# Patient Record
Sex: Male | Born: 1935
Health system: Southern US, Community
[De-identification: ages and names within clinical notes are randomized; demographics above are authoritative.]

## PROBLEM LIST (undated history)

## (undated) DIAGNOSIS — E7800 Pure hypercholesterolemia, unspecified: Secondary | ICD-10-CM

## (undated) DIAGNOSIS — F329 Major depressive disorder, single episode, unspecified: Secondary | ICD-10-CM

## (undated) DIAGNOSIS — R251 Tremor, unspecified: Secondary | ICD-10-CM

## (undated) DIAGNOSIS — N400 Enlarged prostate without lower urinary tract symptoms: Secondary | ICD-10-CM

## (undated) DIAGNOSIS — C61 Malignant neoplasm of prostate: Secondary | ICD-10-CM

## (undated) DIAGNOSIS — M1990 Unspecified osteoarthritis, unspecified site: Secondary | ICD-10-CM

## (undated) DIAGNOSIS — E785 Hyperlipidemia, unspecified: Secondary | ICD-10-CM

## (undated) DIAGNOSIS — F32A Depression, unspecified: Secondary | ICD-10-CM

## (undated) DIAGNOSIS — D126 Benign neoplasm of colon, unspecified: Secondary | ICD-10-CM

## (undated) DIAGNOSIS — F419 Anxiety disorder, unspecified: Secondary | ICD-10-CM

## (undated) DIAGNOSIS — I480 Paroxysmal atrial fibrillation: Secondary | ICD-10-CM

## (undated) DIAGNOSIS — K5790 Diverticulosis of intestine, part unspecified, without perforation or abscess without bleeding: Secondary | ICD-10-CM

## (undated) DIAGNOSIS — G20 Parkinson's disease: Secondary | ICD-10-CM

## (undated) HISTORY — DX: Major depressive disorder, single episode, unspecified: F32.9

## (undated) HISTORY — DX: Diverticulosis of intestine, part unspecified, without perforation or abscess without bleeding: K57.90

## (undated) HISTORY — DX: Parkinson's disease: G20

## (undated) HISTORY — DX: Benign neoplasm of colon, unspecified: D12.6

## (undated) HISTORY — DX: Malignant neoplasm of prostate: C61

## (undated) HISTORY — DX: Tremor, unspecified: R25.1

## (undated) HISTORY — DX: Anxiety disorder, unspecified: F41.9

## (undated) HISTORY — PX: ATRIAL FIBRILLATION ABLATION: EP1191

## (undated) HISTORY — DX: Hyperlipidemia, unspecified: E78.5

## (undated) HISTORY — DX: Depression, unspecified: F32.A

## (undated) HISTORY — PX: TOTAL KNEE ARTHROPLASTY: SHX125

## (undated) HISTORY — DX: Paroxysmal atrial fibrillation: I48.0

## (undated) HISTORY — DX: Pure hypercholesterolemia, unspecified: E78.00

## (undated) HISTORY — DX: Benign prostatic hyperplasia without lower urinary tract symptoms: N40.0

## (undated) HISTORY — DX: Unspecified osteoarthritis, unspecified site: M19.90

---
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
## 1990-06-29 HISTORY — PX: HERNIA REPAIR: SHX51

## 1998-03-26 ENCOUNTER — Emergency Department (HOSPITAL_COMMUNITY): Admission: EM | Admit: 1998-03-26 | Discharge: 1998-03-26 | Payer: Self-pay | Admitting: Emergency Medicine

## 1999-01-27 ENCOUNTER — Ambulatory Visit (HOSPITAL_COMMUNITY): Admission: RE | Admit: 1999-01-27 | Discharge: 1999-01-27 | Payer: Self-pay | Admitting: Internal Medicine

## 2000-11-25 ENCOUNTER — Encounter: Payer: Self-pay | Admitting: Orthopaedic Surgery

## 2000-11-25 ENCOUNTER — Encounter: Admission: RE | Admit: 2000-11-25 | Discharge: 2000-11-25 | Payer: Self-pay | Admitting: Orthopaedic Surgery

## 2003-06-30 HISTORY — PX: CARDIAC CATHETERIZATION: SHX172

## 2009-04-05 ENCOUNTER — Emergency Department (HOSPITAL_BASED_OUTPATIENT_CLINIC_OR_DEPARTMENT_OTHER): Admission: EM | Admit: 2009-04-05 | Discharge: 2009-04-05 | Payer: Self-pay | Admitting: Emergency Medicine

## 2009-04-05 ENCOUNTER — Ambulatory Visit: Payer: Self-pay | Admitting: Diagnostic Radiology

## 2011-03-04 ENCOUNTER — Encounter: Payer: Self-pay | Admitting: Internal Medicine

## 2011-04-16 ENCOUNTER — Encounter: Payer: Self-pay | Admitting: Internal Medicine

## 2011-04-16 ENCOUNTER — Ambulatory Visit (INDEPENDENT_AMBULATORY_CARE_PROVIDER_SITE_OTHER): Payer: Medicare Other | Admitting: Internal Medicine

## 2011-04-16 VITALS — BP 132/64 | HR 76 | Ht 71.0 in | Wt 181.0 lb

## 2011-04-16 DIAGNOSIS — R159 Full incontinence of feces: Secondary | ICD-10-CM

## 2011-04-16 DIAGNOSIS — R198 Other specified symptoms and signs involving the digestive system and abdomen: Secondary | ICD-10-CM

## 2011-04-16 DIAGNOSIS — Z86010 Personal history of colonic polyps: Secondary | ICD-10-CM

## 2011-04-16 MED ORDER — PEG-KCL-NACL-NASULF-NA ASC-C 100 G PO SOLR: 1.0000 | Freq: Once | ORAL | Status: DC

## 2011-04-16 NOTE — Patient Instructions (Signed)
[facility] 04/23/11 9:00 am arrive at 8:00 am on 4th floor Moviprep sent to your pharmacy Colon brochure given for you to read

## 2011-04-16 NOTE — Progress Notes (Signed)
HISTORY OF PRESENT ILLNESS:  Nathan Stanley is a 77 y.o. male with the below listed medical history. He presents today regarding change in bowel habits and the need for surveillance colonoscopy. He underwent colonoscopy previously in November of 2003. He was found to have severe pandiverticulosis as well as an adenomatous descending colon polyp which was removed. Recall letter sent to the patient in 2006. No response. He subsequently developed prostate cancer for which he underwent seed implantation. Thereafter, he reports alternating bowel habits consisting of constipation greater than diarrhea. She also reports intermittent fecal incontinence with mild smears. This occurs with Valsalva maneuver such as sneezing. He recently saw his primary care physician and inquired about his need for followup colonoscopy. The patient was interested. Despite his age and medical history, he remains quite active. He continues to work periodically, though formally retired.  REVIEW OF SYSTEMS:  All non-GI ROS negative except for occasional insomnia  Past Medical History  Diagnosis Date  . Prostate cancer   . Hyperlipidemia   . Osteoarthritis   . PAF (paroxysmal atrial fibrillation)   . BPH (benign prostatic hyperplasia)   . Hypercholesterolemia   . Osteoporosis   . Diverticulosis   . Adenomatous colon polyp   . Anxiety   . Depression   . Tremor     Past Surgical History  Procedure Date  . Hernia repair   . Amputation finger / thumb   . Total knee arthroplasty     left  . Total knee arthroplasty     rt    Social History Nathan Stanley  reports that he has never smoked. He has never used smokeless tobacco. He reports that he does not drink alcohol or use illicit drugs.  family history includes Lupus in his brother.  There is no history of Colon cancer.  No Known Allergies     PHYSICAL EXAMINATION: Vital signs: BP 132/64  Pulse 76  Ht 5\' 11"  (1.803 m)  Wt 181 lb (82.101 kg)  BMI 25.24 kg/m2    Constitutional: generally well-appearing, no acute distress Psychiatric: alert and oriented x3, cooperative Eyes: extraocular movements intact, anicteric, conjunctiva pink Mouth: oral pharynx moist, no lesions Neck: supple no lymphadenopathy Cardiovascular: heart regular rate and rhythm, no murmur Lungs: clear to auscultation bilaterally Abdomen: soft, nontender, nondistended, no obvious ascites, no peritoneal signs, normal bowel sounds, no organomegaly Rectal: Deferred until colonoscopy Extremities: no lower extremity edema bilaterally. Missing portion of right hand Skin: no lesions on visible extremities Neuro: No focal deficits.   ASSESSMENT:  #1. Mild fecal incontinence #2. Alternating bowel habits #3. History of adenomatous colon polyps. Overdue for surveillance #4. History of severe pandiverticulosis   PLAN:  #1. Colonoscopy to evaluate change in bowel habits, incontinence, and provide neoplasia surveillance. The patient is an appropriate candidate without contraindication.The nature of the procedure, as well as the risks, benefits, and alternatives were carefully and thoroughly reviewed with the patient. Ample time for discussion and questions allowed. The patient understood, was satisfied, and agreed to proceed. Movi prep prescribed. The patient instructed on its use. #2. If colonoscopy unrevealing for significant pathology, recommend bulking agents such as Metamucil or Citrucel. As well, protective undergarments (already recommended today)

## 2011-04-20 ENCOUNTER — Other Ambulatory Visit: Payer: Self-pay | Admitting: Diagnostic Neuroimaging

## 2011-04-20 DIAGNOSIS — G20 Parkinson's disease: Secondary | ICD-10-CM

## 2011-04-23 ENCOUNTER — Ambulatory Visit (AMBULATORY_SURGERY_CENTER): Payer: Medicare Other | Admitting: Internal Medicine

## 2011-04-23 ENCOUNTER — Encounter: Payer: Self-pay | Admitting: Internal Medicine

## 2011-04-23 VITALS — BP 128/71 | HR 74 | Temp 98.4°F | Resp 20 | Ht 71.0 in | Wt 181.0 lb

## 2011-04-23 DIAGNOSIS — Z1211 Encounter for screening for malignant neoplasm of colon: Secondary | ICD-10-CM

## 2011-04-23 DIAGNOSIS — Z86010 Personal history of colonic polyps: Secondary | ICD-10-CM

## 2011-04-23 DIAGNOSIS — K5730 Diverticulosis of large intestine without perforation or abscess without bleeding: Secondary | ICD-10-CM

## 2011-04-23 DIAGNOSIS — D126 Benign neoplasm of colon, unspecified: Secondary | ICD-10-CM

## 2011-04-23 MED ORDER — SODIUM CHLORIDE 0.9 % IV SOLN: 500.0000 mL | INTRAVENOUS | Status: DC

## 2011-04-23 NOTE — Progress Notes (Signed)
Initial bp 69/31 then recheck 76/31. Pt will respond to stimulation, sat 90% so started 02 at 2l via nasal cannula, skin warm and dry, no diaphoresis. Strong palpable pulse. Second bag ivf hung and pt resting comfortably. Denies any pain or discomfort. ewm  1036am pt positioned on his back and HOB elevated about 10 degrees and bp 93/52. Pt continues on 02 2liters via nasal cannula and still very drowsy. ewm  1051am bp 106/61 and more alert. pts head elevated slightly and more talkative to wife. Asking questions about eating and denies pain/discomfort. ewm  1100am dr Marina Goodell made aware of bp and no new orders given ewm  1110 am pts hob elevated more. Pt alert, drinking sprite and talkative .  bp maintaining in the 90's diastolic ewm  1135am dr Marina Goodell states pt may be discharged if supine and not dizzy. Pt elevated all the way and denies diazziness or pain/discomfort. ewm  1148 pt sat on side of bed with no problems. Denies dizziness or problems. Pt to dress to be discharged. ewm  1158am pt discharged with no problems or concerns,ewm

## 2011-04-23 NOTE — Patient Instructions (Signed)
Handouts given on polyps, diverticulosis, and high fiber diet  Discharge instructions per blue and green sheets  Start metamucil 2 tablespoons in 12-14 ounces water or juice once a day  We will mail you a letter in 1-2 weeks with the pathology results and dr perry's recommendations

## 2011-04-24 ENCOUNTER — Telehealth: Payer: Self-pay

## 2011-04-24 ENCOUNTER — Telehealth: Payer: Self-pay | Admitting: *Deleted

## 2011-04-24 NOTE — Telephone Encounter (Signed)
No answer

## 2011-04-24 NOTE — Telephone Encounter (Signed)
No ID on answering machine. 

## 2011-04-25 ENCOUNTER — Other Ambulatory Visit: Payer: Medicare Other

## 2011-07-03 DIAGNOSIS — J029 Acute pharyngitis, unspecified: Secondary | ICD-10-CM | POA: Diagnosis not present

## 2011-08-24 DIAGNOSIS — M546 Pain in thoracic spine: Secondary | ICD-10-CM | POA: Diagnosis not present

## 2011-08-24 DIAGNOSIS — M533 Sacrococcygeal disorders, not elsewhere classified: Secondary | ICD-10-CM | POA: Diagnosis not present

## 2011-08-24 DIAGNOSIS — M545 Low back pain: Secondary | ICD-10-CM | POA: Diagnosis not present

## 2011-08-24 DIAGNOSIS — M542 Cervicalgia: Secondary | ICD-10-CM | POA: Diagnosis not present

## 2011-10-12 DIAGNOSIS — M999 Biomechanical lesion, unspecified: Secondary | ICD-10-CM | POA: Diagnosis not present

## 2011-10-12 DIAGNOSIS — M5137 Other intervertebral disc degeneration, lumbosacral region: Secondary | ICD-10-CM | POA: Diagnosis not present

## 2011-10-12 DIAGNOSIS — M9981 Other biomechanical lesions of cervical region: Secondary | ICD-10-CM | POA: Diagnosis not present

## 2011-10-20 DIAGNOSIS — I4891 Unspecified atrial fibrillation: Secondary | ICD-10-CM | POA: Diagnosis not present

## 2011-10-20 DIAGNOSIS — M1990 Unspecified osteoarthritis, unspecified site: Secondary | ICD-10-CM | POA: Diagnosis not present

## 2011-10-20 DIAGNOSIS — E039 Hypothyroidism, unspecified: Secondary | ICD-10-CM | POA: Diagnosis not present

## 2011-10-20 DIAGNOSIS — R7301 Impaired fasting glucose: Secondary | ICD-10-CM | POA: Diagnosis not present

## 2011-11-09 DIAGNOSIS — M5137 Other intervertebral disc degeneration, lumbosacral region: Secondary | ICD-10-CM | POA: Diagnosis not present

## 2011-11-09 DIAGNOSIS — M999 Biomechanical lesion, unspecified: Secondary | ICD-10-CM | POA: Diagnosis not present

## 2011-11-09 DIAGNOSIS — M545 Low back pain: Secondary | ICD-10-CM | POA: Diagnosis not present

## 2011-12-08 DIAGNOSIS — M5137 Other intervertebral disc degeneration, lumbosacral region: Secondary | ICD-10-CM | POA: Diagnosis not present

## 2011-12-08 DIAGNOSIS — M545 Low back pain: Secondary | ICD-10-CM | POA: Diagnosis not present

## 2011-12-08 DIAGNOSIS — M999 Biomechanical lesion, unspecified: Secondary | ICD-10-CM | POA: Diagnosis not present

## 2011-12-14 DIAGNOSIS — Z7982 Long term (current) use of aspirin: Secondary | ICD-10-CM | POA: Diagnosis not present

## 2011-12-14 DIAGNOSIS — G250 Essential tremor: Secondary | ICD-10-CM | POA: Diagnosis not present

## 2011-12-14 DIAGNOSIS — I10 Essential (primary) hypertension: Secondary | ICD-10-CM | POA: Diagnosis not present

## 2011-12-14 DIAGNOSIS — Z79899 Other long term (current) drug therapy: Secondary | ICD-10-CM | POA: Diagnosis not present

## 2011-12-14 DIAGNOSIS — R079 Chest pain, unspecified: Secondary | ICD-10-CM | POA: Diagnosis not present

## 2011-12-14 DIAGNOSIS — I4891 Unspecified atrial fibrillation: Secondary | ICD-10-CM | POA: Diagnosis not present

## 2011-12-14 DIAGNOSIS — M129 Arthropathy, unspecified: Secondary | ICD-10-CM | POA: Diagnosis not present

## 2011-12-14 DIAGNOSIS — R002 Palpitations: Secondary | ICD-10-CM | POA: Diagnosis not present

## 2011-12-15 DIAGNOSIS — I369 Nonrheumatic tricuspid valve disorder, unspecified: Secondary | ICD-10-CM | POA: Diagnosis not present

## 2011-12-15 DIAGNOSIS — G252 Other specified forms of tremor: Secondary | ICD-10-CM | POA: Diagnosis not present

## 2011-12-15 DIAGNOSIS — I359 Nonrheumatic aortic valve disorder, unspecified: Secondary | ICD-10-CM | POA: Diagnosis not present

## 2011-12-15 DIAGNOSIS — M129 Arthropathy, unspecified: Secondary | ICD-10-CM | POA: Diagnosis not present

## 2011-12-15 DIAGNOSIS — Z79899 Other long term (current) drug therapy: Secondary | ICD-10-CM | POA: Diagnosis not present

## 2011-12-15 DIAGNOSIS — Z7982 Long term (current) use of aspirin: Secondary | ICD-10-CM | POA: Diagnosis not present

## 2011-12-15 DIAGNOSIS — I4891 Unspecified atrial fibrillation: Secondary | ICD-10-CM | POA: Diagnosis not present

## 2011-12-15 DIAGNOSIS — I10 Essential (primary) hypertension: Secondary | ICD-10-CM | POA: Diagnosis not present

## 2011-12-15 DIAGNOSIS — I517 Cardiomegaly: Secondary | ICD-10-CM | POA: Diagnosis not present

## 2012-01-05 DIAGNOSIS — M5137 Other intervertebral disc degeneration, lumbosacral region: Secondary | ICD-10-CM | POA: Diagnosis not present

## 2012-01-05 DIAGNOSIS — M999 Biomechanical lesion, unspecified: Secondary | ICD-10-CM | POA: Diagnosis not present

## 2012-01-05 DIAGNOSIS — M545 Low back pain: Secondary | ICD-10-CM | POA: Diagnosis not present

## 2012-01-14 DIAGNOSIS — E785 Hyperlipidemia, unspecified: Secondary | ICD-10-CM | POA: Diagnosis not present

## 2012-01-14 DIAGNOSIS — E039 Hypothyroidism, unspecified: Secondary | ICD-10-CM | POA: Diagnosis not present

## 2012-01-14 DIAGNOSIS — R000 Tachycardia, unspecified: Secondary | ICD-10-CM | POA: Diagnosis not present

## 2012-01-14 DIAGNOSIS — I4891 Unspecified atrial fibrillation: Secondary | ICD-10-CM | POA: Diagnosis not present

## 2012-01-18 ENCOUNTER — Encounter: Payer: Self-pay | Admitting: Cardiovascular Disease

## 2012-01-18 ENCOUNTER — Ambulatory Visit (INDEPENDENT_AMBULATORY_CARE_PROVIDER_SITE_OTHER): Payer: Medicare Other | Admitting: Cardiovascular Disease

## 2012-01-18 VITALS — BP 140/68 | HR 76 | Ht 72.0 in | Wt 186.0 lb

## 2012-01-18 DIAGNOSIS — I4891 Unspecified atrial fibrillation: Secondary | ICD-10-CM | POA: Diagnosis not present

## 2012-01-18 MED ORDER — METOPROLOL TARTRATE 25 MG PO TABS: ORAL_TABLET | ORAL | Status: DC

## 2012-01-18 NOTE — Patient Instructions (Signed)
Your physician has requested that you have an echocardiogram. Echocardiography is a painless test that uses sound waves to create images of your heart. It provides your doctor with information about the size and shape of your heart and how well your heart's chambers and valves are working. This procedure takes approximately one hour. There are no restrictions for this procedure.  Return to see Dr.Cooper in 6 weeks.

## 2012-01-19 ENCOUNTER — Encounter: Payer: Self-pay | Admitting: Cardiovascular Disease

## 2012-01-19 ENCOUNTER — Ambulatory Visit (HOSPITAL_COMMUNITY): Payer: Medicare Other | Attending: Cardiovascular Disease | Admitting: Radiology

## 2012-01-19 DIAGNOSIS — E785 Hyperlipidemia, unspecified: Secondary | ICD-10-CM | POA: Diagnosis not present

## 2012-01-19 DIAGNOSIS — I4891 Unspecified atrial fibrillation: Secondary | ICD-10-CM | POA: Diagnosis not present

## 2012-01-19 DIAGNOSIS — I079 Rheumatic tricuspid valve disease, unspecified: Secondary | ICD-10-CM | POA: Diagnosis not present

## 2012-01-19 NOTE — Progress Notes (Signed)
Echocardiogram performed.  

## 2012-01-26 ENCOUNTER — Encounter: Payer: Self-pay | Admitting: Cardiovascular Disease

## 2012-01-26 DIAGNOSIS — I4891 Unspecified atrial fibrillation: Secondary | ICD-10-CM | POA: Insufficient documentation

## 2012-01-26 NOTE — Assessment & Plan Note (Signed)
By history the patient has atrial fibrillation. We had a discussion of the natural history of A. fib. To this point his atrial fibrillation has been paroxysmal, but the frequency is increasing. We will give him a prescription for metoprolol to be used as needed. He prefers not to be on regular medication if possible. I've asked him to continue on aspirin for now. His chads score equals 1 for age greater than 19. He will have an echocardiogram to rule out LV dysfunction and to assess atrial size. I have requested his records from [facility] so that his rhythm strips and EKGs can be reviewed. I would like to see him back in followup in several weeks.

## 2012-01-26 NOTE — Progress Notes (Signed)
HPI:  78 year old gentleman presenting for initial cardiac evaluation. The patient has been diagnosed with paroxysmal atrial fibrillation. He has had rare episodes of atrial fibrillation dating back several years. In the past this has caused symptoms less than once per year. However, he has had increased frequency of "heart racing "and palpitations. He's had no CAD chest pain or pressure. He does feel weak when his palpitations occur. He's had 2 separate episodes that have been prolonged within the past month. He required observation and [facility] in by his report it sounds that he spontaneously converted back to sinus rhythm on IV diltiazem. He has never required electrical cardioversion. He has no history of coronary artery disease or chest pain. He denies dyspnea with exertion, edema, or syncope. The patient has no history of congestive heart failure. He's been relatively healthy for most of his life and Parkinson's disease as his only significant medical problem. He presents today to establish cardiac care and discuss treatment options and anticoagulation for atrial fibrillation.  Outpatient Encounter Prescriptions as of 01/18/2012  Medication Sig Dispense Refill  . ALPRAZolam (XANAX) 0.25 MG tablet Take 0.25 mg by mouth as needed.        . carbidopa-levodopa (SINEMET) 25-100 MG per tablet Take 1 tablet by mouth 3 (three) times daily.       Marland Kitchen levothyroxine (LEVOTHROID) 25 MCG tablet Take 25 mcg by mouth daily.      . meloxicam (MOBIC) 15 MG tablet Take 15 mg by mouth daily.      Marland Kitchen aspirin EC 81 MG tablet Take 1 tablet (81 mg total) by mouth daily.      . metoprolol tartrate (LOPRESSOR) 25 MG tablet Take one tab every day as needed for palpitations  30 tablet  3    Review of patient's allergies indicates no known allergies.  Past Medical History  Diagnosis Date  . Prostate cancer   . Hyperlipidemia   . Osteoarthritis   . PAF (paroxysmal atrial fibrillation)   . BPH  (benign prostatic hyperplasia)   . Hypercholesterolemia   . Osteoporosis   . Diverticulosis   . Adenomatous colon polyp   . Anxiety   . Depression   . Tremor     Past Surgical History  Procedure Date  . Hernia repair   . Amputation finger / thumb   . Total knee arthroplasty     left  . Total knee arthroplasty     rt    History   Social History  . Marital Status: Married    Spouse Name: N/A    Number of Children: 2  . Years of Education: N/A   Occupational History  . storage business owner     Retired    Social History Main Topics  . Smoking status: Never Smoker   . Smokeless tobacco: Never Used  . Alcohol Use: No  . Drug Use: No  . Sexually Active: Not on file   Other Topics Concern  . Not on file   Social History Narrative   1-2 caffeine drinks daily     Family History  Problem Relation Age of Onset  . Lupus Brother   . Colon cancer Neg Hx    ROS: General: no fevers/chills/night sweats Eyes: no blurry vision, diplopia, or amaurosis ENT: no sore throat or hearing loss Resp: no cough, wheezing, or hemoptysis CV: See history of present illness GI: no abdominal pain, nausea, vomiting, diarrhea, or constipation GU: no dysuria, frequency, or hematuria  Skin: no rash Neuro: no headache, numbness, tingling, or weakness of extremities. Positive for tremor related to Parkinson's disease Musculoskeletal: no joint pain or swelling Heme: no bleeding, DVT, or easy bruising Endo: no polydipsia or polyuria  BP 140/68  Pulse 76  Ht 6' (1.829 m)  Wt 186 lb (84.369 kg)  BMI 25.23 kg/m2  PHYSICAL EXAM: Pt is alert and oriented, WD, WN, in no distress. Pleasant elderly male with a resting tremor noted HEENT: normal Neck: JVP normal. Carotid upstrokes normal without bruits. No thyromegaly. Lungs: equal expansion, clear bilaterally CV: Apex is discrete and nondisplaced, RRR without murmur or gallop Abd: soft, NT, +BS, no bruit, no hepatosplenomegaly Back: no CVA  tenderness Ext: no C/C/E        Femoral pulses 2+= without bruits        DP/PT pulses intact and = Skin: warm and dry without rash Neuro: CNII-XII intact             Strength intact = bilaterally  EKG:  Sinus rhythm 73 beats per minute, nonspecific ST and T wave abnormality.  ASSESSMENT AND PLAN:

## 2012-02-02 DIAGNOSIS — M5137 Other intervertebral disc degeneration, lumbosacral region: Secondary | ICD-10-CM | POA: Diagnosis not present

## 2012-02-02 DIAGNOSIS — M999 Biomechanical lesion, unspecified: Secondary | ICD-10-CM | POA: Diagnosis not present

## 2012-02-02 DIAGNOSIS — M545 Low back pain: Secondary | ICD-10-CM | POA: Diagnosis not present

## 2012-02-26 DIAGNOSIS — E039 Hypothyroidism, unspecified: Secondary | ICD-10-CM | POA: Diagnosis not present

## 2012-02-26 DIAGNOSIS — E785 Hyperlipidemia, unspecified: Secondary | ICD-10-CM | POA: Diagnosis not present

## 2012-02-26 DIAGNOSIS — R7301 Impaired fasting glucose: Secondary | ICD-10-CM | POA: Diagnosis not present

## 2012-02-26 DIAGNOSIS — Z125 Encounter for screening for malignant neoplasm of prostate: Secondary | ICD-10-CM | POA: Diagnosis not present

## 2012-03-04 ENCOUNTER — Ambulatory Visit: Payer: Medicare Other | Admitting: Cardiovascular Disease

## 2012-03-09 DIAGNOSIS — M545 Low back pain: Secondary | ICD-10-CM | POA: Diagnosis not present

## 2012-03-09 DIAGNOSIS — M999 Biomechanical lesion, unspecified: Secondary | ICD-10-CM | POA: Diagnosis not present

## 2012-03-09 DIAGNOSIS — M5137 Other intervertebral disc degeneration, lumbosacral region: Secondary | ICD-10-CM | POA: Diagnosis not present

## 2012-03-10 DIAGNOSIS — Z125 Encounter for screening for malignant neoplasm of prostate: Secondary | ICD-10-CM | POA: Diagnosis not present

## 2012-03-10 DIAGNOSIS — E785 Hyperlipidemia, unspecified: Secondary | ICD-10-CM | POA: Diagnosis not present

## 2012-03-10 DIAGNOSIS — Z0000 Encounter for general adult medical examination without abnormal findings: Secondary | ICD-10-CM | POA: Diagnosis not present

## 2012-03-10 DIAGNOSIS — I4891 Unspecified atrial fibrillation: Secondary | ICD-10-CM | POA: Diagnosis not present

## 2012-03-10 DIAGNOSIS — E039 Hypothyroidism, unspecified: Secondary | ICD-10-CM | POA: Diagnosis not present

## 2012-03-10 DIAGNOSIS — Z23 Encounter for immunization: Secondary | ICD-10-CM | POA: Diagnosis not present

## 2012-04-06 DIAGNOSIS — M545 Low back pain: Secondary | ICD-10-CM | POA: Diagnosis not present

## 2012-04-06 DIAGNOSIS — M999 Biomechanical lesion, unspecified: Secondary | ICD-10-CM | POA: Diagnosis not present

## 2012-04-06 DIAGNOSIS — M5137 Other intervertebral disc degeneration, lumbosacral region: Secondary | ICD-10-CM | POA: Diagnosis not present

## 2012-04-25 DIAGNOSIS — M999 Biomechanical lesion, unspecified: Secondary | ICD-10-CM | POA: Diagnosis not present

## 2012-04-25 DIAGNOSIS — M545 Low back pain: Secondary | ICD-10-CM | POA: Diagnosis not present

## 2012-04-25 DIAGNOSIS — M7710 Lateral epicondylitis, unspecified elbow: Secondary | ICD-10-CM | POA: Diagnosis not present

## 2012-04-25 DIAGNOSIS — M5137 Other intervertebral disc degeneration, lumbosacral region: Secondary | ICD-10-CM | POA: Diagnosis not present

## 2012-05-05 DIAGNOSIS — M546 Pain in thoracic spine: Secondary | ICD-10-CM | POA: Diagnosis not present

## 2012-05-05 DIAGNOSIS — M533 Sacrococcygeal disorders, not elsewhere classified: Secondary | ICD-10-CM | POA: Diagnosis not present

## 2012-05-05 DIAGNOSIS — M542 Cervicalgia: Secondary | ICD-10-CM | POA: Diagnosis not present

## 2012-05-05 DIAGNOSIS — M545 Low back pain: Secondary | ICD-10-CM | POA: Diagnosis not present

## 2012-06-13 DIAGNOSIS — H6120 Impacted cerumen, unspecified ear: Secondary | ICD-10-CM | POA: Diagnosis not present

## 2012-11-02 DIAGNOSIS — M5137 Other intervertebral disc degeneration, lumbosacral region: Secondary | ICD-10-CM | POA: Diagnosis not present

## 2012-11-02 DIAGNOSIS — M545 Low back pain: Secondary | ICD-10-CM | POA: Diagnosis not present

## 2012-11-02 DIAGNOSIS — M999 Biomechanical lesion, unspecified: Secondary | ICD-10-CM | POA: Diagnosis not present

## 2012-11-03 DIAGNOSIS — H6120 Impacted cerumen, unspecified ear: Secondary | ICD-10-CM | POA: Diagnosis not present

## 2012-11-28 DIAGNOSIS — M999 Biomechanical lesion, unspecified: Secondary | ICD-10-CM | POA: Diagnosis not present

## 2012-11-28 DIAGNOSIS — M545 Low back pain: Secondary | ICD-10-CM | POA: Diagnosis not present

## 2012-11-28 DIAGNOSIS — M5137 Other intervertebral disc degeneration, lumbosacral region: Secondary | ICD-10-CM | POA: Diagnosis not present

## 2012-12-20 DIAGNOSIS — M5137 Other intervertebral disc degeneration, lumbosacral region: Secondary | ICD-10-CM | POA: Diagnosis not present

## 2012-12-20 DIAGNOSIS — M999 Biomechanical lesion, unspecified: Secondary | ICD-10-CM | POA: Diagnosis not present

## 2013-01-11 DIAGNOSIS — M999 Biomechanical lesion, unspecified: Secondary | ICD-10-CM | POA: Diagnosis not present

## 2013-01-11 DIAGNOSIS — M5137 Other intervertebral disc degeneration, lumbosacral region: Secondary | ICD-10-CM | POA: Diagnosis not present

## 2013-01-31 DIAGNOSIS — M999 Biomechanical lesion, unspecified: Secondary | ICD-10-CM | POA: Diagnosis not present

## 2013-01-31 DIAGNOSIS — M5137 Other intervertebral disc degeneration, lumbosacral region: Secondary | ICD-10-CM | POA: Diagnosis not present

## 2013-02-15 DIAGNOSIS — M999 Biomechanical lesion, unspecified: Secondary | ICD-10-CM | POA: Diagnosis not present

## 2013-02-15 DIAGNOSIS — M5137 Other intervertebral disc degeneration, lumbosacral region: Secondary | ICD-10-CM | POA: Diagnosis not present

## 2013-03-01 DIAGNOSIS — I369 Nonrheumatic tricuspid valve disorder, unspecified: Secondary | ICD-10-CM | POA: Diagnosis not present

## 2013-03-01 DIAGNOSIS — I270 Primary pulmonary hypertension: Secondary | ICD-10-CM | POA: Diagnosis not present

## 2013-03-01 DIAGNOSIS — I4892 Unspecified atrial flutter: Secondary | ICD-10-CM | POA: Diagnosis not present

## 2013-03-01 DIAGNOSIS — R079 Chest pain, unspecified: Secondary | ICD-10-CM | POA: Diagnosis not present

## 2013-03-01 DIAGNOSIS — G250 Essential tremor: Secondary | ICD-10-CM | POA: Diagnosis not present

## 2013-03-01 DIAGNOSIS — R002 Palpitations: Secondary | ICD-10-CM | POA: Diagnosis not present

## 2013-03-01 DIAGNOSIS — I4891 Unspecified atrial fibrillation: Secondary | ICD-10-CM | POA: Diagnosis not present

## 2013-03-01 DIAGNOSIS — Z96659 Presence of unspecified artificial knee joint: Secondary | ICD-10-CM | POA: Diagnosis not present

## 2013-03-01 DIAGNOSIS — Z79899 Other long term (current) drug therapy: Secondary | ICD-10-CM | POA: Diagnosis not present

## 2013-03-02 DIAGNOSIS — I4891 Unspecified atrial fibrillation: Secondary | ICD-10-CM | POA: Diagnosis not present

## 2013-03-02 DIAGNOSIS — G250 Essential tremor: Secondary | ICD-10-CM | POA: Diagnosis not present

## 2013-03-02 DIAGNOSIS — I4892 Unspecified atrial flutter: Secondary | ICD-10-CM | POA: Diagnosis not present

## 2013-03-05 DIAGNOSIS — R079 Chest pain, unspecified: Secondary | ICD-10-CM | POA: Diagnosis not present

## 2013-03-08 DIAGNOSIS — R7301 Impaired fasting glucose: Secondary | ICD-10-CM | POA: Diagnosis not present

## 2013-03-08 DIAGNOSIS — I4892 Unspecified atrial flutter: Secondary | ICD-10-CM | POA: Diagnosis not present

## 2013-03-08 DIAGNOSIS — R000 Tachycardia, unspecified: Secondary | ICD-10-CM | POA: Diagnosis not present

## 2013-03-08 DIAGNOSIS — R002 Palpitations: Secondary | ICD-10-CM | POA: Diagnosis not present

## 2013-03-08 DIAGNOSIS — E039 Hypothyroidism, unspecified: Secondary | ICD-10-CM | POA: Diagnosis not present

## 2013-03-08 DIAGNOSIS — I4891 Unspecified atrial fibrillation: Secondary | ICD-10-CM | POA: Diagnosis not present

## 2013-03-08 DIAGNOSIS — Z125 Encounter for screening for malignant neoplasm of prostate: Secondary | ICD-10-CM | POA: Diagnosis not present

## 2013-03-08 DIAGNOSIS — Z0000 Encounter for general adult medical examination without abnormal findings: Secondary | ICD-10-CM | POA: Diagnosis not present

## 2013-03-09 DIAGNOSIS — M999 Biomechanical lesion, unspecified: Secondary | ICD-10-CM | POA: Diagnosis not present

## 2013-03-09 DIAGNOSIS — M5137 Other intervertebral disc degeneration, lumbosacral region: Secondary | ICD-10-CM | POA: Diagnosis not present

## 2013-03-15 DIAGNOSIS — E785 Hyperlipidemia, unspecified: Secondary | ICD-10-CM | POA: Diagnosis not present

## 2013-03-15 DIAGNOSIS — G20 Parkinson's disease: Secondary | ICD-10-CM | POA: Diagnosis not present

## 2013-03-15 DIAGNOSIS — R7301 Impaired fasting glucose: Secondary | ICD-10-CM | POA: Diagnosis not present

## 2013-03-15 DIAGNOSIS — M1990 Unspecified osteoarthritis, unspecified site: Secondary | ICD-10-CM | POA: Diagnosis not present

## 2013-03-15 DIAGNOSIS — E039 Hypothyroidism, unspecified: Secondary | ICD-10-CM | POA: Diagnosis not present

## 2013-03-15 DIAGNOSIS — C61 Malignant neoplasm of prostate: Secondary | ICD-10-CM | POA: Diagnosis not present

## 2013-03-15 DIAGNOSIS — I4891 Unspecified atrial fibrillation: Secondary | ICD-10-CM | POA: Diagnosis not present

## 2013-03-15 DIAGNOSIS — Z0000 Encounter for general adult medical examination without abnormal findings: Secondary | ICD-10-CM | POA: Diagnosis not present

## 2013-03-22 ENCOUNTER — Other Ambulatory Visit: Payer: Self-pay | Admitting: Cardiovascular Disease

## 2013-03-27 DIAGNOSIS — M999 Biomechanical lesion, unspecified: Secondary | ICD-10-CM | POA: Diagnosis not present

## 2013-03-27 DIAGNOSIS — M5137 Other intervertebral disc degeneration, lumbosacral region: Secondary | ICD-10-CM | POA: Diagnosis not present

## 2013-04-06 DIAGNOSIS — I4891 Unspecified atrial fibrillation: Secondary | ICD-10-CM | POA: Diagnosis not present

## 2013-04-06 DIAGNOSIS — I4892 Unspecified atrial flutter: Secondary | ICD-10-CM | POA: Diagnosis not present

## 2013-04-10 DIAGNOSIS — M5137 Other intervertebral disc degeneration, lumbosacral region: Secondary | ICD-10-CM | POA: Diagnosis not present

## 2013-04-10 DIAGNOSIS — M999 Biomechanical lesion, unspecified: Secondary | ICD-10-CM | POA: Diagnosis not present

## 2013-04-11 DIAGNOSIS — I4892 Unspecified atrial flutter: Secondary | ICD-10-CM | POA: Diagnosis not present

## 2013-04-20 DIAGNOSIS — M999 Biomechanical lesion, unspecified: Secondary | ICD-10-CM | POA: Diagnosis not present

## 2013-04-20 DIAGNOSIS — M5137 Other intervertebral disc degeneration, lumbosacral region: Secondary | ICD-10-CM | POA: Diagnosis not present

## 2013-04-24 DIAGNOSIS — I10 Essential (primary) hypertension: Secondary | ICD-10-CM | POA: Diagnosis not present

## 2013-04-24 DIAGNOSIS — I519 Heart disease, unspecified: Secondary | ICD-10-CM | POA: Diagnosis not present

## 2013-04-24 DIAGNOSIS — Z7901 Long term (current) use of anticoagulants: Secondary | ICD-10-CM | POA: Diagnosis not present

## 2013-04-24 DIAGNOSIS — I4892 Unspecified atrial flutter: Secondary | ICD-10-CM | POA: Diagnosis not present

## 2013-04-24 DIAGNOSIS — M1990 Unspecified osteoarthritis, unspecified site: Secondary | ICD-10-CM | POA: Diagnosis not present

## 2013-04-24 DIAGNOSIS — Z79899 Other long term (current) drug therapy: Secondary | ICD-10-CM | POA: Diagnosis not present

## 2013-04-24 DIAGNOSIS — Z8546 Personal history of malignant neoplasm of prostate: Secondary | ICD-10-CM | POA: Diagnosis not present

## 2013-04-24 DIAGNOSIS — G20 Parkinson's disease: Secondary | ICD-10-CM | POA: Diagnosis not present

## 2013-04-24 DIAGNOSIS — I4891 Unspecified atrial fibrillation: Secondary | ICD-10-CM | POA: Diagnosis not present

## 2013-04-26 DIAGNOSIS — Z7901 Long term (current) use of anticoagulants: Secondary | ICD-10-CM | POA: Diagnosis not present

## 2013-04-26 DIAGNOSIS — M1990 Unspecified osteoarthritis, unspecified site: Secondary | ICD-10-CM | POA: Diagnosis not present

## 2013-04-26 DIAGNOSIS — I4891 Unspecified atrial fibrillation: Secondary | ICD-10-CM | POA: Diagnosis not present

## 2013-04-26 DIAGNOSIS — G20 Parkinson's disease: Secondary | ICD-10-CM | POA: Diagnosis not present

## 2013-04-26 DIAGNOSIS — I4892 Unspecified atrial flutter: Secondary | ICD-10-CM | POA: Diagnosis not present

## 2013-04-26 DIAGNOSIS — I10 Essential (primary) hypertension: Secondary | ICD-10-CM | POA: Diagnosis not present

## 2013-04-26 DIAGNOSIS — Z79899 Other long term (current) drug therapy: Secondary | ICD-10-CM | POA: Diagnosis not present

## 2013-04-26 DIAGNOSIS — Z8546 Personal history of malignant neoplasm of prostate: Secondary | ICD-10-CM | POA: Diagnosis not present

## 2013-04-27 DIAGNOSIS — G20 Parkinson's disease: Secondary | ICD-10-CM | POA: Diagnosis not present

## 2013-04-27 DIAGNOSIS — Z7901 Long term (current) use of anticoagulants: Secondary | ICD-10-CM | POA: Diagnosis not present

## 2013-04-27 DIAGNOSIS — Z79899 Other long term (current) drug therapy: Secondary | ICD-10-CM | POA: Diagnosis not present

## 2013-04-27 DIAGNOSIS — I10 Essential (primary) hypertension: Secondary | ICD-10-CM | POA: Diagnosis not present

## 2013-04-27 DIAGNOSIS — I4892 Unspecified atrial flutter: Secondary | ICD-10-CM | POA: Diagnosis not present

## 2013-04-27 DIAGNOSIS — I4891 Unspecified atrial fibrillation: Secondary | ICD-10-CM | POA: Diagnosis not present

## 2013-05-01 DIAGNOSIS — I4892 Unspecified atrial flutter: Secondary | ICD-10-CM | POA: Diagnosis not present

## 2013-05-01 DIAGNOSIS — I4891 Unspecified atrial fibrillation: Secondary | ICD-10-CM | POA: Diagnosis not present

## 2013-05-17 ENCOUNTER — Telehealth: Payer: Self-pay | Admitting: Diagnostic Neuroimaging

## 2013-05-18 MED ORDER — CARBIDOPA-LEVODOPA 25-100 MG PO TABS: 1.0000 | ORAL_TABLET | Freq: Three times a day (TID) | ORAL | Status: DC

## 2013-05-18 NOTE — Telephone Encounter (Signed)
One refill sent while patient is waiting to get new MD.  I spoke with spouse.  She is aware.

## 2013-06-05 DIAGNOSIS — M545 Low back pain: Secondary | ICD-10-CM | POA: Diagnosis not present

## 2013-06-05 DIAGNOSIS — M542 Cervicalgia: Secondary | ICD-10-CM | POA: Diagnosis not present

## 2013-06-05 DIAGNOSIS — M533 Sacrococcygeal disorders, not elsewhere classified: Secondary | ICD-10-CM | POA: Diagnosis not present

## 2013-06-05 DIAGNOSIS — M546 Pain in thoracic spine: Secondary | ICD-10-CM | POA: Diagnosis not present

## 2013-06-07 DIAGNOSIS — G20 Parkinson's disease: Secondary | ICD-10-CM | POA: Diagnosis not present

## 2013-06-08 DIAGNOSIS — M533 Sacrococcygeal disorders, not elsewhere classified: Secondary | ICD-10-CM | POA: Diagnosis not present

## 2013-06-08 DIAGNOSIS — M546 Pain in thoracic spine: Secondary | ICD-10-CM | POA: Diagnosis not present

## 2013-06-08 DIAGNOSIS — M542 Cervicalgia: Secondary | ICD-10-CM | POA: Diagnosis not present

## 2013-06-08 DIAGNOSIS — M545 Low back pain: Secondary | ICD-10-CM | POA: Diagnosis not present

## 2013-06-15 DIAGNOSIS — M533 Sacrococcygeal disorders, not elsewhere classified: Secondary | ICD-10-CM | POA: Diagnosis not present

## 2013-06-15 DIAGNOSIS — M545 Low back pain: Secondary | ICD-10-CM | POA: Diagnosis not present

## 2013-06-15 DIAGNOSIS — M546 Pain in thoracic spine: Secondary | ICD-10-CM | POA: Diagnosis not present

## 2013-06-15 DIAGNOSIS — M542 Cervicalgia: Secondary | ICD-10-CM | POA: Diagnosis not present

## 2013-07-03 DIAGNOSIS — M546 Pain in thoracic spine: Secondary | ICD-10-CM | POA: Diagnosis not present

## 2013-07-03 DIAGNOSIS — M542 Cervicalgia: Secondary | ICD-10-CM | POA: Diagnosis not present

## 2013-07-03 DIAGNOSIS — M533 Sacrococcygeal disorders, not elsewhere classified: Secondary | ICD-10-CM | POA: Diagnosis not present

## 2013-07-03 DIAGNOSIS — M545 Low back pain, unspecified: Secondary | ICD-10-CM | POA: Diagnosis not present

## 2013-07-12 DIAGNOSIS — M75120 Complete rotator cuff tear or rupture of unspecified shoulder, not specified as traumatic: Secondary | ICD-10-CM | POA: Diagnosis not present

## 2013-07-15 DIAGNOSIS — J0100 Acute maxillary sinusitis, unspecified: Secondary | ICD-10-CM | POA: Diagnosis not present

## 2013-07-15 DIAGNOSIS — R05 Cough: Secondary | ICD-10-CM | POA: Diagnosis not present

## 2013-07-15 DIAGNOSIS — R059 Cough, unspecified: Secondary | ICD-10-CM | POA: Diagnosis not present

## 2013-07-15 DIAGNOSIS — J029 Acute pharyngitis, unspecified: Secondary | ICD-10-CM | POA: Diagnosis not present

## 2013-07-22 DIAGNOSIS — J029 Acute pharyngitis, unspecified: Secondary | ICD-10-CM | POA: Diagnosis not present

## 2013-07-22 DIAGNOSIS — J209 Acute bronchitis, unspecified: Secondary | ICD-10-CM | POA: Diagnosis not present

## 2013-09-11 DIAGNOSIS — G20 Parkinson's disease: Secondary | ICD-10-CM | POA: Diagnosis not present

## 2013-09-12 DIAGNOSIS — M546 Pain in thoracic spine: Secondary | ICD-10-CM | POA: Diagnosis not present

## 2013-09-12 DIAGNOSIS — M545 Low back pain, unspecified: Secondary | ICD-10-CM | POA: Diagnosis not present

## 2013-09-12 DIAGNOSIS — M533 Sacrococcygeal disorders, not elsewhere classified: Secondary | ICD-10-CM | POA: Diagnosis not present

## 2013-09-12 DIAGNOSIS — M542 Cervicalgia: Secondary | ICD-10-CM | POA: Diagnosis not present

## 2013-10-10 DIAGNOSIS — M999 Biomechanical lesion, unspecified: Secondary | ICD-10-CM | POA: Diagnosis not present

## 2013-10-10 DIAGNOSIS — M5137 Other intervertebral disc degeneration, lumbosacral region: Secondary | ICD-10-CM | POA: Diagnosis not present

## 2013-10-17 DIAGNOSIS — M5137 Other intervertebral disc degeneration, lumbosacral region: Secondary | ICD-10-CM | POA: Diagnosis not present

## 2013-10-17 DIAGNOSIS — M999 Biomechanical lesion, unspecified: Secondary | ICD-10-CM | POA: Diagnosis not present

## 2013-11-01 DIAGNOSIS — M999 Biomechanical lesion, unspecified: Secondary | ICD-10-CM | POA: Diagnosis not present

## 2013-11-01 DIAGNOSIS — M5137 Other intervertebral disc degeneration, lumbosacral region: Secondary | ICD-10-CM | POA: Diagnosis not present

## 2013-11-07 DIAGNOSIS — D3130 Benign neoplasm of unspecified choroid: Secondary | ICD-10-CM | POA: Diagnosis not present

## 2013-11-07 DIAGNOSIS — H2510 Age-related nuclear cataract, unspecified eye: Secondary | ICD-10-CM | POA: Diagnosis not present

## 2013-11-28 DIAGNOSIS — I4892 Unspecified atrial flutter: Secondary | ICD-10-CM | POA: Diagnosis not present

## 2013-11-28 DIAGNOSIS — I4891 Unspecified atrial fibrillation: Secondary | ICD-10-CM | POA: Diagnosis not present

## 2013-11-29 DIAGNOSIS — M5137 Other intervertebral disc degeneration, lumbosacral region: Secondary | ICD-10-CM | POA: Diagnosis not present

## 2013-11-29 DIAGNOSIS — M999 Biomechanical lesion, unspecified: Secondary | ICD-10-CM | POA: Diagnosis not present

## 2013-12-01 DIAGNOSIS — F411 Generalized anxiety disorder: Secondary | ICD-10-CM | POA: Diagnosis not present

## 2013-12-01 DIAGNOSIS — R0602 Shortness of breath: Secondary | ICD-10-CM | POA: Diagnosis not present

## 2013-12-01 DIAGNOSIS — D691 Qualitative platelet defects: Secondary | ICD-10-CM | POA: Diagnosis not present

## 2013-12-01 DIAGNOSIS — Z79899 Other long term (current) drug therapy: Secondary | ICD-10-CM | POA: Diagnosis not present

## 2013-12-01 DIAGNOSIS — R944 Abnormal results of kidney function studies: Secondary | ICD-10-CM | POA: Diagnosis not present

## 2013-12-13 DIAGNOSIS — R259 Unspecified abnormal involuntary movements: Secondary | ICD-10-CM | POA: Diagnosis not present

## 2013-12-13 DIAGNOSIS — F341 Dysthymic disorder: Secondary | ICD-10-CM | POA: Diagnosis not present

## 2013-12-13 DIAGNOSIS — Z6825 Body mass index (BMI) 25.0-25.9, adult: Secondary | ICD-10-CM | POA: Diagnosis not present

## 2013-12-27 DIAGNOSIS — M999 Biomechanical lesion, unspecified: Secondary | ICD-10-CM | POA: Diagnosis not present

## 2013-12-27 DIAGNOSIS — M5137 Other intervertebral disc degeneration, lumbosacral region: Secondary | ICD-10-CM | POA: Diagnosis not present

## 2014-01-17 DIAGNOSIS — M999 Biomechanical lesion, unspecified: Secondary | ICD-10-CM | POA: Diagnosis not present

## 2014-01-17 DIAGNOSIS — M5137 Other intervertebral disc degeneration, lumbosacral region: Secondary | ICD-10-CM | POA: Diagnosis not present

## 2014-01-25 DIAGNOSIS — I4891 Unspecified atrial fibrillation: Secondary | ICD-10-CM | POA: Diagnosis not present

## 2014-01-27 DIAGNOSIS — I472 Ventricular tachycardia: Secondary | ICD-10-CM | POA: Diagnosis not present

## 2014-01-27 DIAGNOSIS — I4729 Other ventricular tachycardia: Secondary | ICD-10-CM | POA: Diagnosis not present

## 2014-01-27 DIAGNOSIS — I4949 Other premature depolarization: Secondary | ICD-10-CM | POA: Diagnosis not present

## 2014-01-27 DIAGNOSIS — I4891 Unspecified atrial fibrillation: Secondary | ICD-10-CM | POA: Diagnosis not present

## 2014-01-30 DIAGNOSIS — I4891 Unspecified atrial fibrillation: Secondary | ICD-10-CM | POA: Diagnosis not present

## 2014-01-30 DIAGNOSIS — I4892 Unspecified atrial flutter: Secondary | ICD-10-CM | POA: Diagnosis not present

## 2014-02-14 DIAGNOSIS — M5137 Other intervertebral disc degeneration, lumbosacral region: Secondary | ICD-10-CM | POA: Diagnosis not present

## 2014-02-14 DIAGNOSIS — M999 Biomechanical lesion, unspecified: Secondary | ICD-10-CM | POA: Diagnosis not present

## 2014-02-22 ENCOUNTER — Telehealth: Payer: Self-pay | Admitting: Diagnostic Neuroimaging

## 2014-02-22 NOTE — Telephone Encounter (Signed)
This patient has not been seen for almost 3 years.  I called back.  Nathan Stanley says they have been getting meds filled by MD in [place], but are now back in [place].  They will contact PCP to see if they will fill this med.  Will call us back if anything further is needed.

## 2014-02-22 NOTE — Telephone Encounter (Signed)
Patient has 2 days of medication left and is requesting refills of the Sinemet. He uses Paediatric nurse at [facility]. [facility] number to call back: [phone]

## 2014-03-13 DIAGNOSIS — R82998 Other abnormal findings in urine: Secondary | ICD-10-CM | POA: Diagnosis not present

## 2014-03-13 DIAGNOSIS — R7301 Impaired fasting glucose: Secondary | ICD-10-CM | POA: Diagnosis not present

## 2014-03-13 DIAGNOSIS — E039 Hypothyroidism, unspecified: Secondary | ICD-10-CM | POA: Diagnosis not present

## 2014-03-13 DIAGNOSIS — Z79899 Other long term (current) drug therapy: Secondary | ICD-10-CM | POA: Diagnosis not present

## 2014-03-13 DIAGNOSIS — Z125 Encounter for screening for malignant neoplasm of prostate: Secondary | ICD-10-CM | POA: Diagnosis not present

## 2014-03-14 DIAGNOSIS — M5030 Other cervical disc degeneration, unspecified cervical region: Secondary | ICD-10-CM | POA: Diagnosis not present

## 2014-03-14 DIAGNOSIS — M999 Biomechanical lesion, unspecified: Secondary | ICD-10-CM | POA: Diagnosis not present

## 2014-03-14 DIAGNOSIS — M5137 Other intervertebral disc degeneration, lumbosacral region: Secondary | ICD-10-CM | POA: Diagnosis not present

## 2014-03-19 DIAGNOSIS — Z23 Encounter for immunization: Secondary | ICD-10-CM | POA: Diagnosis not present

## 2014-03-19 DIAGNOSIS — M1990 Unspecified osteoarthritis, unspecified site: Secondary | ICD-10-CM | POA: Diagnosis not present

## 2014-03-19 DIAGNOSIS — IMO0002 Reserved for concepts with insufficient information to code with codable children: Secondary | ICD-10-CM | POA: Diagnosis not present

## 2014-03-19 DIAGNOSIS — I4891 Unspecified atrial fibrillation: Secondary | ICD-10-CM | POA: Diagnosis not present

## 2014-03-19 DIAGNOSIS — E785 Hyperlipidemia, unspecified: Secondary | ICD-10-CM | POA: Diagnosis not present

## 2014-03-19 DIAGNOSIS — Z0000 Encounter for general adult medical examination without abnormal findings: Secondary | ICD-10-CM | POA: Diagnosis not present

## 2014-03-19 DIAGNOSIS — E039 Hypothyroidism, unspecified: Secondary | ICD-10-CM | POA: Diagnosis not present

## 2014-03-19 DIAGNOSIS — G20 Parkinson's disease: Secondary | ICD-10-CM | POA: Diagnosis not present

## 2014-03-19 DIAGNOSIS — R7301 Impaired fasting glucose: Secondary | ICD-10-CM | POA: Diagnosis not present

## 2014-03-21 DIAGNOSIS — I4891 Unspecified atrial fibrillation: Secondary | ICD-10-CM | POA: Diagnosis not present

## 2014-03-21 DIAGNOSIS — L723 Sebaceous cyst: Secondary | ICD-10-CM | POA: Diagnosis not present

## 2014-03-21 DIAGNOSIS — L719 Rosacea, unspecified: Secondary | ICD-10-CM | POA: Diagnosis not present

## 2014-03-31 DIAGNOSIS — S0121XA Laceration without foreign body of nose, initial encounter: Secondary | ICD-10-CM | POA: Diagnosis not present

## 2014-03-31 DIAGNOSIS — I4891 Unspecified atrial fibrillation: Secondary | ICD-10-CM | POA: Diagnosis not present

## 2014-03-31 DIAGNOSIS — S022XXA Fracture of nasal bones, initial encounter for closed fracture: Secondary | ICD-10-CM | POA: Diagnosis not present

## 2014-03-31 DIAGNOSIS — S199XXA Unspecified injury of neck, initial encounter: Secondary | ICD-10-CM | POA: Diagnosis not present

## 2014-03-31 DIAGNOSIS — J3489 Other specified disorders of nose and nasal sinuses: Secondary | ICD-10-CM | POA: Diagnosis not present

## 2014-03-31 DIAGNOSIS — S0990XA Unspecified injury of head, initial encounter: Secondary | ICD-10-CM | POA: Diagnosis not present

## 2014-04-02 DIAGNOSIS — Z7901 Long term (current) use of anticoagulants: Secondary | ICD-10-CM | POA: Diagnosis not present

## 2014-04-02 DIAGNOSIS — Z79899 Other long term (current) drug therapy: Secondary | ICD-10-CM | POA: Diagnosis not present

## 2014-04-02 DIAGNOSIS — Z23 Encounter for immunization: Secondary | ICD-10-CM | POA: Diagnosis not present

## 2014-04-02 DIAGNOSIS — I4892 Unspecified atrial flutter: Secondary | ICD-10-CM | POA: Diagnosis not present

## 2014-04-02 DIAGNOSIS — Z96653 Presence of artificial knee joint, bilateral: Secondary | ICD-10-CM | POA: Diagnosis not present

## 2014-04-02 DIAGNOSIS — I480 Paroxysmal atrial fibrillation: Secondary | ICD-10-CM | POA: Diagnosis not present

## 2014-04-02 DIAGNOSIS — I10 Essential (primary) hypertension: Secondary | ICD-10-CM | POA: Diagnosis not present

## 2014-04-02 DIAGNOSIS — I2510 Atherosclerotic heart disease of native coronary artery without angina pectoris: Secondary | ICD-10-CM | POA: Diagnosis not present

## 2014-04-02 DIAGNOSIS — Z8546 Personal history of malignant neoplasm of prostate: Secondary | ICD-10-CM | POA: Diagnosis not present

## 2014-04-02 DIAGNOSIS — I4891 Unspecified atrial fibrillation: Secondary | ICD-10-CM | POA: Diagnosis not present

## 2014-04-02 DIAGNOSIS — Z01818 Encounter for other preprocedural examination: Secondary | ICD-10-CM | POA: Diagnosis not present

## 2014-04-04 DIAGNOSIS — I10 Essential (primary) hypertension: Secondary | ICD-10-CM | POA: Diagnosis not present

## 2014-04-04 DIAGNOSIS — I4892 Unspecified atrial flutter: Secondary | ICD-10-CM | POA: Diagnosis not present

## 2014-04-04 DIAGNOSIS — I480 Paroxysmal atrial fibrillation: Secondary | ICD-10-CM | POA: Diagnosis not present

## 2014-04-04 DIAGNOSIS — Z23 Encounter for immunization: Secondary | ICD-10-CM | POA: Diagnosis not present

## 2014-04-04 DIAGNOSIS — Z7901 Long term (current) use of anticoagulants: Secondary | ICD-10-CM | POA: Diagnosis not present

## 2014-04-04 DIAGNOSIS — I482 Chronic atrial fibrillation: Secondary | ICD-10-CM | POA: Diagnosis not present

## 2014-04-04 DIAGNOSIS — Z96653 Presence of artificial knee joint, bilateral: Secondary | ICD-10-CM | POA: Diagnosis not present

## 2014-04-04 DIAGNOSIS — Z79899 Other long term (current) drug therapy: Secondary | ICD-10-CM | POA: Diagnosis not present

## 2014-04-04 DIAGNOSIS — I4891 Unspecified atrial fibrillation: Secondary | ICD-10-CM | POA: Diagnosis not present

## 2014-04-04 DIAGNOSIS — Z8546 Personal history of malignant neoplasm of prostate: Secondary | ICD-10-CM | POA: Diagnosis not present

## 2014-04-05 DIAGNOSIS — I4891 Unspecified atrial fibrillation: Secondary | ICD-10-CM | POA: Diagnosis not present

## 2014-04-05 DIAGNOSIS — Z79899 Other long term (current) drug therapy: Secondary | ICD-10-CM | POA: Diagnosis not present

## 2014-04-05 DIAGNOSIS — Z23 Encounter for immunization: Secondary | ICD-10-CM | POA: Diagnosis not present

## 2014-04-05 DIAGNOSIS — I4892 Unspecified atrial flutter: Secondary | ICD-10-CM | POA: Diagnosis not present

## 2014-04-05 DIAGNOSIS — I482 Chronic atrial fibrillation: Secondary | ICD-10-CM | POA: Diagnosis not present

## 2014-04-05 DIAGNOSIS — I10 Essential (primary) hypertension: Secondary | ICD-10-CM | POA: Diagnosis not present

## 2014-04-05 DIAGNOSIS — Z7901 Long term (current) use of anticoagulants: Secondary | ICD-10-CM | POA: Diagnosis not present

## 2014-04-06 DIAGNOSIS — I482 Chronic atrial fibrillation: Secondary | ICD-10-CM | POA: Diagnosis not present

## 2014-04-18 DIAGNOSIS — M25561 Pain in right knee: Secondary | ICD-10-CM | POA: Diagnosis not present

## 2014-04-18 DIAGNOSIS — S6991XA Unspecified injury of right wrist, hand and finger(s), initial encounter: Secondary | ICD-10-CM | POA: Diagnosis not present

## 2014-04-18 DIAGNOSIS — M79641 Pain in right hand: Secondary | ICD-10-CM | POA: Diagnosis not present

## 2014-04-18 DIAGNOSIS — M7989 Other specified soft tissue disorders: Secondary | ICD-10-CM | POA: Diagnosis not present

## 2014-04-18 DIAGNOSIS — S8001XA Contusion of right knee, initial encounter: Secondary | ICD-10-CM | POA: Diagnosis not present

## 2014-04-18 DIAGNOSIS — S63064A Dislocation of metacarpal (bone), proximal end of right hand, initial encounter: Secondary | ICD-10-CM | POA: Diagnosis not present

## 2014-04-18 DIAGNOSIS — W108XXA Fall (on) (from) other stairs and steps, initial encounter: Secondary | ICD-10-CM | POA: Diagnosis not present

## 2014-04-18 DIAGNOSIS — S60221A Contusion of right hand, initial encounter: Secondary | ICD-10-CM | POA: Diagnosis not present

## 2014-04-24 DIAGNOSIS — I480 Paroxysmal atrial fibrillation: Secondary | ICD-10-CM | POA: Diagnosis not present

## 2014-04-24 DIAGNOSIS — Z6825 Body mass index (BMI) 25.0-25.9, adult: Secondary | ICD-10-CM | POA: Diagnosis not present

## 2014-04-24 DIAGNOSIS — S8001XA Contusion of right knee, initial encounter: Secondary | ICD-10-CM | POA: Diagnosis not present

## 2014-04-24 DIAGNOSIS — M25461 Effusion, right knee: Secondary | ICD-10-CM | POA: Diagnosis not present

## 2014-04-24 DIAGNOSIS — G20 Parkinson's disease: Secondary | ICD-10-CM | POA: Diagnosis not present

## 2014-04-26 DIAGNOSIS — M179 Osteoarthritis of knee, unspecified: Secondary | ICD-10-CM | POA: Diagnosis not present

## 2014-04-26 DIAGNOSIS — M25561 Pain in right knee: Secondary | ICD-10-CM | POA: Diagnosis not present

## 2014-05-03 DIAGNOSIS — I4891 Unspecified atrial fibrillation: Secondary | ICD-10-CM | POA: Diagnosis not present

## 2014-05-03 DIAGNOSIS — I4892 Unspecified atrial flutter: Secondary | ICD-10-CM | POA: Diagnosis not present

## 2014-05-04 DIAGNOSIS — S8001XD Contusion of right knee, subsequent encounter: Secondary | ICD-10-CM | POA: Diagnosis not present

## 2014-06-08 DIAGNOSIS — M542 Cervicalgia: Secondary | ICD-10-CM | POA: Diagnosis not present

## 2014-06-08 DIAGNOSIS — M546 Pain in thoracic spine: Secondary | ICD-10-CM | POA: Diagnosis not present

## 2014-06-08 DIAGNOSIS — M545 Low back pain: Secondary | ICD-10-CM | POA: Diagnosis not present

## 2014-06-12 DIAGNOSIS — M545 Low back pain: Secondary | ICD-10-CM | POA: Diagnosis not present

## 2014-06-12 DIAGNOSIS — M542 Cervicalgia: Secondary | ICD-10-CM | POA: Diagnosis not present

## 2014-06-12 DIAGNOSIS — M546 Pain in thoracic spine: Secondary | ICD-10-CM | POA: Diagnosis not present

## 2014-06-14 DIAGNOSIS — M545 Low back pain: Secondary | ICD-10-CM | POA: Diagnosis not present

## 2014-06-14 DIAGNOSIS — M542 Cervicalgia: Secondary | ICD-10-CM | POA: Diagnosis not present

## 2014-06-14 DIAGNOSIS — M546 Pain in thoracic spine: Secondary | ICD-10-CM | POA: Diagnosis not present

## 2014-06-25 DIAGNOSIS — G20 Parkinson's disease: Secondary | ICD-10-CM | POA: Diagnosis not present

## 2014-07-12 DIAGNOSIS — M546 Pain in thoracic spine: Secondary | ICD-10-CM | POA: Diagnosis not present

## 2014-07-12 DIAGNOSIS — M545 Low back pain: Secondary | ICD-10-CM | POA: Diagnosis not present

## 2014-07-12 DIAGNOSIS — M542 Cervicalgia: Secondary | ICD-10-CM | POA: Diagnosis not present

## 2014-07-15 DIAGNOSIS — Z7901 Long term (current) use of anticoagulants: Secondary | ICD-10-CM | POA: Diagnosis not present

## 2014-07-15 DIAGNOSIS — Z96659 Presence of unspecified artificial knee joint: Secondary | ICD-10-CM | POA: Diagnosis not present

## 2014-07-15 DIAGNOSIS — R040 Epistaxis: Secondary | ICD-10-CM | POA: Diagnosis not present

## 2014-07-15 DIAGNOSIS — R58 Hemorrhage, not elsewhere classified: Secondary | ICD-10-CM | POA: Diagnosis not present

## 2014-07-15 DIAGNOSIS — G20 Parkinson's disease: Secondary | ICD-10-CM | POA: Diagnosis not present

## 2014-07-15 DIAGNOSIS — E039 Hypothyroidism, unspecified: Secondary | ICD-10-CM | POA: Diagnosis not present

## 2014-07-15 DIAGNOSIS — Z9889 Other specified postprocedural states: Secondary | ICD-10-CM | POA: Diagnosis not present

## 2014-07-15 DIAGNOSIS — Z89029 Acquired absence of unspecified finger(s): Secondary | ICD-10-CM | POA: Diagnosis not present

## 2014-07-19 DIAGNOSIS — M546 Pain in thoracic spine: Secondary | ICD-10-CM | POA: Diagnosis not present

## 2014-07-19 DIAGNOSIS — M545 Low back pain: Secondary | ICD-10-CM | POA: Diagnosis not present

## 2014-07-19 DIAGNOSIS — M542 Cervicalgia: Secondary | ICD-10-CM | POA: Diagnosis not present

## 2014-07-23 DIAGNOSIS — M4125 Other idiopathic scoliosis, thoracolumbar region: Secondary | ICD-10-CM | POA: Diagnosis not present

## 2014-07-23 DIAGNOSIS — M47816 Spondylosis without myelopathy or radiculopathy, lumbar region: Secondary | ICD-10-CM | POA: Diagnosis not present

## 2014-07-23 DIAGNOSIS — M542 Cervicalgia: Secondary | ICD-10-CM | POA: Diagnosis not present

## 2014-07-23 DIAGNOSIS — M545 Low back pain: Secondary | ICD-10-CM | POA: Diagnosis not present

## 2014-07-23 DIAGNOSIS — M546 Pain in thoracic spine: Secondary | ICD-10-CM | POA: Diagnosis not present

## 2014-07-23 DIAGNOSIS — M5136 Other intervertebral disc degeneration, lumbar region: Secondary | ICD-10-CM | POA: Diagnosis not present

## 2014-07-23 DIAGNOSIS — S32020A Wedge compression fracture of second lumbar vertebra, initial encounter for closed fracture: Secondary | ICD-10-CM | POA: Diagnosis not present

## 2014-07-26 DIAGNOSIS — R262 Difficulty in walking, not elsewhere classified: Secondary | ICD-10-CM | POA: Diagnosis not present

## 2014-07-26 DIAGNOSIS — G903 Multi-system degeneration of the autonomic nervous system: Secondary | ICD-10-CM | POA: Diagnosis not present

## 2014-07-26 DIAGNOSIS — S22089A Unspecified fracture of T11-T12 vertebra, initial encounter for closed fracture: Secondary | ICD-10-CM | POA: Diagnosis present

## 2014-07-26 DIAGNOSIS — R55 Syncope and collapse: Secondary | ICD-10-CM | POA: Diagnosis not present

## 2014-07-26 DIAGNOSIS — K828 Other specified diseases of gallbladder: Secondary | ICD-10-CM | POA: Diagnosis present

## 2014-07-26 DIAGNOSIS — I482 Chronic atrial fibrillation: Secondary | ICD-10-CM | POA: Diagnosis present

## 2014-07-26 DIAGNOSIS — Z4889 Encounter for other specified surgical aftercare: Secondary | ICD-10-CM | POA: Diagnosis not present

## 2014-07-26 DIAGNOSIS — R42 Dizziness and giddiness: Secondary | ICD-10-CM | POA: Diagnosis not present

## 2014-07-26 DIAGNOSIS — K8020 Calculus of gallbladder without cholecystitis without obstruction: Secondary | ICD-10-CM | POA: Diagnosis present

## 2014-07-26 DIAGNOSIS — R110 Nausea: Secondary | ICD-10-CM | POA: Diagnosis not present

## 2014-07-26 DIAGNOSIS — S22080D Wedge compression fracture of T11-T12 vertebra, subsequent encounter for fracture with routine healing: Secondary | ICD-10-CM | POA: Diagnosis not present

## 2014-07-26 DIAGNOSIS — E039 Hypothyroidism, unspecified: Secondary | ICD-10-CM | POA: Diagnosis present

## 2014-07-26 DIAGNOSIS — G459 Transient cerebral ischemic attack, unspecified: Secondary | ICD-10-CM | POA: Diagnosis not present

## 2014-07-26 DIAGNOSIS — M4806 Spinal stenosis, lumbar region: Secondary | ICD-10-CM | POA: Diagnosis not present

## 2014-07-26 DIAGNOSIS — R41841 Cognitive communication deficit: Secondary | ICD-10-CM | POA: Diagnosis not present

## 2014-07-26 DIAGNOSIS — K5900 Constipation, unspecified: Secondary | ICD-10-CM | POA: Diagnosis not present

## 2014-07-26 DIAGNOSIS — Z96659 Presence of unspecified artificial knee joint: Secondary | ICD-10-CM | POA: Diagnosis present

## 2014-07-26 DIAGNOSIS — M47816 Spondylosis without myelopathy or radiculopathy, lumbar region: Secondary | ICD-10-CM | POA: Diagnosis not present

## 2014-07-26 DIAGNOSIS — R531 Weakness: Secondary | ICD-10-CM | POA: Diagnosis not present

## 2014-07-26 DIAGNOSIS — I351 Nonrheumatic aortic (valve) insufficiency: Secondary | ICD-10-CM | POA: Diagnosis not present

## 2014-07-26 DIAGNOSIS — K5730 Diverticulosis of large intestine without perforation or abscess without bleeding: Secondary | ICD-10-CM | POA: Diagnosis not present

## 2014-07-26 DIAGNOSIS — R930 Abnormal findings on diagnostic imaging of skull and head, not elsewhere classified: Secondary | ICD-10-CM | POA: Diagnosis not present

## 2014-07-26 DIAGNOSIS — M47814 Spondylosis without myelopathy or radiculopathy, thoracic region: Secondary | ICD-10-CM | POA: Diagnosis present

## 2014-07-26 DIAGNOSIS — Z7901 Long term (current) use of anticoagulants: Secondary | ICD-10-CM | POA: Diagnosis not present

## 2014-07-26 DIAGNOSIS — I4892 Unspecified atrial flutter: Secondary | ICD-10-CM | POA: Diagnosis not present

## 2014-07-26 DIAGNOSIS — Z8739 Personal history of other diseases of the musculoskeletal system and connective tissue: Secondary | ICD-10-CM | POA: Diagnosis not present

## 2014-07-26 DIAGNOSIS — I4891 Unspecified atrial fibrillation: Secondary | ICD-10-CM | POA: Diagnosis not present

## 2014-07-26 DIAGNOSIS — M5136 Other intervertebral disc degeneration, lumbar region: Secondary | ICD-10-CM | POA: Diagnosis not present

## 2014-07-26 DIAGNOSIS — S22089D Unspecified fracture of T11-T12 vertebra, subsequent encounter for fracture with routine healing: Secondary | ICD-10-CM | POA: Diagnosis not present

## 2014-07-26 DIAGNOSIS — R4182 Altered mental status, unspecified: Secondary | ICD-10-CM | POA: Diagnosis not present

## 2014-07-26 DIAGNOSIS — M6281 Muscle weakness (generalized): Secondary | ICD-10-CM | POA: Diagnosis not present

## 2014-07-26 DIAGNOSIS — G20 Parkinson's disease: Secondary | ICD-10-CM | POA: Diagnosis not present

## 2014-07-26 DIAGNOSIS — G319 Degenerative disease of nervous system, unspecified: Secondary | ICD-10-CM | POA: Diagnosis not present

## 2014-07-26 DIAGNOSIS — M4854XA Collapsed vertebra, not elsewhere classified, thoracic region, initial encounter for fracture: Secondary | ICD-10-CM | POA: Diagnosis not present

## 2014-07-26 DIAGNOSIS — C61 Malignant neoplasm of prostate: Secondary | ICD-10-CM | POA: Diagnosis not present

## 2014-08-03 DIAGNOSIS — R262 Difficulty in walking, not elsewhere classified: Secondary | ICD-10-CM | POA: Diagnosis not present

## 2014-08-03 DIAGNOSIS — M6281 Muscle weakness (generalized): Secondary | ICD-10-CM | POA: Diagnosis not present

## 2014-08-03 DIAGNOSIS — G20 Parkinson's disease: Secondary | ICD-10-CM | POA: Diagnosis not present

## 2014-08-03 DIAGNOSIS — Z7901 Long term (current) use of anticoagulants: Secondary | ICD-10-CM | POA: Diagnosis not present

## 2014-08-03 DIAGNOSIS — I10 Essential (primary) hypertension: Secondary | ICD-10-CM | POA: Diagnosis not present

## 2014-08-03 DIAGNOSIS — I4891 Unspecified atrial fibrillation: Secondary | ICD-10-CM | POA: Diagnosis not present

## 2014-08-03 DIAGNOSIS — K5900 Constipation, unspecified: Secondary | ICD-10-CM | POA: Diagnosis not present

## 2014-08-03 DIAGNOSIS — R41841 Cognitive communication deficit: Secondary | ICD-10-CM | POA: Diagnosis not present

## 2014-08-03 DIAGNOSIS — Z4889 Encounter for other specified surgical aftercare: Secondary | ICD-10-CM | POA: Diagnosis not present

## 2014-08-03 DIAGNOSIS — S22080D Wedge compression fracture of T11-T12 vertebra, subsequent encounter for fracture with routine healing: Secondary | ICD-10-CM | POA: Diagnosis not present

## 2014-08-16 DIAGNOSIS — K5900 Constipation, unspecified: Secondary | ICD-10-CM | POA: Diagnosis not present

## 2014-08-31 DIAGNOSIS — G20 Parkinson's disease: Secondary | ICD-10-CM | POA: Diagnosis not present

## 2014-09-06 DIAGNOSIS — M6281 Muscle weakness (generalized): Secondary | ICD-10-CM | POA: Diagnosis not present

## 2014-09-06 DIAGNOSIS — I4891 Unspecified atrial fibrillation: Secondary | ICD-10-CM | POA: Diagnosis not present

## 2014-09-10 DIAGNOSIS — I10 Essential (primary) hypertension: Secondary | ICD-10-CM | POA: Diagnosis not present

## 2014-09-10 DIAGNOSIS — G20 Parkinson's disease: Secondary | ICD-10-CM | POA: Diagnosis not present

## 2014-09-14 DIAGNOSIS — G20 Parkinson's disease: Secondary | ICD-10-CM | POA: Diagnosis not present

## 2014-09-14 DIAGNOSIS — I10 Essential (primary) hypertension: Secondary | ICD-10-CM | POA: Diagnosis not present

## 2014-09-15 DIAGNOSIS — Z9181 History of falling: Secondary | ICD-10-CM | POA: Diagnosis not present

## 2014-09-15 DIAGNOSIS — I4891 Unspecified atrial fibrillation: Secondary | ICD-10-CM | POA: Diagnosis not present

## 2014-09-15 DIAGNOSIS — E2740 Unspecified adrenocortical insufficiency: Secondary | ICD-10-CM | POA: Diagnosis not present

## 2014-09-15 DIAGNOSIS — I10 Essential (primary) hypertension: Secondary | ICD-10-CM | POA: Diagnosis not present

## 2014-09-15 DIAGNOSIS — M6281 Muscle weakness (generalized): Secondary | ICD-10-CM | POA: Diagnosis not present

## 2014-09-15 DIAGNOSIS — Z8546 Personal history of malignant neoplasm of prostate: Secondary | ICD-10-CM | POA: Diagnosis not present

## 2014-09-15 DIAGNOSIS — S22080D Wedge compression fracture of T11-T12 vertebra, subsequent encounter for fracture with routine healing: Secondary | ICD-10-CM | POA: Diagnosis not present

## 2014-09-15 DIAGNOSIS — G20 Parkinson's disease: Secondary | ICD-10-CM | POA: Diagnosis not present

## 2014-09-15 DIAGNOSIS — F419 Anxiety disorder, unspecified: Secondary | ICD-10-CM | POA: Diagnosis not present

## 2014-09-15 DIAGNOSIS — F329 Major depressive disorder, single episode, unspecified: Secondary | ICD-10-CM | POA: Diagnosis not present

## 2014-09-16 DIAGNOSIS — I10 Essential (primary) hypertension: Secondary | ICD-10-CM | POA: Diagnosis not present

## 2014-09-16 DIAGNOSIS — F329 Major depressive disorder, single episode, unspecified: Secondary | ICD-10-CM | POA: Diagnosis not present

## 2014-09-16 DIAGNOSIS — S22080D Wedge compression fracture of T11-T12 vertebra, subsequent encounter for fracture with routine healing: Secondary | ICD-10-CM | POA: Diagnosis not present

## 2014-09-16 DIAGNOSIS — E2740 Unspecified adrenocortical insufficiency: Secondary | ICD-10-CM | POA: Diagnosis not present

## 2014-09-16 DIAGNOSIS — G20 Parkinson's disease: Secondary | ICD-10-CM | POA: Diagnosis not present

## 2014-09-16 DIAGNOSIS — I4891 Unspecified atrial fibrillation: Secondary | ICD-10-CM | POA: Diagnosis not present

## 2014-09-17 DIAGNOSIS — A070 Balantidiasis: Secondary | ICD-10-CM | POA: Diagnosis not present

## 2014-09-18 DIAGNOSIS — I4891 Unspecified atrial fibrillation: Secondary | ICD-10-CM | POA: Diagnosis not present

## 2014-09-18 DIAGNOSIS — F329 Major depressive disorder, single episode, unspecified: Secondary | ICD-10-CM | POA: Diagnosis not present

## 2014-09-18 DIAGNOSIS — E2740 Unspecified adrenocortical insufficiency: Secondary | ICD-10-CM | POA: Diagnosis not present

## 2014-09-18 DIAGNOSIS — I10 Essential (primary) hypertension: Secondary | ICD-10-CM | POA: Diagnosis not present

## 2014-09-18 DIAGNOSIS — G20 Parkinson's disease: Secondary | ICD-10-CM | POA: Diagnosis not present

## 2014-09-18 DIAGNOSIS — S22080D Wedge compression fracture of T11-T12 vertebra, subsequent encounter for fracture with routine healing: Secondary | ICD-10-CM | POA: Diagnosis not present

## 2014-09-20 DIAGNOSIS — F329 Major depressive disorder, single episode, unspecified: Secondary | ICD-10-CM | POA: Diagnosis not present

## 2014-09-20 DIAGNOSIS — I4891 Unspecified atrial fibrillation: Secondary | ICD-10-CM | POA: Diagnosis not present

## 2014-09-20 DIAGNOSIS — I10 Essential (primary) hypertension: Secondary | ICD-10-CM | POA: Diagnosis not present

## 2014-09-20 DIAGNOSIS — G20 Parkinson's disease: Secondary | ICD-10-CM | POA: Diagnosis not present

## 2014-09-20 DIAGNOSIS — S22080D Wedge compression fracture of T11-T12 vertebra, subsequent encounter for fracture with routine healing: Secondary | ICD-10-CM | POA: Diagnosis not present

## 2014-09-20 DIAGNOSIS — E2740 Unspecified adrenocortical insufficiency: Secondary | ICD-10-CM | POA: Diagnosis not present

## 2014-09-25 DIAGNOSIS — I4891 Unspecified atrial fibrillation: Secondary | ICD-10-CM | POA: Diagnosis not present

## 2014-09-25 DIAGNOSIS — F329 Major depressive disorder, single episode, unspecified: Secondary | ICD-10-CM | POA: Diagnosis not present

## 2014-09-25 DIAGNOSIS — S22080D Wedge compression fracture of T11-T12 vertebra, subsequent encounter for fracture with routine healing: Secondary | ICD-10-CM | POA: Diagnosis not present

## 2014-09-25 DIAGNOSIS — G20 Parkinson's disease: Secondary | ICD-10-CM | POA: Diagnosis not present

## 2014-09-25 DIAGNOSIS — E2740 Unspecified adrenocortical insufficiency: Secondary | ICD-10-CM | POA: Diagnosis not present

## 2014-09-25 DIAGNOSIS — I10 Essential (primary) hypertension: Secondary | ICD-10-CM | POA: Diagnosis not present

## 2014-09-27 DIAGNOSIS — E2740 Unspecified adrenocortical insufficiency: Secondary | ICD-10-CM | POA: Diagnosis not present

## 2014-09-27 DIAGNOSIS — F329 Major depressive disorder, single episode, unspecified: Secondary | ICD-10-CM | POA: Diagnosis not present

## 2014-09-27 DIAGNOSIS — S22080D Wedge compression fracture of T11-T12 vertebra, subsequent encounter for fracture with routine healing: Secondary | ICD-10-CM | POA: Diagnosis not present

## 2014-09-27 DIAGNOSIS — I4891 Unspecified atrial fibrillation: Secondary | ICD-10-CM | POA: Diagnosis not present

## 2014-09-27 DIAGNOSIS — G20 Parkinson's disease: Secondary | ICD-10-CM | POA: Diagnosis not present

## 2014-09-27 DIAGNOSIS — I10 Essential (primary) hypertension: Secondary | ICD-10-CM | POA: Diagnosis not present

## 2014-10-01 DIAGNOSIS — M47816 Spondylosis without myelopathy or radiculopathy, lumbar region: Secondary | ICD-10-CM | POA: Diagnosis not present

## 2014-10-01 DIAGNOSIS — M4125 Other idiopathic scoliosis, thoracolumbar region: Secondary | ICD-10-CM | POA: Diagnosis not present

## 2014-10-01 DIAGNOSIS — M546 Pain in thoracic spine: Secondary | ICD-10-CM | POA: Diagnosis not present

## 2014-10-01 DIAGNOSIS — M5136 Other intervertebral disc degeneration, lumbar region: Secondary | ICD-10-CM | POA: Diagnosis not present

## 2014-10-01 DIAGNOSIS — S32020A Wedge compression fracture of second lumbar vertebra, initial encounter for closed fracture: Secondary | ICD-10-CM | POA: Diagnosis not present

## 2014-10-01 DIAGNOSIS — M545 Low back pain: Secondary | ICD-10-CM | POA: Diagnosis not present

## 2014-10-01 DIAGNOSIS — M542 Cervicalgia: Secondary | ICD-10-CM | POA: Diagnosis not present

## 2014-10-02 DIAGNOSIS — I10 Essential (primary) hypertension: Secondary | ICD-10-CM | POA: Diagnosis not present

## 2014-10-02 DIAGNOSIS — S22080D Wedge compression fracture of T11-T12 vertebra, subsequent encounter for fracture with routine healing: Secondary | ICD-10-CM | POA: Diagnosis not present

## 2014-10-02 DIAGNOSIS — G20 Parkinson's disease: Secondary | ICD-10-CM | POA: Diagnosis not present

## 2014-10-02 DIAGNOSIS — E2740 Unspecified adrenocortical insufficiency: Secondary | ICD-10-CM | POA: Diagnosis not present

## 2014-10-02 DIAGNOSIS — I4891 Unspecified atrial fibrillation: Secondary | ICD-10-CM | POA: Diagnosis not present

## 2014-10-02 DIAGNOSIS — F329 Major depressive disorder, single episode, unspecified: Secondary | ICD-10-CM | POA: Diagnosis not present

## 2014-10-04 DIAGNOSIS — M4125 Other idiopathic scoliosis, thoracolumbar region: Secondary | ICD-10-CM | POA: Diagnosis not present

## 2014-10-04 DIAGNOSIS — M546 Pain in thoracic spine: Secondary | ICD-10-CM | POA: Diagnosis not present

## 2014-10-04 DIAGNOSIS — M542 Cervicalgia: Secondary | ICD-10-CM | POA: Diagnosis not present

## 2014-10-04 DIAGNOSIS — M545 Low back pain: Secondary | ICD-10-CM | POA: Diagnosis not present

## 2014-10-04 DIAGNOSIS — S32020A Wedge compression fracture of second lumbar vertebra, initial encounter for closed fracture: Secondary | ICD-10-CM | POA: Diagnosis not present

## 2014-10-04 DIAGNOSIS — M5136 Other intervertebral disc degeneration, lumbar region: Secondary | ICD-10-CM | POA: Diagnosis not present

## 2014-10-04 DIAGNOSIS — M47816 Spondylosis without myelopathy or radiculopathy, lumbar region: Secondary | ICD-10-CM | POA: Diagnosis not present

## 2014-10-05 DIAGNOSIS — S22080D Wedge compression fracture of T11-T12 vertebra, subsequent encounter for fracture with routine healing: Secondary | ICD-10-CM | POA: Diagnosis not present

## 2014-10-05 DIAGNOSIS — I480 Paroxysmal atrial fibrillation: Secondary | ICD-10-CM | POA: Diagnosis not present

## 2014-10-05 DIAGNOSIS — F329 Major depressive disorder, single episode, unspecified: Secondary | ICD-10-CM | POA: Diagnosis not present

## 2014-10-05 DIAGNOSIS — G20 Parkinson's disease: Secondary | ICD-10-CM | POA: Diagnosis not present

## 2014-10-05 DIAGNOSIS — I4891 Unspecified atrial fibrillation: Secondary | ICD-10-CM | POA: Diagnosis not present

## 2014-10-05 DIAGNOSIS — S32000A Wedge compression fracture of unspecified lumbar vertebra, initial encounter for closed fracture: Secondary | ICD-10-CM | POA: Diagnosis not present

## 2014-10-05 DIAGNOSIS — I10 Essential (primary) hypertension: Secondary | ICD-10-CM | POA: Diagnosis not present

## 2014-10-05 DIAGNOSIS — E2740 Unspecified adrenocortical insufficiency: Secondary | ICD-10-CM | POA: Diagnosis not present

## 2014-10-09 DIAGNOSIS — S32020A Wedge compression fracture of second lumbar vertebra, initial encounter for closed fracture: Secondary | ICD-10-CM | POA: Diagnosis not present

## 2014-10-09 DIAGNOSIS — M542 Cervicalgia: Secondary | ICD-10-CM | POA: Diagnosis not present

## 2014-10-09 DIAGNOSIS — M5136 Other intervertebral disc degeneration, lumbar region: Secondary | ICD-10-CM | POA: Diagnosis not present

## 2014-10-09 DIAGNOSIS — M4125 Other idiopathic scoliosis, thoracolumbar region: Secondary | ICD-10-CM | POA: Diagnosis not present

## 2014-10-09 DIAGNOSIS — M47816 Spondylosis without myelopathy or radiculopathy, lumbar region: Secondary | ICD-10-CM | POA: Diagnosis not present

## 2014-10-09 DIAGNOSIS — M546 Pain in thoracic spine: Secondary | ICD-10-CM | POA: Diagnosis not present

## 2014-10-09 DIAGNOSIS — M545 Low back pain: Secondary | ICD-10-CM | POA: Diagnosis not present

## 2014-10-15 DIAGNOSIS — M9901 Segmental and somatic dysfunction of cervical region: Secondary | ICD-10-CM | POA: Diagnosis not present

## 2014-10-15 DIAGNOSIS — M47812 Spondylosis without myelopathy or radiculopathy, cervical region: Secondary | ICD-10-CM | POA: Diagnosis not present

## 2014-10-15 DIAGNOSIS — M9903 Segmental and somatic dysfunction of lumbar region: Secondary | ICD-10-CM | POA: Diagnosis not present

## 2014-10-15 DIAGNOSIS — M47896 Other spondylosis, lumbar region: Secondary | ICD-10-CM | POA: Diagnosis not present

## 2014-10-16 DIAGNOSIS — M9903 Segmental and somatic dysfunction of lumbar region: Secondary | ICD-10-CM | POA: Diagnosis not present

## 2014-10-16 DIAGNOSIS — M9901 Segmental and somatic dysfunction of cervical region: Secondary | ICD-10-CM | POA: Diagnosis not present

## 2014-10-16 DIAGNOSIS — M47896 Other spondylosis, lumbar region: Secondary | ICD-10-CM | POA: Diagnosis not present

## 2014-10-16 DIAGNOSIS — M47812 Spondylosis without myelopathy or radiculopathy, cervical region: Secondary | ICD-10-CM | POA: Diagnosis not present

## 2014-10-17 DIAGNOSIS — M47896 Other spondylosis, lumbar region: Secondary | ICD-10-CM | POA: Diagnosis not present

## 2014-10-17 DIAGNOSIS — M9903 Segmental and somatic dysfunction of lumbar region: Secondary | ICD-10-CM | POA: Diagnosis not present

## 2014-10-17 DIAGNOSIS — M9901 Segmental and somatic dysfunction of cervical region: Secondary | ICD-10-CM | POA: Diagnosis not present

## 2014-10-17 DIAGNOSIS — M47812 Spondylosis without myelopathy or radiculopathy, cervical region: Secondary | ICD-10-CM | POA: Diagnosis not present

## 2014-10-18 DIAGNOSIS — M9901 Segmental and somatic dysfunction of cervical region: Secondary | ICD-10-CM | POA: Diagnosis not present

## 2014-10-18 DIAGNOSIS — M9903 Segmental and somatic dysfunction of lumbar region: Secondary | ICD-10-CM | POA: Diagnosis not present

## 2014-10-18 DIAGNOSIS — M47812 Spondylosis without myelopathy or radiculopathy, cervical region: Secondary | ICD-10-CM | POA: Diagnosis not present

## 2014-10-18 DIAGNOSIS — M47896 Other spondylosis, lumbar region: Secondary | ICD-10-CM | POA: Diagnosis not present

## 2014-10-22 DIAGNOSIS — M9901 Segmental and somatic dysfunction of cervical region: Secondary | ICD-10-CM | POA: Diagnosis not present

## 2014-10-22 DIAGNOSIS — M47812 Spondylosis without myelopathy or radiculopathy, cervical region: Secondary | ICD-10-CM | POA: Diagnosis not present

## 2014-10-22 DIAGNOSIS — M9903 Segmental and somatic dysfunction of lumbar region: Secondary | ICD-10-CM | POA: Diagnosis not present

## 2014-10-22 DIAGNOSIS — M47896 Other spondylosis, lumbar region: Secondary | ICD-10-CM | POA: Diagnosis not present

## 2014-10-23 DIAGNOSIS — M9903 Segmental and somatic dysfunction of lumbar region: Secondary | ICD-10-CM | POA: Diagnosis not present

## 2014-10-23 DIAGNOSIS — M47896 Other spondylosis, lumbar region: Secondary | ICD-10-CM | POA: Diagnosis not present

## 2014-10-23 DIAGNOSIS — M9901 Segmental and somatic dysfunction of cervical region: Secondary | ICD-10-CM | POA: Diagnosis not present

## 2014-10-23 DIAGNOSIS — M47812 Spondylosis without myelopathy or radiculopathy, cervical region: Secondary | ICD-10-CM | POA: Diagnosis not present

## 2014-10-24 DIAGNOSIS — M9903 Segmental and somatic dysfunction of lumbar region: Secondary | ICD-10-CM | POA: Diagnosis not present

## 2014-10-24 DIAGNOSIS — M47896 Other spondylosis, lumbar region: Secondary | ICD-10-CM | POA: Diagnosis not present

## 2014-10-24 DIAGNOSIS — M9901 Segmental and somatic dysfunction of cervical region: Secondary | ICD-10-CM | POA: Diagnosis not present

## 2014-10-24 DIAGNOSIS — M47812 Spondylosis without myelopathy or radiculopathy, cervical region: Secondary | ICD-10-CM | POA: Diagnosis not present

## 2014-10-29 DIAGNOSIS — M47812 Spondylosis without myelopathy or radiculopathy, cervical region: Secondary | ICD-10-CM | POA: Diagnosis not present

## 2014-10-29 DIAGNOSIS — M9901 Segmental and somatic dysfunction of cervical region: Secondary | ICD-10-CM | POA: Diagnosis not present

## 2014-10-29 DIAGNOSIS — M47896 Other spondylosis, lumbar region: Secondary | ICD-10-CM | POA: Diagnosis not present

## 2014-10-29 DIAGNOSIS — M9903 Segmental and somatic dysfunction of lumbar region: Secondary | ICD-10-CM | POA: Diagnosis not present

## 2014-10-30 DIAGNOSIS — M9903 Segmental and somatic dysfunction of lumbar region: Secondary | ICD-10-CM | POA: Diagnosis not present

## 2014-10-30 DIAGNOSIS — M47896 Other spondylosis, lumbar region: Secondary | ICD-10-CM | POA: Diagnosis not present

## 2014-10-30 DIAGNOSIS — M47812 Spondylosis without myelopathy or radiculopathy, cervical region: Secondary | ICD-10-CM | POA: Diagnosis not present

## 2014-10-30 DIAGNOSIS — M9901 Segmental and somatic dysfunction of cervical region: Secondary | ICD-10-CM | POA: Diagnosis not present

## 2014-11-05 DIAGNOSIS — M9901 Segmental and somatic dysfunction of cervical region: Secondary | ICD-10-CM | POA: Diagnosis not present

## 2014-11-05 DIAGNOSIS — M47896 Other spondylosis, lumbar region: Secondary | ICD-10-CM | POA: Diagnosis not present

## 2014-11-05 DIAGNOSIS — M9903 Segmental and somatic dysfunction of lumbar region: Secondary | ICD-10-CM | POA: Diagnosis not present

## 2014-11-05 DIAGNOSIS — M47812 Spondylosis without myelopathy or radiculopathy, cervical region: Secondary | ICD-10-CM | POA: Diagnosis not present

## 2014-11-07 DIAGNOSIS — M9903 Segmental and somatic dysfunction of lumbar region: Secondary | ICD-10-CM | POA: Diagnosis not present

## 2014-11-07 DIAGNOSIS — M9901 Segmental and somatic dysfunction of cervical region: Secondary | ICD-10-CM | POA: Diagnosis not present

## 2014-11-07 DIAGNOSIS — M47812 Spondylosis without myelopathy or radiculopathy, cervical region: Secondary | ICD-10-CM | POA: Diagnosis not present

## 2014-11-07 DIAGNOSIS — M47896 Other spondylosis, lumbar region: Secondary | ICD-10-CM | POA: Diagnosis not present

## 2014-11-12 DIAGNOSIS — M9903 Segmental and somatic dysfunction of lumbar region: Secondary | ICD-10-CM | POA: Diagnosis not present

## 2014-11-12 DIAGNOSIS — M47812 Spondylosis without myelopathy or radiculopathy, cervical region: Secondary | ICD-10-CM | POA: Diagnosis not present

## 2014-11-12 DIAGNOSIS — M47896 Other spondylosis, lumbar region: Secondary | ICD-10-CM | POA: Diagnosis not present

## 2014-11-12 DIAGNOSIS — M9901 Segmental and somatic dysfunction of cervical region: Secondary | ICD-10-CM | POA: Diagnosis not present

## 2014-11-14 DIAGNOSIS — M9901 Segmental and somatic dysfunction of cervical region: Secondary | ICD-10-CM | POA: Diagnosis not present

## 2014-11-14 DIAGNOSIS — M47812 Spondylosis without myelopathy or radiculopathy, cervical region: Secondary | ICD-10-CM | POA: Diagnosis not present

## 2014-11-14 DIAGNOSIS — M9903 Segmental and somatic dysfunction of lumbar region: Secondary | ICD-10-CM | POA: Diagnosis not present

## 2014-11-14 DIAGNOSIS — M47896 Other spondylosis, lumbar region: Secondary | ICD-10-CM | POA: Diagnosis not present

## 2014-11-15 DIAGNOSIS — S22080D Wedge compression fracture of T11-T12 vertebra, subsequent encounter for fracture with routine healing: Secondary | ICD-10-CM | POA: Diagnosis not present

## 2014-12-10 DIAGNOSIS — M9904 Segmental and somatic dysfunction of sacral region: Secondary | ICD-10-CM | POA: Diagnosis not present

## 2014-12-10 DIAGNOSIS — G541 Lumbosacral plexus disorders: Secondary | ICD-10-CM | POA: Diagnosis not present

## 2014-12-10 DIAGNOSIS — M545 Low back pain: Secondary | ICD-10-CM | POA: Diagnosis not present

## 2014-12-10 DIAGNOSIS — M5126 Other intervertebral disc displacement, lumbar region: Secondary | ICD-10-CM | POA: Diagnosis not present

## 2014-12-10 DIAGNOSIS — M5416 Radiculopathy, lumbar region: Secondary | ICD-10-CM | POA: Diagnosis not present

## 2014-12-17 DIAGNOSIS — M545 Low back pain: Secondary | ICD-10-CM | POA: Diagnosis not present

## 2014-12-17 DIAGNOSIS — M5126 Other intervertebral disc displacement, lumbar region: Secondary | ICD-10-CM | POA: Diagnosis not present

## 2014-12-17 DIAGNOSIS — M5416 Radiculopathy, lumbar region: Secondary | ICD-10-CM | POA: Diagnosis not present

## 2014-12-17 DIAGNOSIS — M9904 Segmental and somatic dysfunction of sacral region: Secondary | ICD-10-CM | POA: Diagnosis not present

## 2014-12-24 DIAGNOSIS — M545 Low back pain: Secondary | ICD-10-CM | POA: Diagnosis not present

## 2014-12-24 DIAGNOSIS — M5416 Radiculopathy, lumbar region: Secondary | ICD-10-CM | POA: Diagnosis not present

## 2014-12-24 DIAGNOSIS — M5126 Other intervertebral disc displacement, lumbar region: Secondary | ICD-10-CM | POA: Diagnosis not present

## 2014-12-24 DIAGNOSIS — M9904 Segmental and somatic dysfunction of sacral region: Secondary | ICD-10-CM | POA: Diagnosis not present

## 2014-12-24 DIAGNOSIS — M5134 Other intervertebral disc degeneration, thoracic region: Secondary | ICD-10-CM | POA: Diagnosis not present

## 2014-12-27 DIAGNOSIS — D3131 Benign neoplasm of right choroid: Secondary | ICD-10-CM | POA: Diagnosis not present

## 2014-12-27 DIAGNOSIS — H2513 Age-related nuclear cataract, bilateral: Secondary | ICD-10-CM | POA: Diagnosis not present

## 2014-12-27 DIAGNOSIS — H5703 Miosis: Secondary | ICD-10-CM | POA: Diagnosis not present

## 2015-01-01 DIAGNOSIS — S336XXA Sprain of sacroiliac joint, initial encounter: Secondary | ICD-10-CM | POA: Diagnosis not present

## 2015-01-01 DIAGNOSIS — M545 Low back pain: Secondary | ICD-10-CM | POA: Diagnosis not present

## 2015-01-01 DIAGNOSIS — M9904 Segmental and somatic dysfunction of sacral region: Secondary | ICD-10-CM | POA: Diagnosis not present

## 2015-01-01 DIAGNOSIS — M5416 Radiculopathy, lumbar region: Secondary | ICD-10-CM | POA: Diagnosis not present

## 2015-01-07 DIAGNOSIS — M5127 Other intervertebral disc displacement, lumbosacral region: Secondary | ICD-10-CM | POA: Diagnosis not present

## 2015-01-07 DIAGNOSIS — M545 Low back pain: Secondary | ICD-10-CM | POA: Diagnosis not present

## 2015-01-07 DIAGNOSIS — M5416 Radiculopathy, lumbar region: Secondary | ICD-10-CM | POA: Diagnosis not present

## 2015-01-07 DIAGNOSIS — M9904 Segmental and somatic dysfunction of sacral region: Secondary | ICD-10-CM | POA: Diagnosis not present

## 2015-01-14 DIAGNOSIS — M545 Low back pain: Secondary | ICD-10-CM | POA: Diagnosis not present

## 2015-01-14 DIAGNOSIS — M4806 Spinal stenosis, lumbar region: Secondary | ICD-10-CM | POA: Diagnosis not present

## 2015-01-14 DIAGNOSIS — M5126 Other intervertebral disc displacement, lumbar region: Secondary | ICD-10-CM | POA: Diagnosis not present

## 2015-01-14 DIAGNOSIS — M9904 Segmental and somatic dysfunction of sacral region: Secondary | ICD-10-CM | POA: Diagnosis not present

## 2015-01-14 DIAGNOSIS — M5416 Radiculopathy, lumbar region: Secondary | ICD-10-CM | POA: Diagnosis not present

## 2015-01-21 DIAGNOSIS — M5416 Radiculopathy, lumbar region: Secondary | ICD-10-CM | POA: Diagnosis not present

## 2015-01-21 DIAGNOSIS — M4806 Spinal stenosis, lumbar region: Secondary | ICD-10-CM | POA: Diagnosis not present

## 2015-01-21 DIAGNOSIS — M5126 Other intervertebral disc displacement, lumbar region: Secondary | ICD-10-CM | POA: Diagnosis not present

## 2015-01-21 DIAGNOSIS — M545 Low back pain: Secondary | ICD-10-CM | POA: Diagnosis not present

## 2015-01-21 DIAGNOSIS — M9904 Segmental and somatic dysfunction of sacral region: Secondary | ICD-10-CM | POA: Diagnosis not present

## 2015-01-28 DIAGNOSIS — M545 Low back pain: Secondary | ICD-10-CM | POA: Diagnosis not present

## 2015-01-28 DIAGNOSIS — M9904 Segmental and somatic dysfunction of sacral region: Secondary | ICD-10-CM | POA: Diagnosis not present

## 2015-01-28 DIAGNOSIS — M5416 Radiculopathy, lumbar region: Secondary | ICD-10-CM | POA: Diagnosis not present

## 2015-01-28 DIAGNOSIS — M4806 Spinal stenosis, lumbar region: Secondary | ICD-10-CM | POA: Diagnosis not present

## 2015-01-28 DIAGNOSIS — M5126 Other intervertebral disc displacement, lumbar region: Secondary | ICD-10-CM | POA: Diagnosis not present

## 2015-02-04 DIAGNOSIS — M9903 Segmental and somatic dysfunction of lumbar region: Secondary | ICD-10-CM | POA: Diagnosis not present

## 2015-02-04 DIAGNOSIS — M9904 Segmental and somatic dysfunction of sacral region: Secondary | ICD-10-CM | POA: Diagnosis not present

## 2015-02-04 DIAGNOSIS — M797 Fibromyalgia: Secondary | ICD-10-CM | POA: Diagnosis not present

## 2015-02-08 DIAGNOSIS — I484 Atypical atrial flutter: Secondary | ICD-10-CM | POA: Diagnosis not present

## 2015-02-08 DIAGNOSIS — R002 Palpitations: Secondary | ICD-10-CM | POA: Diagnosis not present

## 2015-02-08 DIAGNOSIS — I4891 Unspecified atrial fibrillation: Secondary | ICD-10-CM | POA: Diagnosis not present

## 2015-02-11 DIAGNOSIS — M5126 Other intervertebral disc displacement, lumbar region: Secondary | ICD-10-CM | POA: Diagnosis not present

## 2015-02-11 DIAGNOSIS — M9904 Segmental and somatic dysfunction of sacral region: Secondary | ICD-10-CM | POA: Diagnosis not present

## 2015-02-11 DIAGNOSIS — M545 Low back pain: Secondary | ICD-10-CM | POA: Diagnosis not present

## 2015-02-11 DIAGNOSIS — M5416 Radiculopathy, lumbar region: Secondary | ICD-10-CM | POA: Diagnosis not present

## 2015-02-19 DIAGNOSIS — M9904 Segmental and somatic dysfunction of sacral region: Secondary | ICD-10-CM | POA: Diagnosis not present

## 2015-02-19 DIAGNOSIS — M545 Low back pain: Secondary | ICD-10-CM | POA: Diagnosis not present

## 2015-02-19 DIAGNOSIS — M5127 Other intervertebral disc displacement, lumbosacral region: Secondary | ICD-10-CM | POA: Diagnosis not present

## 2015-02-26 DIAGNOSIS — M5126 Other intervertebral disc displacement, lumbar region: Secondary | ICD-10-CM | POA: Diagnosis not present

## 2015-02-26 DIAGNOSIS — M9904 Segmental and somatic dysfunction of sacral region: Secondary | ICD-10-CM | POA: Diagnosis not present

## 2015-02-26 DIAGNOSIS — M545 Low back pain: Secondary | ICD-10-CM | POA: Diagnosis not present

## 2015-02-26 DIAGNOSIS — M5416 Radiculopathy, lumbar region: Secondary | ICD-10-CM | POA: Diagnosis not present

## 2015-03-11 DIAGNOSIS — H5703 Miosis: Secondary | ICD-10-CM | POA: Diagnosis not present

## 2015-03-11 DIAGNOSIS — H2511 Age-related nuclear cataract, right eye: Secondary | ICD-10-CM | POA: Diagnosis not present

## 2015-03-13 DIAGNOSIS — R7301 Impaired fasting glucose: Secondary | ICD-10-CM | POA: Diagnosis not present

## 2015-03-13 DIAGNOSIS — E785 Hyperlipidemia, unspecified: Secondary | ICD-10-CM | POA: Diagnosis not present

## 2015-03-13 DIAGNOSIS — N390 Urinary tract infection, site not specified: Secondary | ICD-10-CM | POA: Diagnosis not present

## 2015-03-13 DIAGNOSIS — R8290 Unspecified abnormal findings in urine: Secondary | ICD-10-CM | POA: Diagnosis not present

## 2015-03-13 DIAGNOSIS — E039 Hypothyroidism, unspecified: Secondary | ICD-10-CM | POA: Diagnosis not present

## 2015-03-13 DIAGNOSIS — Z125 Encounter for screening for malignant neoplasm of prostate: Secondary | ICD-10-CM | POA: Diagnosis not present

## 2015-03-19 DIAGNOSIS — H2512 Age-related nuclear cataract, left eye: Secondary | ICD-10-CM | POA: Diagnosis not present

## 2015-03-21 DIAGNOSIS — Z6824 Body mass index (BMI) 24.0-24.9, adult: Secondary | ICD-10-CM | POA: Diagnosis not present

## 2015-03-21 DIAGNOSIS — F418 Other specified anxiety disorders: Secondary | ICD-10-CM | POA: Diagnosis not present

## 2015-03-21 DIAGNOSIS — I480 Paroxysmal atrial fibrillation: Secondary | ICD-10-CM | POA: Diagnosis not present

## 2015-03-21 DIAGNOSIS — R7301 Impaired fasting glucose: Secondary | ICD-10-CM | POA: Diagnosis not present

## 2015-03-21 DIAGNOSIS — N400 Enlarged prostate without lower urinary tract symptoms: Secondary | ICD-10-CM | POA: Diagnosis not present

## 2015-03-21 DIAGNOSIS — G20 Parkinson's disease: Secondary | ICD-10-CM | POA: Diagnosis not present

## 2015-03-21 DIAGNOSIS — M1990 Unspecified osteoarthritis, unspecified site: Secondary | ICD-10-CM | POA: Diagnosis not present

## 2015-03-21 DIAGNOSIS — E039 Hypothyroidism, unspecified: Secondary | ICD-10-CM | POA: Diagnosis not present

## 2015-03-21 DIAGNOSIS — Z0000 Encounter for general adult medical examination without abnormal findings: Secondary | ICD-10-CM | POA: Diagnosis not present

## 2015-03-21 DIAGNOSIS — E785 Hyperlipidemia, unspecified: Secondary | ICD-10-CM | POA: Diagnosis not present

## 2015-03-21 DIAGNOSIS — Z1389 Encounter for screening for other disorder: Secondary | ICD-10-CM | POA: Diagnosis not present

## 2015-03-25 DIAGNOSIS — H2512 Age-related nuclear cataract, left eye: Secondary | ICD-10-CM | POA: Diagnosis not present

## 2015-03-25 DIAGNOSIS — H269 Unspecified cataract: Secondary | ICD-10-CM | POA: Diagnosis not present

## 2015-03-25 DIAGNOSIS — H2181 Floppy iris syndrome: Secondary | ICD-10-CM | POA: Diagnosis not present

## 2015-03-25 DIAGNOSIS — H5703 Miosis: Secondary | ICD-10-CM | POA: Diagnosis not present

## 2015-04-10 DIAGNOSIS — M5126 Other intervertebral disc displacement, lumbar region: Secondary | ICD-10-CM | POA: Diagnosis not present

## 2015-04-10 DIAGNOSIS — M545 Low back pain: Secondary | ICD-10-CM | POA: Diagnosis not present

## 2015-04-10 DIAGNOSIS — M9904 Segmental and somatic dysfunction of sacral region: Secondary | ICD-10-CM | POA: Diagnosis not present

## 2015-04-10 DIAGNOSIS — M5416 Radiculopathy, lumbar region: Secondary | ICD-10-CM | POA: Diagnosis not present

## 2015-04-10 DIAGNOSIS — M4806 Spinal stenosis, lumbar region: Secondary | ICD-10-CM | POA: Diagnosis not present

## 2015-04-18 DIAGNOSIS — M545 Low back pain: Secondary | ICD-10-CM | POA: Diagnosis not present

## 2015-04-18 DIAGNOSIS — M797 Fibromyalgia: Secondary | ICD-10-CM | POA: Diagnosis not present

## 2015-04-18 DIAGNOSIS — M9903 Segmental and somatic dysfunction of lumbar region: Secondary | ICD-10-CM | POA: Diagnosis not present

## 2015-04-18 DIAGNOSIS — M9901 Segmental and somatic dysfunction of cervical region: Secondary | ICD-10-CM | POA: Diagnosis not present

## 2015-04-25 NOTE — Telephone Encounter (Signed)
Error

## 2015-05-06 DIAGNOSIS — M542 Cervicalgia: Secondary | ICD-10-CM | POA: Diagnosis not present

## 2015-05-06 DIAGNOSIS — M9901 Segmental and somatic dysfunction of cervical region: Secondary | ICD-10-CM | POA: Diagnosis not present

## 2015-05-06 DIAGNOSIS — M797 Fibromyalgia: Secondary | ICD-10-CM | POA: Diagnosis not present

## 2015-05-06 DIAGNOSIS — M9903 Segmental and somatic dysfunction of lumbar region: Secondary | ICD-10-CM | POA: Diagnosis not present

## 2015-05-14 DIAGNOSIS — M797 Fibromyalgia: Secondary | ICD-10-CM | POA: Diagnosis not present

## 2015-05-14 DIAGNOSIS — M545 Low back pain: Secondary | ICD-10-CM | POA: Diagnosis not present

## 2015-05-14 DIAGNOSIS — M9901 Segmental and somatic dysfunction of cervical region: Secondary | ICD-10-CM | POA: Diagnosis not present

## 2015-05-14 DIAGNOSIS — M9903 Segmental and somatic dysfunction of lumbar region: Secondary | ICD-10-CM | POA: Diagnosis not present

## 2015-05-20 DIAGNOSIS — M9901 Segmental and somatic dysfunction of cervical region: Secondary | ICD-10-CM | POA: Diagnosis not present

## 2015-05-20 DIAGNOSIS — M9903 Segmental and somatic dysfunction of lumbar region: Secondary | ICD-10-CM | POA: Diagnosis not present

## 2015-05-20 DIAGNOSIS — M9904 Segmental and somatic dysfunction of sacral region: Secondary | ICD-10-CM | POA: Diagnosis not present

## 2015-05-20 DIAGNOSIS — M545 Low back pain: Secondary | ICD-10-CM | POA: Diagnosis not present

## 2015-05-27 DIAGNOSIS — C61 Malignant neoplasm of prostate: Secondary | ICD-10-CM | POA: Diagnosis not present

## 2015-06-10 DIAGNOSIS — C61 Malignant neoplasm of prostate: Secondary | ICD-10-CM | POA: Diagnosis not present

## 2015-06-26 DIAGNOSIS — M542 Cervicalgia: Secondary | ICD-10-CM | POA: Diagnosis not present

## 2015-06-26 DIAGNOSIS — M9901 Segmental and somatic dysfunction of cervical region: Secondary | ICD-10-CM | POA: Diagnosis not present

## 2015-06-26 DIAGNOSIS — M545 Low back pain: Secondary | ICD-10-CM | POA: Diagnosis not present

## 2015-06-26 DIAGNOSIS — M9903 Segmental and somatic dysfunction of lumbar region: Secondary | ICD-10-CM | POA: Diagnosis not present

## 2015-07-02 DIAGNOSIS — M545 Low back pain: Secondary | ICD-10-CM | POA: Diagnosis not present

## 2015-07-02 DIAGNOSIS — M9903 Segmental and somatic dysfunction of lumbar region: Secondary | ICD-10-CM | POA: Diagnosis not present

## 2015-07-02 DIAGNOSIS — M9904 Segmental and somatic dysfunction of sacral region: Secondary | ICD-10-CM | POA: Diagnosis not present

## 2015-07-02 DIAGNOSIS — M797 Fibromyalgia: Secondary | ICD-10-CM | POA: Diagnosis not present

## 2015-07-10 DIAGNOSIS — M542 Cervicalgia: Secondary | ICD-10-CM | POA: Diagnosis not present

## 2015-07-10 DIAGNOSIS — M9903 Segmental and somatic dysfunction of lumbar region: Secondary | ICD-10-CM | POA: Diagnosis not present

## 2015-07-10 DIAGNOSIS — M9901 Segmental and somatic dysfunction of cervical region: Secondary | ICD-10-CM | POA: Diagnosis not present

## 2015-07-10 DIAGNOSIS — M545 Low back pain: Secondary | ICD-10-CM | POA: Diagnosis not present

## 2015-07-17 DIAGNOSIS — M9904 Segmental and somatic dysfunction of sacral region: Secondary | ICD-10-CM | POA: Diagnosis not present

## 2015-07-17 DIAGNOSIS — M797 Fibromyalgia: Secondary | ICD-10-CM | POA: Diagnosis not present

## 2015-07-17 DIAGNOSIS — M545 Low back pain: Secondary | ICD-10-CM | POA: Diagnosis not present

## 2015-07-17 DIAGNOSIS — M9903 Segmental and somatic dysfunction of lumbar region: Secondary | ICD-10-CM | POA: Diagnosis not present

## 2015-07-25 DIAGNOSIS — M9903 Segmental and somatic dysfunction of lumbar region: Secondary | ICD-10-CM | POA: Diagnosis not present

## 2015-07-25 DIAGNOSIS — M545 Low back pain: Secondary | ICD-10-CM | POA: Diagnosis not present

## 2015-07-25 DIAGNOSIS — M797 Fibromyalgia: Secondary | ICD-10-CM | POA: Diagnosis not present

## 2015-07-25 DIAGNOSIS — M9904 Segmental and somatic dysfunction of sacral region: Secondary | ICD-10-CM | POA: Diagnosis not present

## 2015-08-01 DIAGNOSIS — M542 Cervicalgia: Secondary | ICD-10-CM | POA: Diagnosis not present

## 2015-08-01 DIAGNOSIS — M9901 Segmental and somatic dysfunction of cervical region: Secondary | ICD-10-CM | POA: Diagnosis not present

## 2015-08-01 DIAGNOSIS — M545 Low back pain: Secondary | ICD-10-CM | POA: Diagnosis not present

## 2015-08-01 DIAGNOSIS — M9903 Segmental and somatic dysfunction of lumbar region: Secondary | ICD-10-CM | POA: Diagnosis not present

## 2015-08-08 DIAGNOSIS — M542 Cervicalgia: Secondary | ICD-10-CM | POA: Diagnosis not present

## 2015-08-08 DIAGNOSIS — M545 Low back pain: Secondary | ICD-10-CM | POA: Diagnosis not present

## 2015-08-08 DIAGNOSIS — M9903 Segmental and somatic dysfunction of lumbar region: Secondary | ICD-10-CM | POA: Diagnosis not present

## 2015-08-08 DIAGNOSIS — M9901 Segmental and somatic dysfunction of cervical region: Secondary | ICD-10-CM | POA: Diagnosis not present

## 2015-08-15 DIAGNOSIS — M545 Low back pain: Secondary | ICD-10-CM | POA: Diagnosis not present

## 2015-08-15 DIAGNOSIS — M9903 Segmental and somatic dysfunction of lumbar region: Secondary | ICD-10-CM | POA: Diagnosis not present

## 2015-08-15 DIAGNOSIS — M9901 Segmental and somatic dysfunction of cervical region: Secondary | ICD-10-CM | POA: Diagnosis not present

## 2015-08-15 DIAGNOSIS — M542 Cervicalgia: Secondary | ICD-10-CM | POA: Diagnosis not present

## 2015-09-03 DIAGNOSIS — M545 Low back pain: Secondary | ICD-10-CM | POA: Diagnosis not present

## 2015-09-03 DIAGNOSIS — M9901 Segmental and somatic dysfunction of cervical region: Secondary | ICD-10-CM | POA: Diagnosis not present

## 2015-09-03 DIAGNOSIS — M9903 Segmental and somatic dysfunction of lumbar region: Secondary | ICD-10-CM | POA: Diagnosis not present

## 2015-09-03 DIAGNOSIS — M542 Cervicalgia: Secondary | ICD-10-CM | POA: Diagnosis not present

## 2015-10-01 DIAGNOSIS — M9901 Segmental and somatic dysfunction of cervical region: Secondary | ICD-10-CM | POA: Diagnosis not present

## 2015-10-01 DIAGNOSIS — M9903 Segmental and somatic dysfunction of lumbar region: Secondary | ICD-10-CM | POA: Diagnosis not present

## 2015-10-01 DIAGNOSIS — M542 Cervicalgia: Secondary | ICD-10-CM | POA: Diagnosis not present

## 2015-10-01 DIAGNOSIS — M545 Low back pain: Secondary | ICD-10-CM | POA: Diagnosis not present

## 2015-10-02 DIAGNOSIS — C61 Malignant neoplasm of prostate: Secondary | ICD-10-CM | POA: Diagnosis not present

## 2015-10-02 DIAGNOSIS — R000 Tachycardia, unspecified: Secondary | ICD-10-CM | POA: Diagnosis not present

## 2015-10-02 DIAGNOSIS — F418 Other specified anxiety disorders: Secondary | ICD-10-CM | POA: Diagnosis not present

## 2015-10-02 DIAGNOSIS — I480 Paroxysmal atrial fibrillation: Secondary | ICD-10-CM | POA: Diagnosis not present

## 2015-10-02 DIAGNOSIS — R634 Abnormal weight loss: Secondary | ICD-10-CM | POA: Diagnosis not present

## 2015-10-02 DIAGNOSIS — N400 Enlarged prostate without lower urinary tract symptoms: Secondary | ICD-10-CM | POA: Diagnosis not present

## 2015-10-02 DIAGNOSIS — M1990 Unspecified osteoarthritis, unspecified site: Secondary | ICD-10-CM | POA: Diagnosis not present

## 2015-10-02 DIAGNOSIS — R7309 Other abnormal glucose: Secondary | ICD-10-CM | POA: Diagnosis not present

## 2015-10-02 DIAGNOSIS — G20 Parkinson's disease: Secondary | ICD-10-CM | POA: Diagnosis not present

## 2015-10-02 DIAGNOSIS — E038 Other specified hypothyroidism: Secondary | ICD-10-CM | POA: Diagnosis not present

## 2015-10-02 DIAGNOSIS — E785 Hyperlipidemia, unspecified: Secondary | ICD-10-CM | POA: Diagnosis not present

## 2015-10-02 DIAGNOSIS — R251 Tremor, unspecified: Secondary | ICD-10-CM | POA: Diagnosis not present

## 2015-10-28 DIAGNOSIS — M9903 Segmental and somatic dysfunction of lumbar region: Secondary | ICD-10-CM | POA: Diagnosis not present

## 2015-10-28 DIAGNOSIS — M545 Low back pain: Secondary | ICD-10-CM | POA: Diagnosis not present

## 2015-10-28 DIAGNOSIS — M9901 Segmental and somatic dysfunction of cervical region: Secondary | ICD-10-CM | POA: Diagnosis not present

## 2015-10-28 DIAGNOSIS — M542 Cervicalgia: Secondary | ICD-10-CM | POA: Diagnosis not present

## 2015-11-19 DIAGNOSIS — M545 Low back pain: Secondary | ICD-10-CM | POA: Diagnosis not present

## 2015-11-19 DIAGNOSIS — M9901 Segmental and somatic dysfunction of cervical region: Secondary | ICD-10-CM | POA: Diagnosis not present

## 2015-11-19 DIAGNOSIS — M542 Cervicalgia: Secondary | ICD-10-CM | POA: Diagnosis not present

## 2015-11-19 DIAGNOSIS — M9903 Segmental and somatic dysfunction of lumbar region: Secondary | ICD-10-CM | POA: Diagnosis not present

## 2015-12-12 DIAGNOSIS — C61 Malignant neoplasm of prostate: Secondary | ICD-10-CM | POA: Diagnosis not present

## 2015-12-12 DIAGNOSIS — R300 Dysuria: Secondary | ICD-10-CM | POA: Diagnosis not present

## 2015-12-27 DIAGNOSIS — C61 Malignant neoplasm of prostate: Secondary | ICD-10-CM | POA: Diagnosis not present

## 2015-12-27 DIAGNOSIS — R310 Gross hematuria: Secondary | ICD-10-CM | POA: Diagnosis not present

## 2016-01-13 DIAGNOSIS — M545 Low back pain: Secondary | ICD-10-CM | POA: Diagnosis not present

## 2016-01-13 DIAGNOSIS — M9901 Segmental and somatic dysfunction of cervical region: Secondary | ICD-10-CM | POA: Diagnosis not present

## 2016-01-13 DIAGNOSIS — M9903 Segmental and somatic dysfunction of lumbar region: Secondary | ICD-10-CM | POA: Diagnosis not present

## 2016-01-13 DIAGNOSIS — M542 Cervicalgia: Secondary | ICD-10-CM | POA: Diagnosis not present

## 2016-01-21 DIAGNOSIS — M9903 Segmental and somatic dysfunction of lumbar region: Secondary | ICD-10-CM | POA: Diagnosis not present

## 2016-01-21 DIAGNOSIS — M9901 Segmental and somatic dysfunction of cervical region: Secondary | ICD-10-CM | POA: Diagnosis not present

## 2016-01-21 DIAGNOSIS — M542 Cervicalgia: Secondary | ICD-10-CM | POA: Diagnosis not present

## 2016-01-21 DIAGNOSIS — M545 Low back pain: Secondary | ICD-10-CM | POA: Diagnosis not present

## 2016-03-09 DIAGNOSIS — C61 Malignant neoplasm of prostate: Secondary | ICD-10-CM | POA: Diagnosis not present

## 2016-03-09 DIAGNOSIS — R339 Retention of urine, unspecified: Secondary | ICD-10-CM | POA: Diagnosis not present

## 2016-03-09 DIAGNOSIS — N390 Urinary tract infection, site not specified: Secondary | ICD-10-CM | POA: Diagnosis not present

## 2016-03-26 DIAGNOSIS — M9901 Segmental and somatic dysfunction of cervical region: Secondary | ICD-10-CM | POA: Diagnosis not present

## 2016-03-26 DIAGNOSIS — M9903 Segmental and somatic dysfunction of lumbar region: Secondary | ICD-10-CM | POA: Diagnosis not present

## 2016-03-26 DIAGNOSIS — M542 Cervicalgia: Secondary | ICD-10-CM | POA: Diagnosis not present

## 2016-03-26 DIAGNOSIS — M545 Low back pain: Secondary | ICD-10-CM | POA: Diagnosis not present

## 2016-03-27 DIAGNOSIS — R7301 Impaired fasting glucose: Secondary | ICD-10-CM | POA: Diagnosis not present

## 2016-03-27 DIAGNOSIS — E038 Other specified hypothyroidism: Secondary | ICD-10-CM | POA: Diagnosis not present

## 2016-03-27 DIAGNOSIS — Z125 Encounter for screening for malignant neoplasm of prostate: Secondary | ICD-10-CM | POA: Diagnosis not present

## 2016-03-27 DIAGNOSIS — E784 Other hyperlipidemia: Secondary | ICD-10-CM | POA: Diagnosis not present

## 2016-03-27 DIAGNOSIS — R8290 Unspecified abnormal findings in urine: Secondary | ICD-10-CM | POA: Diagnosis not present

## 2016-04-01 DIAGNOSIS — Z6824 Body mass index (BMI) 24.0-24.9, adult: Secondary | ICD-10-CM | POA: Diagnosis not present

## 2016-04-01 DIAGNOSIS — I480 Paroxysmal atrial fibrillation: Secondary | ICD-10-CM | POA: Diagnosis not present

## 2016-04-01 DIAGNOSIS — C61 Malignant neoplasm of prostate: Secondary | ICD-10-CM | POA: Diagnosis not present

## 2016-04-01 DIAGNOSIS — Z1389 Encounter for screening for other disorder: Secondary | ICD-10-CM | POA: Diagnosis not present

## 2016-04-01 DIAGNOSIS — Z0000 Encounter for general adult medical examination without abnormal findings: Secondary | ICD-10-CM | POA: Diagnosis not present

## 2016-04-01 DIAGNOSIS — E784 Other hyperlipidemia: Secondary | ICD-10-CM | POA: Diagnosis not present

## 2016-04-01 DIAGNOSIS — F418 Other specified anxiety disorders: Secondary | ICD-10-CM | POA: Diagnosis not present

## 2016-04-01 DIAGNOSIS — R251 Tremor, unspecified: Secondary | ICD-10-CM | POA: Diagnosis not present

## 2016-04-01 DIAGNOSIS — M1990 Unspecified osteoarthritis, unspecified site: Secondary | ICD-10-CM | POA: Diagnosis not present

## 2016-04-01 DIAGNOSIS — R000 Tachycardia, unspecified: Secondary | ICD-10-CM | POA: Diagnosis not present

## 2016-04-01 DIAGNOSIS — E039 Hypothyroidism, unspecified: Secondary | ICD-10-CM | POA: Diagnosis not present

## 2016-04-01 DIAGNOSIS — N400 Enlarged prostate without lower urinary tract symptoms: Secondary | ICD-10-CM | POA: Diagnosis not present

## 2016-04-21 DIAGNOSIS — C61 Malignant neoplasm of prostate: Secondary | ICD-10-CM | POA: Diagnosis not present

## 2016-06-18 DIAGNOSIS — Y92099 Unspecified place in other non-institutional residence as the place of occurrence of the external cause: Secondary | ICD-10-CM | POA: Diagnosis not present

## 2016-06-18 DIAGNOSIS — M19032 Primary osteoarthritis, left wrist: Secondary | ICD-10-CM | POA: Diagnosis present

## 2016-06-18 DIAGNOSIS — M7989 Other specified soft tissue disorders: Secondary | ICD-10-CM | POA: Diagnosis not present

## 2016-06-18 DIAGNOSIS — S72002A Fracture of unspecified part of neck of left femur, initial encounter for closed fracture: Secondary | ICD-10-CM | POA: Diagnosis not present

## 2016-06-18 DIAGNOSIS — G250 Essential tremor: Secondary | ICD-10-CM | POA: Diagnosis not present

## 2016-06-18 DIAGNOSIS — M25552 Pain in left hip: Secondary | ICD-10-CM | POA: Diagnosis not present

## 2016-06-18 DIAGNOSIS — J9811 Atelectasis: Secondary | ICD-10-CM | POA: Diagnosis not present

## 2016-06-18 DIAGNOSIS — Z79899 Other long term (current) drug therapy: Secondary | ICD-10-CM | POA: Diagnosis not present

## 2016-06-18 DIAGNOSIS — J690 Pneumonitis due to inhalation of food and vomit: Secondary | ICD-10-CM | POA: Diagnosis not present

## 2016-06-18 DIAGNOSIS — I4891 Unspecified atrial fibrillation: Secondary | ICD-10-CM | POA: Diagnosis not present

## 2016-06-18 DIAGNOSIS — R0902 Hypoxemia: Secondary | ICD-10-CM | POA: Diagnosis present

## 2016-06-18 DIAGNOSIS — E039 Hypothyroidism, unspecified: Secondary | ICD-10-CM | POA: Diagnosis not present

## 2016-06-18 DIAGNOSIS — R112 Nausea with vomiting, unspecified: Secondary | ICD-10-CM | POA: Diagnosis not present

## 2016-06-18 DIAGNOSIS — Z96652 Presence of left artificial knee joint: Secondary | ICD-10-CM | POA: Diagnosis present

## 2016-06-18 DIAGNOSIS — I959 Hypotension, unspecified: Secondary | ICD-10-CM | POA: Diagnosis not present

## 2016-06-18 DIAGNOSIS — T402X5A Adverse effect of other opioids, initial encounter: Secondary | ICD-10-CM | POA: Diagnosis not present

## 2016-06-18 DIAGNOSIS — Z8679 Personal history of other diseases of the circulatory system: Secondary | ICD-10-CM | POA: Diagnosis not present

## 2016-06-18 DIAGNOSIS — R1312 Dysphagia, oropharyngeal phase: Secondary | ICD-10-CM | POA: Diagnosis not present

## 2016-06-18 DIAGNOSIS — K5901 Slow transit constipation: Secondary | ICD-10-CM | POA: Diagnosis not present

## 2016-06-18 DIAGNOSIS — I361 Nonrheumatic tricuspid (valve) insufficiency: Secondary | ICD-10-CM | POA: Diagnosis not present

## 2016-06-18 DIAGNOSIS — S40012A Contusion of left shoulder, initial encounter: Secondary | ICD-10-CM | POA: Diagnosis present

## 2016-06-18 DIAGNOSIS — Z96642 Presence of left artificial hip joint: Secondary | ICD-10-CM | POA: Diagnosis not present

## 2016-06-18 DIAGNOSIS — F419 Anxiety disorder, unspecified: Secondary | ICD-10-CM | POA: Diagnosis not present

## 2016-06-18 DIAGNOSIS — I272 Pulmonary hypertension, unspecified: Secondary | ICD-10-CM | POA: Diagnosis not present

## 2016-06-18 DIAGNOSIS — T148XXA Other injury of unspecified body region, initial encounter: Secondary | ICD-10-CM | POA: Diagnosis not present

## 2016-06-18 DIAGNOSIS — Z471 Aftercare following joint replacement surgery: Secondary | ICD-10-CM | POA: Diagnosis not present

## 2016-06-18 DIAGNOSIS — S72009A Fracture of unspecified part of neck of unspecified femur, initial encounter for closed fracture: Secondary | ICD-10-CM | POA: Diagnosis not present

## 2016-06-18 DIAGNOSIS — I480 Paroxysmal atrial fibrillation: Secondary | ICD-10-CM | POA: Diagnosis not present

## 2016-06-18 DIAGNOSIS — I4892 Unspecified atrial flutter: Secondary | ICD-10-CM | POA: Diagnosis not present

## 2016-06-18 DIAGNOSIS — M545 Low back pain: Secondary | ICD-10-CM | POA: Diagnosis present

## 2016-06-18 DIAGNOSIS — R000 Tachycardia, unspecified: Secondary | ICD-10-CM | POA: Diagnosis not present

## 2016-06-18 DIAGNOSIS — R531 Weakness: Secondary | ICD-10-CM | POA: Diagnosis not present

## 2016-06-18 DIAGNOSIS — J189 Pneumonia, unspecified organism: Secondary | ICD-10-CM | POA: Diagnosis not present

## 2016-06-18 DIAGNOSIS — D696 Thrombocytopenia, unspecified: Secondary | ICD-10-CM | POA: Diagnosis not present

## 2016-06-18 DIAGNOSIS — Z9181 History of falling: Secondary | ICD-10-CM | POA: Diagnosis not present

## 2016-06-18 DIAGNOSIS — K5903 Drug induced constipation: Secondary | ICD-10-CM | POA: Diagnosis not present

## 2016-06-18 DIAGNOSIS — R509 Fever, unspecified: Secondary | ICD-10-CM | POA: Diagnosis not present

## 2016-06-18 DIAGNOSIS — F0630 Mood disorder due to known physiological condition, unspecified: Secondary | ICD-10-CM | POA: Diagnosis not present

## 2016-06-18 DIAGNOSIS — S8992XA Unspecified injury of left lower leg, initial encounter: Secondary | ICD-10-CM | POA: Diagnosis not present

## 2016-06-18 DIAGNOSIS — R05 Cough: Secondary | ICD-10-CM | POA: Diagnosis not present

## 2016-06-18 DIAGNOSIS — G259 Extrapyramidal and movement disorder, unspecified: Secondary | ICD-10-CM | POA: Diagnosis not present

## 2016-06-18 DIAGNOSIS — R5381 Other malaise: Secondary | ICD-10-CM | POA: Diagnosis not present

## 2016-06-18 DIAGNOSIS — M25339 Other instability, unspecified wrist: Secondary | ICD-10-CM | POA: Diagnosis present

## 2016-06-18 DIAGNOSIS — S72002D Fracture of unspecified part of neck of left femur, subsequent encounter for closed fracture with routine healing: Secondary | ICD-10-CM | POA: Diagnosis not present

## 2016-06-18 DIAGNOSIS — G8929 Other chronic pain: Secondary | ICD-10-CM | POA: Diagnosis present

## 2016-06-18 DIAGNOSIS — S6992XA Unspecified injury of left wrist, hand and finger(s), initial encounter: Secondary | ICD-10-CM | POA: Diagnosis not present

## 2016-06-18 DIAGNOSIS — W19XXXA Unspecified fall, initial encounter: Secondary | ICD-10-CM | POA: Diagnosis not present

## 2016-06-18 DIAGNOSIS — I493 Ventricular premature depolarization: Secondary | ICD-10-CM | POA: Diagnosis not present

## 2016-06-18 DIAGNOSIS — S72012A Unspecified intracapsular fracture of left femur, initial encounter for closed fracture: Secondary | ICD-10-CM | POA: Diagnosis not present

## 2016-06-18 DIAGNOSIS — Z9889 Other specified postprocedural states: Secondary | ICD-10-CM | POA: Diagnosis not present

## 2016-06-18 DIAGNOSIS — E869 Volume depletion, unspecified: Secondary | ICD-10-CM | POA: Diagnosis not present

## 2016-06-18 DIAGNOSIS — A419 Sepsis, unspecified organism: Secondary | ICD-10-CM | POA: Diagnosis not present

## 2016-06-28 DIAGNOSIS — M545 Low back pain: Secondary | ICD-10-CM | POA: Diagnosis not present

## 2016-06-28 DIAGNOSIS — J9811 Atelectasis: Secondary | ICD-10-CM | POA: Diagnosis not present

## 2016-06-28 DIAGNOSIS — Z96642 Presence of left artificial hip joint: Secondary | ICD-10-CM | POA: Diagnosis not present

## 2016-06-28 DIAGNOSIS — R1312 Dysphagia, oropharyngeal phase: Secondary | ICD-10-CM | POA: Diagnosis not present

## 2016-06-28 DIAGNOSIS — R531 Weakness: Secondary | ICD-10-CM | POA: Diagnosis not present

## 2016-06-28 DIAGNOSIS — Z471 Aftercare following joint replacement surgery: Secondary | ICD-10-CM | POA: Diagnosis not present

## 2016-06-28 DIAGNOSIS — Z9181 History of falling: Secondary | ICD-10-CM | POA: Diagnosis not present

## 2016-06-28 DIAGNOSIS — M19032 Primary osteoarthritis, left wrist: Secondary | ICD-10-CM | POA: Diagnosis not present

## 2016-06-28 DIAGNOSIS — K5901 Slow transit constipation: Secondary | ICD-10-CM | POA: Diagnosis not present

## 2016-06-28 DIAGNOSIS — E039 Hypothyroidism, unspecified: Secondary | ICD-10-CM | POA: Diagnosis not present

## 2016-06-28 DIAGNOSIS — S72002D Fracture of unspecified part of neck of left femur, subsequent encounter for closed fracture with routine healing: Secondary | ICD-10-CM | POA: Diagnosis not present

## 2016-06-28 DIAGNOSIS — I480 Paroxysmal atrial fibrillation: Secondary | ICD-10-CM | POA: Diagnosis not present

## 2016-06-28 DIAGNOSIS — Z8679 Personal history of other diseases of the circulatory system: Secondary | ICD-10-CM | POA: Diagnosis not present

## 2016-06-28 DIAGNOSIS — F419 Anxiety disorder, unspecified: Secondary | ICD-10-CM | POA: Diagnosis not present

## 2016-06-28 DIAGNOSIS — G250 Essential tremor: Secondary | ICD-10-CM | POA: Diagnosis not present

## 2016-06-28 DIAGNOSIS — R5381 Other malaise: Secondary | ICD-10-CM | POA: Diagnosis not present

## 2016-06-28 DIAGNOSIS — F0630 Mood disorder due to known physiological condition, unspecified: Secondary | ICD-10-CM | POA: Diagnosis not present

## 2016-06-28 DIAGNOSIS — S72009A Fracture of unspecified part of neck of unspecified femur, initial encounter for closed fracture: Secondary | ICD-10-CM | POA: Diagnosis not present

## 2016-06-28 DIAGNOSIS — J189 Pneumonia, unspecified organism: Secondary | ICD-10-CM | POA: Diagnosis not present

## 2016-06-30 DIAGNOSIS — J9811 Atelectasis: Secondary | ICD-10-CM | POA: Diagnosis not present

## 2016-06-30 DIAGNOSIS — R251 Tremor, unspecified: Secondary | ICD-10-CM | POA: Diagnosis not present

## 2016-06-30 DIAGNOSIS — N390 Urinary tract infection, site not specified: Secondary | ICD-10-CM | POA: Diagnosis not present

## 2016-06-30 DIAGNOSIS — R0902 Hypoxemia: Secondary | ICD-10-CM | POA: Diagnosis not present

## 2016-06-30 DIAGNOSIS — J189 Pneumonia, unspecified organism: Secondary | ICD-10-CM | POA: Diagnosis not present

## 2016-06-30 DIAGNOSIS — M6281 Muscle weakness (generalized): Secondary | ICD-10-CM | POA: Diagnosis not present

## 2016-06-30 DIAGNOSIS — R112 Nausea with vomiting, unspecified: Secondary | ICD-10-CM | POA: Diagnosis not present

## 2016-06-30 DIAGNOSIS — Z9181 History of falling: Secondary | ICD-10-CM | POA: Diagnosis not present

## 2016-06-30 DIAGNOSIS — Z4789 Encounter for other orthopedic aftercare: Secondary | ICD-10-CM | POA: Diagnosis not present

## 2016-06-30 DIAGNOSIS — R1310 Dysphagia, unspecified: Secondary | ICD-10-CM | POA: Diagnosis not present

## 2016-06-30 DIAGNOSIS — R2681 Unsteadiness on feet: Secondary | ICD-10-CM | POA: Diagnosis not present

## 2016-06-30 DIAGNOSIS — R1312 Dysphagia, oropharyngeal phase: Secondary | ICD-10-CM | POA: Diagnosis not present

## 2016-06-30 DIAGNOSIS — R278 Other lack of coordination: Secondary | ICD-10-CM | POA: Diagnosis not present

## 2016-06-30 DIAGNOSIS — S72002D Fracture of unspecified part of neck of left femur, subsequent encounter for closed fracture with routine healing: Secondary | ICD-10-CM | POA: Diagnosis not present

## 2016-06-30 DIAGNOSIS — I4891 Unspecified atrial fibrillation: Secondary | ICD-10-CM | POA: Diagnosis not present

## 2016-06-30 DIAGNOSIS — E039 Hypothyroidism, unspecified: Secondary | ICD-10-CM | POA: Diagnosis not present

## 2016-06-30 DIAGNOSIS — R279 Unspecified lack of coordination: Secondary | ICD-10-CM | POA: Diagnosis not present

## 2016-06-30 DIAGNOSIS — Z9889 Other specified postprocedural states: Secondary | ICD-10-CM | POA: Diagnosis not present

## 2016-06-30 DIAGNOSIS — Z8679 Personal history of other diseases of the circulatory system: Secondary | ICD-10-CM | POA: Diagnosis not present

## 2016-06-30 DIAGNOSIS — S7290XA Unspecified fracture of unspecified femur, initial encounter for closed fracture: Secondary | ICD-10-CM | POA: Diagnosis not present

## 2016-06-30 DIAGNOSIS — W19XXXD Unspecified fall, subsequent encounter: Secondary | ICD-10-CM | POA: Diagnosis not present

## 2016-06-30 DIAGNOSIS — R6510 Systemic inflammatory response syndrome (SIRS) of non-infectious origin without acute organ dysfunction: Secondary | ICD-10-CM | POA: Diagnosis not present

## 2016-07-02 DIAGNOSIS — R251 Tremor, unspecified: Secondary | ICD-10-CM | POA: Diagnosis not present

## 2016-07-02 DIAGNOSIS — I4891 Unspecified atrial fibrillation: Secondary | ICD-10-CM | POA: Diagnosis not present

## 2016-07-02 DIAGNOSIS — R1310 Dysphagia, unspecified: Secondary | ICD-10-CM | POA: Diagnosis not present

## 2016-07-02 DIAGNOSIS — S7290XA Unspecified fracture of unspecified femur, initial encounter for closed fracture: Secondary | ICD-10-CM | POA: Diagnosis not present

## 2016-07-02 DIAGNOSIS — R6510 Systemic inflammatory response syndrome (SIRS) of non-infectious origin without acute organ dysfunction: Secondary | ICD-10-CM | POA: Diagnosis not present

## 2016-07-02 DIAGNOSIS — J189 Pneumonia, unspecified organism: Secondary | ICD-10-CM | POA: Diagnosis not present

## 2016-07-06 DIAGNOSIS — S72002D Fracture of unspecified part of neck of left femur, subsequent encounter for closed fracture with routine healing: Secondary | ICD-10-CM | POA: Diagnosis not present

## 2016-07-09 DIAGNOSIS — R112 Nausea with vomiting, unspecified: Secondary | ICD-10-CM | POA: Diagnosis not present

## 2016-07-12 DIAGNOSIS — J189 Pneumonia, unspecified organism: Secondary | ICD-10-CM | POA: Diagnosis not present

## 2016-07-17 DIAGNOSIS — J9811 Atelectasis: Secondary | ICD-10-CM | POA: Diagnosis not present

## 2016-07-17 DIAGNOSIS — I4891 Unspecified atrial fibrillation: Secondary | ICD-10-CM | POA: Diagnosis not present

## 2016-07-17 DIAGNOSIS — Z9889 Other specified postprocedural states: Secondary | ICD-10-CM | POA: Diagnosis not present

## 2016-07-17 DIAGNOSIS — E039 Hypothyroidism, unspecified: Secondary | ICD-10-CM | POA: Diagnosis not present

## 2016-07-17 DIAGNOSIS — J189 Pneumonia, unspecified organism: Secondary | ICD-10-CM | POA: Diagnosis not present

## 2016-07-17 DIAGNOSIS — Z8679 Personal history of other diseases of the circulatory system: Secondary | ICD-10-CM | POA: Diagnosis not present

## 2016-07-18 DIAGNOSIS — N390 Urinary tract infection, site not specified: Secondary | ICD-10-CM | POA: Diagnosis not present

## 2016-07-18 DIAGNOSIS — J189 Pneumonia, unspecified organism: Secondary | ICD-10-CM | POA: Diagnosis not present

## 2016-07-18 DIAGNOSIS — S7290XA Unspecified fracture of unspecified femur, initial encounter for closed fracture: Secondary | ICD-10-CM | POA: Diagnosis not present

## 2016-08-01 DIAGNOSIS — R197 Diarrhea, unspecified: Secondary | ICD-10-CM | POA: Diagnosis not present

## 2016-08-01 DIAGNOSIS — W19XXXD Unspecified fall, subsequent encounter: Secondary | ICD-10-CM | POA: Diagnosis not present

## 2016-08-01 DIAGNOSIS — R2681 Unsteadiness on feet: Secondary | ICD-10-CM | POA: Diagnosis not present

## 2016-08-01 DIAGNOSIS — R1312 Dysphagia, oropharyngeal phase: Secondary | ICD-10-CM | POA: Diagnosis not present

## 2016-08-01 DIAGNOSIS — M6281 Muscle weakness (generalized): Secondary | ICD-10-CM | POA: Diagnosis not present

## 2016-08-02 DIAGNOSIS — W19XXXD Unspecified fall, subsequent encounter: Secondary | ICD-10-CM | POA: Diagnosis not present

## 2016-08-02 DIAGNOSIS — R2681 Unsteadiness on feet: Secondary | ICD-10-CM | POA: Diagnosis not present

## 2016-08-02 DIAGNOSIS — M6281 Muscle weakness (generalized): Secondary | ICD-10-CM | POA: Diagnosis not present

## 2016-08-02 DIAGNOSIS — R1312 Dysphagia, oropharyngeal phase: Secondary | ICD-10-CM | POA: Diagnosis not present

## 2016-08-04 DIAGNOSIS — R2681 Unsteadiness on feet: Secondary | ICD-10-CM | POA: Diagnosis not present

## 2016-08-04 DIAGNOSIS — R1312 Dysphagia, oropharyngeal phase: Secondary | ICD-10-CM | POA: Diagnosis not present

## 2016-08-04 DIAGNOSIS — W19XXXD Unspecified fall, subsequent encounter: Secondary | ICD-10-CM | POA: Diagnosis not present

## 2016-08-04 DIAGNOSIS — M6281 Muscle weakness (generalized): Secondary | ICD-10-CM | POA: Diagnosis not present

## 2016-08-05 DIAGNOSIS — M6281 Muscle weakness (generalized): Secondary | ICD-10-CM | POA: Diagnosis not present

## 2016-08-05 DIAGNOSIS — W19XXXD Unspecified fall, subsequent encounter: Secondary | ICD-10-CM | POA: Diagnosis not present

## 2016-08-05 DIAGNOSIS — R1312 Dysphagia, oropharyngeal phase: Secondary | ICD-10-CM | POA: Diagnosis not present

## 2016-08-05 DIAGNOSIS — R2681 Unsteadiness on feet: Secondary | ICD-10-CM | POA: Diagnosis not present

## 2016-08-06 DIAGNOSIS — W19XXXD Unspecified fall, subsequent encounter: Secondary | ICD-10-CM | POA: Diagnosis not present

## 2016-08-06 DIAGNOSIS — R1312 Dysphagia, oropharyngeal phase: Secondary | ICD-10-CM | POA: Diagnosis not present

## 2016-08-06 DIAGNOSIS — R2681 Unsteadiness on feet: Secondary | ICD-10-CM | POA: Diagnosis not present

## 2016-08-06 DIAGNOSIS — M6281 Muscle weakness (generalized): Secondary | ICD-10-CM | POA: Diagnosis not present

## 2016-08-09 DIAGNOSIS — N390 Urinary tract infection, site not specified: Secondary | ICD-10-CM | POA: Diagnosis not present

## 2016-08-10 DIAGNOSIS — R1312 Dysphagia, oropharyngeal phase: Secondary | ICD-10-CM | POA: Diagnosis not present

## 2016-08-10 DIAGNOSIS — W19XXXD Unspecified fall, subsequent encounter: Secondary | ICD-10-CM | POA: Diagnosis not present

## 2016-08-10 DIAGNOSIS — M6281 Muscle weakness (generalized): Secondary | ICD-10-CM | POA: Diagnosis not present

## 2016-08-10 DIAGNOSIS — R2681 Unsteadiness on feet: Secondary | ICD-10-CM | POA: Diagnosis not present

## 2016-08-11 DIAGNOSIS — R1312 Dysphagia, oropharyngeal phase: Secondary | ICD-10-CM | POA: Diagnosis not present

## 2016-08-11 DIAGNOSIS — M6281 Muscle weakness (generalized): Secondary | ICD-10-CM | POA: Diagnosis not present

## 2016-08-11 DIAGNOSIS — W19XXXD Unspecified fall, subsequent encounter: Secondary | ICD-10-CM | POA: Diagnosis not present

## 2016-08-11 DIAGNOSIS — R2681 Unsteadiness on feet: Secondary | ICD-10-CM | POA: Diagnosis not present

## 2016-08-12 DIAGNOSIS — R1312 Dysphagia, oropharyngeal phase: Secondary | ICD-10-CM | POA: Diagnosis not present

## 2016-08-12 DIAGNOSIS — M6281 Muscle weakness (generalized): Secondary | ICD-10-CM | POA: Diagnosis not present

## 2016-08-12 DIAGNOSIS — R2681 Unsteadiness on feet: Secondary | ICD-10-CM | POA: Diagnosis not present

## 2016-08-12 DIAGNOSIS — W19XXXD Unspecified fall, subsequent encounter: Secondary | ICD-10-CM | POA: Diagnosis not present

## 2016-08-13 DIAGNOSIS — M6281 Muscle weakness (generalized): Secondary | ICD-10-CM | POA: Diagnosis not present

## 2016-08-13 DIAGNOSIS — R1312 Dysphagia, oropharyngeal phase: Secondary | ICD-10-CM | POA: Diagnosis not present

## 2016-08-13 DIAGNOSIS — R2681 Unsteadiness on feet: Secondary | ICD-10-CM | POA: Diagnosis not present

## 2016-08-13 DIAGNOSIS — W19XXXD Unspecified fall, subsequent encounter: Secondary | ICD-10-CM | POA: Diagnosis not present

## 2016-08-14 DIAGNOSIS — R2681 Unsteadiness on feet: Secondary | ICD-10-CM | POA: Diagnosis not present

## 2016-08-14 DIAGNOSIS — R1312 Dysphagia, oropharyngeal phase: Secondary | ICD-10-CM | POA: Diagnosis not present

## 2016-08-14 DIAGNOSIS — W19XXXD Unspecified fall, subsequent encounter: Secondary | ICD-10-CM | POA: Diagnosis not present

## 2016-08-14 DIAGNOSIS — M6281 Muscle weakness (generalized): Secondary | ICD-10-CM | POA: Diagnosis not present

## 2016-08-15 DIAGNOSIS — N390 Urinary tract infection, site not specified: Secondary | ICD-10-CM | POA: Diagnosis not present

## 2016-08-17 DIAGNOSIS — W19XXXD Unspecified fall, subsequent encounter: Secondary | ICD-10-CM | POA: Diagnosis not present

## 2016-08-17 DIAGNOSIS — R2681 Unsteadiness on feet: Secondary | ICD-10-CM | POA: Diagnosis not present

## 2016-08-17 DIAGNOSIS — R1312 Dysphagia, oropharyngeal phase: Secondary | ICD-10-CM | POA: Diagnosis not present

## 2016-08-17 DIAGNOSIS — M6281 Muscle weakness (generalized): Secondary | ICD-10-CM | POA: Diagnosis not present

## 2016-08-18 DIAGNOSIS — M6281 Muscle weakness (generalized): Secondary | ICD-10-CM | POA: Diagnosis not present

## 2016-08-18 DIAGNOSIS — R2681 Unsteadiness on feet: Secondary | ICD-10-CM | POA: Diagnosis not present

## 2016-08-18 DIAGNOSIS — W19XXXD Unspecified fall, subsequent encounter: Secondary | ICD-10-CM | POA: Diagnosis not present

## 2016-08-18 DIAGNOSIS — R1312 Dysphagia, oropharyngeal phase: Secondary | ICD-10-CM | POA: Diagnosis not present

## 2016-08-19 DIAGNOSIS — M6281 Muscle weakness (generalized): Secondary | ICD-10-CM | POA: Diagnosis not present

## 2016-08-19 DIAGNOSIS — W19XXXD Unspecified fall, subsequent encounter: Secondary | ICD-10-CM | POA: Diagnosis not present

## 2016-08-19 DIAGNOSIS — R1312 Dysphagia, oropharyngeal phase: Secondary | ICD-10-CM | POA: Diagnosis not present

## 2016-08-19 DIAGNOSIS — R2681 Unsteadiness on feet: Secondary | ICD-10-CM | POA: Diagnosis not present

## 2016-08-20 DIAGNOSIS — R1312 Dysphagia, oropharyngeal phase: Secondary | ICD-10-CM | POA: Diagnosis not present

## 2016-08-20 DIAGNOSIS — M6281 Muscle weakness (generalized): Secondary | ICD-10-CM | POA: Diagnosis not present

## 2016-08-20 DIAGNOSIS — W19XXXD Unspecified fall, subsequent encounter: Secondary | ICD-10-CM | POA: Diagnosis not present

## 2016-08-20 DIAGNOSIS — R2681 Unsteadiness on feet: Secondary | ICD-10-CM | POA: Diagnosis not present

## 2016-08-21 DIAGNOSIS — R2681 Unsteadiness on feet: Secondary | ICD-10-CM | POA: Diagnosis not present

## 2016-08-21 DIAGNOSIS — R1312 Dysphagia, oropharyngeal phase: Secondary | ICD-10-CM | POA: Diagnosis not present

## 2016-08-21 DIAGNOSIS — W19XXXD Unspecified fall, subsequent encounter: Secondary | ICD-10-CM | POA: Diagnosis not present

## 2016-08-21 DIAGNOSIS — M6281 Muscle weakness (generalized): Secondary | ICD-10-CM | POA: Diagnosis not present

## 2016-08-24 DIAGNOSIS — W19XXXD Unspecified fall, subsequent encounter: Secondary | ICD-10-CM | POA: Diagnosis not present

## 2016-08-24 DIAGNOSIS — R2681 Unsteadiness on feet: Secondary | ICD-10-CM | POA: Diagnosis not present

## 2016-08-24 DIAGNOSIS — R1312 Dysphagia, oropharyngeal phase: Secondary | ICD-10-CM | POA: Diagnosis not present

## 2016-08-24 DIAGNOSIS — M6281 Muscle weakness (generalized): Secondary | ICD-10-CM | POA: Diagnosis not present

## 2016-08-25 DIAGNOSIS — R3915 Urgency of urination: Secondary | ICD-10-CM | POA: Diagnosis not present

## 2016-08-25 DIAGNOSIS — R2681 Unsteadiness on feet: Secondary | ICD-10-CM | POA: Diagnosis not present

## 2016-08-25 DIAGNOSIS — I4891 Unspecified atrial fibrillation: Secondary | ICD-10-CM | POA: Diagnosis not present

## 2016-08-25 DIAGNOSIS — W19XXXD Unspecified fall, subsequent encounter: Secondary | ICD-10-CM | POA: Diagnosis not present

## 2016-08-25 DIAGNOSIS — S72002D Fracture of unspecified part of neck of left femur, subsequent encounter for closed fracture with routine healing: Secondary | ICD-10-CM | POA: Diagnosis not present

## 2016-08-25 DIAGNOSIS — N390 Urinary tract infection, site not specified: Secondary | ICD-10-CM | POA: Diagnosis not present

## 2016-08-25 DIAGNOSIS — M6281 Muscle weakness (generalized): Secondary | ICD-10-CM | POA: Diagnosis not present

## 2016-08-25 DIAGNOSIS — R1312 Dysphagia, oropharyngeal phase: Secondary | ICD-10-CM | POA: Diagnosis not present

## 2016-08-25 DIAGNOSIS — Z4789 Encounter for other orthopedic aftercare: Secondary | ICD-10-CM | POA: Diagnosis not present

## 2016-08-26 DIAGNOSIS — W19XXXD Unspecified fall, subsequent encounter: Secondary | ICD-10-CM | POA: Diagnosis not present

## 2016-08-26 DIAGNOSIS — M6281 Muscle weakness (generalized): Secondary | ICD-10-CM | POA: Diagnosis not present

## 2016-08-26 DIAGNOSIS — R1312 Dysphagia, oropharyngeal phase: Secondary | ICD-10-CM | POA: Diagnosis not present

## 2016-08-26 DIAGNOSIS — R2681 Unsteadiness on feet: Secondary | ICD-10-CM | POA: Diagnosis not present

## 2016-08-27 DIAGNOSIS — R2681 Unsteadiness on feet: Secondary | ICD-10-CM | POA: Diagnosis not present

## 2016-08-27 DIAGNOSIS — R1312 Dysphagia, oropharyngeal phase: Secondary | ICD-10-CM | POA: Diagnosis not present

## 2016-08-27 DIAGNOSIS — M6281 Muscle weakness (generalized): Secondary | ICD-10-CM | POA: Diagnosis not present

## 2016-08-27 DIAGNOSIS — W19XXXD Unspecified fall, subsequent encounter: Secondary | ICD-10-CM | POA: Diagnosis not present

## 2016-08-28 DIAGNOSIS — R1312 Dysphagia, oropharyngeal phase: Secondary | ICD-10-CM | POA: Diagnosis not present

## 2016-08-28 DIAGNOSIS — R2681 Unsteadiness on feet: Secondary | ICD-10-CM | POA: Diagnosis not present

## 2016-08-28 DIAGNOSIS — M6281 Muscle weakness (generalized): Secondary | ICD-10-CM | POA: Diagnosis not present

## 2016-08-28 DIAGNOSIS — W19XXXD Unspecified fall, subsequent encounter: Secondary | ICD-10-CM | POA: Diagnosis not present

## 2016-09-18 DIAGNOSIS — C61 Malignant neoplasm of prostate: Secondary | ICD-10-CM | POA: Diagnosis not present

## 2016-09-18 DIAGNOSIS — R3129 Other microscopic hematuria: Secondary | ICD-10-CM | POA: Diagnosis not present

## 2016-09-29 DIAGNOSIS — Z6822 Body mass index (BMI) 22.0-22.9, adult: Secondary | ICD-10-CM | POA: Diagnosis not present

## 2016-09-29 DIAGNOSIS — G20 Parkinson's disease: Secondary | ICD-10-CM | POA: Diagnosis not present

## 2016-09-29 DIAGNOSIS — R2689 Other abnormalities of gait and mobility: Secondary | ICD-10-CM | POA: Diagnosis not present

## 2016-09-29 DIAGNOSIS — E039 Hypothyroidism, unspecified: Secondary | ICD-10-CM | POA: Diagnosis not present

## 2016-09-29 DIAGNOSIS — I480 Paroxysmal atrial fibrillation: Secondary | ICD-10-CM | POA: Diagnosis not present

## 2016-09-29 DIAGNOSIS — M1612 Unilateral primary osteoarthritis, left hip: Secondary | ICD-10-CM | POA: Diagnosis not present

## 2016-10-02 DIAGNOSIS — N359 Urethral stricture, unspecified: Secondary | ICD-10-CM | POA: Diagnosis not present

## 2016-10-14 DIAGNOSIS — I4891 Unspecified atrial fibrillation: Secondary | ICD-10-CM | POA: Diagnosis not present

## 2016-10-16 DIAGNOSIS — C61 Malignant neoplasm of prostate: Secondary | ICD-10-CM | POA: Diagnosis not present

## 2017-01-05 DIAGNOSIS — J189 Pneumonia, unspecified organism: Secondary | ICD-10-CM | POA: Diagnosis not present

## 2017-01-05 DIAGNOSIS — G250 Essential tremor: Secondary | ICD-10-CM | POA: Diagnosis not present

## 2017-01-05 DIAGNOSIS — I4891 Unspecified atrial fibrillation: Secondary | ICD-10-CM | POA: Diagnosis not present

## 2017-01-05 DIAGNOSIS — R000 Tachycardia, unspecified: Secondary | ICD-10-CM | POA: Diagnosis not present

## 2017-01-05 DIAGNOSIS — K219 Gastro-esophageal reflux disease without esophagitis: Secondary | ICD-10-CM | POA: Diagnosis not present

## 2017-01-05 DIAGNOSIS — F419 Anxiety disorder, unspecified: Secondary | ICD-10-CM | POA: Diagnosis not present

## 2017-01-05 DIAGNOSIS — R071 Chest pain on breathing: Secondary | ICD-10-CM | POA: Diagnosis not present

## 2017-01-05 DIAGNOSIS — E039 Hypothyroidism, unspecified: Secondary | ICD-10-CM | POA: Diagnosis not present

## 2017-01-05 DIAGNOSIS — R918 Other nonspecific abnormal finding of lung field: Secondary | ICD-10-CM | POA: Diagnosis not present

## 2017-01-05 DIAGNOSIS — R079 Chest pain, unspecified: Secondary | ICD-10-CM | POA: Diagnosis not present

## 2017-01-05 DIAGNOSIS — R0789 Other chest pain: Secondary | ICD-10-CM | POA: Diagnosis not present

## 2017-01-05 DIAGNOSIS — R072 Precordial pain: Secondary | ICD-10-CM | POA: Diagnosis not present

## 2017-01-06 DIAGNOSIS — K219 Gastro-esophageal reflux disease without esophagitis: Secondary | ICD-10-CM | POA: Diagnosis present

## 2017-01-06 DIAGNOSIS — Z79891 Long term (current) use of opiate analgesic: Secondary | ICD-10-CM | POA: Diagnosis not present

## 2017-01-06 DIAGNOSIS — J189 Pneumonia, unspecified organism: Secondary | ICD-10-CM | POA: Diagnosis present

## 2017-01-06 DIAGNOSIS — Z7982 Long term (current) use of aspirin: Secondary | ICD-10-CM | POA: Diagnosis not present

## 2017-01-06 DIAGNOSIS — I4891 Unspecified atrial fibrillation: Secondary | ICD-10-CM | POA: Diagnosis present

## 2017-01-06 DIAGNOSIS — F419 Anxiety disorder, unspecified: Secondary | ICD-10-CM | POA: Diagnosis present

## 2017-01-06 DIAGNOSIS — R079 Chest pain, unspecified: Secondary | ICD-10-CM | POA: Diagnosis not present

## 2017-01-06 DIAGNOSIS — R0789 Other chest pain: Secondary | ICD-10-CM | POA: Diagnosis present

## 2017-01-06 DIAGNOSIS — G250 Essential tremor: Secondary | ICD-10-CM | POA: Diagnosis present

## 2017-01-06 DIAGNOSIS — E039 Hypothyroidism, unspecified: Secondary | ICD-10-CM | POA: Diagnosis present

## 2017-01-09 DIAGNOSIS — I358 Other nonrheumatic aortic valve disorders: Secondary | ICD-10-CM | POA: Diagnosis not present

## 2017-01-09 DIAGNOSIS — E039 Hypothyroidism, unspecified: Secondary | ICD-10-CM | POA: Diagnosis not present

## 2017-01-09 DIAGNOSIS — Z8739 Personal history of other diseases of the musculoskeletal system and connective tissue: Secondary | ICD-10-CM | POA: Diagnosis not present

## 2017-01-09 DIAGNOSIS — I071 Rheumatic tricuspid insufficiency: Secondary | ICD-10-CM | POA: Diagnosis present

## 2017-01-09 DIAGNOSIS — G20 Parkinson's disease: Secondary | ICD-10-CM | POA: Diagnosis not present

## 2017-01-09 DIAGNOSIS — G250 Essential tremor: Secondary | ICD-10-CM | POA: Diagnosis not present

## 2017-01-09 DIAGNOSIS — R000 Tachycardia, unspecified: Secondary | ICD-10-CM | POA: Diagnosis not present

## 2017-01-09 DIAGNOSIS — R002 Palpitations: Secondary | ICD-10-CM | POA: Diagnosis not present

## 2017-01-09 DIAGNOSIS — J189 Pneumonia, unspecified organism: Secondary | ICD-10-CM | POA: Diagnosis not present

## 2017-01-09 DIAGNOSIS — R079 Chest pain, unspecified: Secondary | ICD-10-CM | POA: Diagnosis not present

## 2017-01-09 DIAGNOSIS — I480 Paroxysmal atrial fibrillation: Secondary | ICD-10-CM | POA: Diagnosis not present

## 2017-01-09 DIAGNOSIS — I4891 Unspecified atrial fibrillation: Secondary | ICD-10-CM | POA: Diagnosis not present

## 2017-01-20 DIAGNOSIS — Z6823 Body mass index (BMI) 23.0-23.9, adult: Secondary | ICD-10-CM | POA: Diagnosis not present

## 2017-01-20 DIAGNOSIS — J189 Pneumonia, unspecified organism: Secondary | ICD-10-CM | POA: Diagnosis not present

## 2017-01-20 DIAGNOSIS — I480 Paroxysmal atrial fibrillation: Secondary | ICD-10-CM | POA: Diagnosis not present

## 2017-01-20 DIAGNOSIS — G20 Parkinson's disease: Secondary | ICD-10-CM | POA: Diagnosis not present

## 2017-01-20 DIAGNOSIS — R2689 Other abnormalities of gait and mobility: Secondary | ICD-10-CM | POA: Diagnosis not present

## 2017-01-25 DIAGNOSIS — F419 Anxiety disorder, unspecified: Secondary | ICD-10-CM | POA: Diagnosis not present

## 2017-01-25 DIAGNOSIS — G3183 Dementia with Lewy bodies: Secondary | ICD-10-CM | POA: Diagnosis not present

## 2017-01-25 DIAGNOSIS — M1612 Unilateral primary osteoarthritis, left hip: Secondary | ICD-10-CM | POA: Diagnosis not present

## 2017-01-25 DIAGNOSIS — I4891 Unspecified atrial fibrillation: Secondary | ICD-10-CM | POA: Diagnosis not present

## 2017-01-25 DIAGNOSIS — E039 Hypothyroidism, unspecified: Secondary | ICD-10-CM | POA: Diagnosis not present

## 2017-01-25 DIAGNOSIS — F329 Major depressive disorder, single episode, unspecified: Secondary | ICD-10-CM | POA: Diagnosis not present

## 2017-01-25 DIAGNOSIS — Z8701 Personal history of pneumonia (recurrent): Secondary | ICD-10-CM | POA: Diagnosis not present

## 2017-01-25 DIAGNOSIS — F0280 Dementia in other diseases classified elsewhere without behavioral disturbance: Secondary | ICD-10-CM | POA: Diagnosis not present

## 2017-01-25 DIAGNOSIS — Z8546 Personal history of malignant neoplasm of prostate: Secondary | ICD-10-CM | POA: Diagnosis not present

## 2017-01-25 DIAGNOSIS — E785 Hyperlipidemia, unspecified: Secondary | ICD-10-CM | POA: Diagnosis not present

## 2017-01-26 ENCOUNTER — Encounter: Payer: Self-pay | Admitting: *Deleted

## 2017-01-26 ENCOUNTER — Other Ambulatory Visit: Payer: Self-pay | Admitting: *Deleted

## 2017-01-26 NOTE — Patient Outreach (Signed)
[facility]) Care Management  01/26/2017  Nathan Stanley 1926/10/01 [id number]  Telephone Screen  Referral Date:01/26/17 Referral Source: EMMI Prevernt Referral Reason: Cancer, A Fib Insurance: Medicare, [employer] attempt # 1 to patient. Patient received an outside phone call, requested a call back.  Plan: RN CM will contact patient in 1 business day.  Lake Bells, RN, BSN, MHA/MSL, [place] Telephonic Care Manager Coordinator [facility] Direct Phone: [phone] Toll Free: [phone] Fax: [phone]

## 2017-01-27 DIAGNOSIS — G3183 Dementia with Lewy bodies: Secondary | ICD-10-CM | POA: Diagnosis not present

## 2017-01-27 DIAGNOSIS — Z8701 Personal history of pneumonia (recurrent): Secondary | ICD-10-CM | POA: Diagnosis not present

## 2017-01-27 DIAGNOSIS — F329 Major depressive disorder, single episode, unspecified: Secondary | ICD-10-CM | POA: Diagnosis not present

## 2017-01-27 DIAGNOSIS — F0280 Dementia in other diseases classified elsewhere without behavioral disturbance: Secondary | ICD-10-CM | POA: Diagnosis not present

## 2017-01-27 DIAGNOSIS — I4891 Unspecified atrial fibrillation: Secondary | ICD-10-CM | POA: Diagnosis not present

## 2017-01-27 DIAGNOSIS — M1612 Unilateral primary osteoarthritis, left hip: Secondary | ICD-10-CM | POA: Diagnosis not present

## 2017-01-28 DIAGNOSIS — M1612 Unilateral primary osteoarthritis, left hip: Secondary | ICD-10-CM | POA: Diagnosis not present

## 2017-01-28 DIAGNOSIS — F0280 Dementia in other diseases classified elsewhere without behavioral disturbance: Secondary | ICD-10-CM | POA: Diagnosis not present

## 2017-01-28 DIAGNOSIS — I4891 Unspecified atrial fibrillation: Secondary | ICD-10-CM | POA: Diagnosis not present

## 2017-01-28 DIAGNOSIS — G3183 Dementia with Lewy bodies: Secondary | ICD-10-CM | POA: Diagnosis not present

## 2017-01-28 DIAGNOSIS — F329 Major depressive disorder, single episode, unspecified: Secondary | ICD-10-CM | POA: Diagnosis not present

## 2017-01-28 DIAGNOSIS — Z8701 Personal history of pneumonia (recurrent): Secondary | ICD-10-CM | POA: Diagnosis not present

## 2017-02-01 DIAGNOSIS — I4891 Unspecified atrial fibrillation: Secondary | ICD-10-CM | POA: Diagnosis not present

## 2017-02-01 DIAGNOSIS — F329 Major depressive disorder, single episode, unspecified: Secondary | ICD-10-CM | POA: Diagnosis not present

## 2017-02-01 DIAGNOSIS — F0280 Dementia in other diseases classified elsewhere without behavioral disturbance: Secondary | ICD-10-CM | POA: Diagnosis not present

## 2017-02-01 DIAGNOSIS — Z8701 Personal history of pneumonia (recurrent): Secondary | ICD-10-CM | POA: Diagnosis not present

## 2017-02-01 DIAGNOSIS — G3183 Dementia with Lewy bodies: Secondary | ICD-10-CM | POA: Diagnosis not present

## 2017-02-01 DIAGNOSIS — M1612 Unilateral primary osteoarthritis, left hip: Secondary | ICD-10-CM | POA: Diagnosis not present

## 2017-02-02 DIAGNOSIS — M1612 Unilateral primary osteoarthritis, left hip: Secondary | ICD-10-CM | POA: Diagnosis not present

## 2017-02-02 DIAGNOSIS — G3183 Dementia with Lewy bodies: Secondary | ICD-10-CM | POA: Diagnosis not present

## 2017-02-02 DIAGNOSIS — I4891 Unspecified atrial fibrillation: Secondary | ICD-10-CM | POA: Diagnosis not present

## 2017-02-02 DIAGNOSIS — F329 Major depressive disorder, single episode, unspecified: Secondary | ICD-10-CM | POA: Diagnosis not present

## 2017-02-02 DIAGNOSIS — Z8701 Personal history of pneumonia (recurrent): Secondary | ICD-10-CM | POA: Diagnosis not present

## 2017-02-02 DIAGNOSIS — F0280 Dementia in other diseases classified elsewhere without behavioral disturbance: Secondary | ICD-10-CM | POA: Diagnosis not present

## 2017-02-03 ENCOUNTER — Other Ambulatory Visit: Payer: Self-pay | Admitting: *Deleted

## 2017-02-03 ENCOUNTER — Ambulatory Visit: Payer: Self-pay | Admitting: *Deleted

## 2017-02-03 DIAGNOSIS — F0280 Dementia in other diseases classified elsewhere without behavioral disturbance: Secondary | ICD-10-CM | POA: Diagnosis not present

## 2017-02-03 DIAGNOSIS — F329 Major depressive disorder, single episode, unspecified: Secondary | ICD-10-CM | POA: Diagnosis not present

## 2017-02-03 DIAGNOSIS — G3183 Dementia with Lewy bodies: Secondary | ICD-10-CM | POA: Diagnosis not present

## 2017-02-03 DIAGNOSIS — I4891 Unspecified atrial fibrillation: Secondary | ICD-10-CM | POA: Diagnosis not present

## 2017-02-03 DIAGNOSIS — M1612 Unilateral primary osteoarthritis, left hip: Secondary | ICD-10-CM | POA: Diagnosis not present

## 2017-02-03 DIAGNOSIS — Z8701 Personal history of pneumonia (recurrent): Secondary | ICD-10-CM | POA: Diagnosis not present

## 2017-02-03 NOTE — Patient Outreach (Signed)
[facility]) Care Management  02/03/2017  DASHUN BORRE Nov 08, 1926 [id number]  Telephone Screen  Referral Date:01/26/17 Referral Source: EMMI Prevernt Referral Reason: Cancer, A Fib Insurance: Medicare, [employer] attempt # 2 to patient. Patient unavailable, requested a call back.  Plan: RN CM will contact patient in 1 business day.   Lake Bells, RN, BSN, MHA/MSL, [place] Telephonic Care Manager Coordinator [facility] Direct Phone: [phone] Toll Free: [phone] Fax: [phone]

## 2017-02-04 ENCOUNTER — Other Ambulatory Visit: Payer: Self-pay | Admitting: *Deleted

## 2017-02-04 ENCOUNTER — Ambulatory Visit: Payer: Self-pay | Admitting: *Deleted

## 2017-02-04 NOTE — Patient Outreach (Signed)
[facility]) Care Management  02/04/2017  Nathan Stanley 06-25-1927 [id number]    Telephone Screen  Referral Date:01/26/17 Referral Source: EMMI Prevernt Referral Reason: Cancer, A Fib Insurance: Medicare, [employer] attempt # 3, spoke with patient. HIPAA verified with patient. Patient reported, he is in a rehab facility. Patient verbalized, he was feeling weak and had decreased mobility. His PCP recommended rehab, per patient. Patient offered [facility] and he declined. He stated, he has 24 hour caregivers and nurses. He feels confident that someone from the facility will help him transition home when appropriate.    Plan: RN CM will notify [facility] CM administrative assistant regarding case closure.    Lake Bells, RN, BSN, MHA/MSL, [place] Telephonic Care Manager Coordinator [facility] Direct Phone: [phone] Toll Free: [phone] Fax: [phone]

## 2017-02-05 DIAGNOSIS — G3183 Dementia with Lewy bodies: Secondary | ICD-10-CM | POA: Diagnosis not present

## 2017-02-05 DIAGNOSIS — M1612 Unilateral primary osteoarthritis, left hip: Secondary | ICD-10-CM | POA: Diagnosis not present

## 2017-02-05 DIAGNOSIS — I4891 Unspecified atrial fibrillation: Secondary | ICD-10-CM | POA: Diagnosis not present

## 2017-02-05 DIAGNOSIS — F0280 Dementia in other diseases classified elsewhere without behavioral disturbance: Secondary | ICD-10-CM | POA: Diagnosis not present

## 2017-02-05 DIAGNOSIS — Z8701 Personal history of pneumonia (recurrent): Secondary | ICD-10-CM | POA: Diagnosis not present

## 2017-02-05 DIAGNOSIS — F329 Major depressive disorder, single episode, unspecified: Secondary | ICD-10-CM | POA: Diagnosis not present

## 2017-02-09 DIAGNOSIS — G3183 Dementia with Lewy bodies: Secondary | ICD-10-CM | POA: Diagnosis not present

## 2017-02-09 DIAGNOSIS — M1612 Unilateral primary osteoarthritis, left hip: Secondary | ICD-10-CM | POA: Diagnosis not present

## 2017-02-09 DIAGNOSIS — F0280 Dementia in other diseases classified elsewhere without behavioral disturbance: Secondary | ICD-10-CM | POA: Diagnosis not present

## 2017-02-09 DIAGNOSIS — Z8701 Personal history of pneumonia (recurrent): Secondary | ICD-10-CM | POA: Diagnosis not present

## 2017-02-09 DIAGNOSIS — F329 Major depressive disorder, single episode, unspecified: Secondary | ICD-10-CM | POA: Diagnosis not present

## 2017-02-09 DIAGNOSIS — I4891 Unspecified atrial fibrillation: Secondary | ICD-10-CM | POA: Diagnosis not present

## 2017-02-11 DIAGNOSIS — F0280 Dementia in other diseases classified elsewhere without behavioral disturbance: Secondary | ICD-10-CM | POA: Diagnosis not present

## 2017-02-11 DIAGNOSIS — F329 Major depressive disorder, single episode, unspecified: Secondary | ICD-10-CM | POA: Diagnosis not present

## 2017-02-11 DIAGNOSIS — M1612 Unilateral primary osteoarthritis, left hip: Secondary | ICD-10-CM | POA: Diagnosis not present

## 2017-02-11 DIAGNOSIS — G3183 Dementia with Lewy bodies: Secondary | ICD-10-CM | POA: Diagnosis not present

## 2017-02-11 DIAGNOSIS — I4891 Unspecified atrial fibrillation: Secondary | ICD-10-CM | POA: Diagnosis not present

## 2017-02-11 DIAGNOSIS — Z8701 Personal history of pneumonia (recurrent): Secondary | ICD-10-CM | POA: Diagnosis not present

## 2017-02-16 DIAGNOSIS — I4891 Unspecified atrial fibrillation: Secondary | ICD-10-CM | POA: Diagnosis not present

## 2017-02-16 DIAGNOSIS — F0280 Dementia in other diseases classified elsewhere without behavioral disturbance: Secondary | ICD-10-CM | POA: Diagnosis not present

## 2017-02-16 DIAGNOSIS — M1612 Unilateral primary osteoarthritis, left hip: Secondary | ICD-10-CM | POA: Diagnosis not present

## 2017-02-16 DIAGNOSIS — G3183 Dementia with Lewy bodies: Secondary | ICD-10-CM | POA: Diagnosis not present

## 2017-02-16 DIAGNOSIS — F329 Major depressive disorder, single episode, unspecified: Secondary | ICD-10-CM | POA: Diagnosis not present

## 2017-02-16 DIAGNOSIS — Z8701 Personal history of pneumonia (recurrent): Secondary | ICD-10-CM | POA: Diagnosis not present

## 2017-02-17 DIAGNOSIS — I4891 Unspecified atrial fibrillation: Secondary | ICD-10-CM | POA: Diagnosis not present

## 2017-02-17 DIAGNOSIS — M1612 Unilateral primary osteoarthritis, left hip: Secondary | ICD-10-CM | POA: Diagnosis not present

## 2017-02-17 DIAGNOSIS — F0280 Dementia in other diseases classified elsewhere without behavioral disturbance: Secondary | ICD-10-CM | POA: Diagnosis not present

## 2017-02-17 DIAGNOSIS — F329 Major depressive disorder, single episode, unspecified: Secondary | ICD-10-CM | POA: Diagnosis not present

## 2017-02-17 DIAGNOSIS — Z8701 Personal history of pneumonia (recurrent): Secondary | ICD-10-CM | POA: Diagnosis not present

## 2017-02-17 DIAGNOSIS — G3183 Dementia with Lewy bodies: Secondary | ICD-10-CM | POA: Diagnosis not present

## 2017-02-24 DIAGNOSIS — F0280 Dementia in other diseases classified elsewhere without behavioral disturbance: Secondary | ICD-10-CM | POA: Diagnosis not present

## 2017-02-24 DIAGNOSIS — F329 Major depressive disorder, single episode, unspecified: Secondary | ICD-10-CM | POA: Diagnosis not present

## 2017-02-24 DIAGNOSIS — I4891 Unspecified atrial fibrillation: Secondary | ICD-10-CM | POA: Diagnosis not present

## 2017-02-24 DIAGNOSIS — G3183 Dementia with Lewy bodies: Secondary | ICD-10-CM | POA: Diagnosis not present

## 2017-02-24 DIAGNOSIS — M1612 Unilateral primary osteoarthritis, left hip: Secondary | ICD-10-CM | POA: Diagnosis not present

## 2017-02-24 DIAGNOSIS — Z8701 Personal history of pneumonia (recurrent): Secondary | ICD-10-CM | POA: Diagnosis not present

## 2017-03-03 DIAGNOSIS — F329 Major depressive disorder, single episode, unspecified: Secondary | ICD-10-CM | POA: Diagnosis not present

## 2017-03-03 DIAGNOSIS — Z8701 Personal history of pneumonia (recurrent): Secondary | ICD-10-CM | POA: Diagnosis not present

## 2017-03-03 DIAGNOSIS — M1612 Unilateral primary osteoarthritis, left hip: Secondary | ICD-10-CM | POA: Diagnosis not present

## 2017-03-03 DIAGNOSIS — G3183 Dementia with Lewy bodies: Secondary | ICD-10-CM | POA: Diagnosis not present

## 2017-03-03 DIAGNOSIS — I4891 Unspecified atrial fibrillation: Secondary | ICD-10-CM | POA: Diagnosis not present

## 2017-03-03 DIAGNOSIS — F0280 Dementia in other diseases classified elsewhere without behavioral disturbance: Secondary | ICD-10-CM | POA: Diagnosis not present

## 2017-03-04 DIAGNOSIS — F329 Major depressive disorder, single episode, unspecified: Secondary | ICD-10-CM | POA: Diagnosis not present

## 2017-03-04 DIAGNOSIS — M1612 Unilateral primary osteoarthritis, left hip: Secondary | ICD-10-CM | POA: Diagnosis not present

## 2017-03-04 DIAGNOSIS — I4891 Unspecified atrial fibrillation: Secondary | ICD-10-CM | POA: Diagnosis not present

## 2017-03-04 DIAGNOSIS — F0280 Dementia in other diseases classified elsewhere without behavioral disturbance: Secondary | ICD-10-CM | POA: Diagnosis not present

## 2017-03-04 DIAGNOSIS — Z8701 Personal history of pneumonia (recurrent): Secondary | ICD-10-CM | POA: Diagnosis not present

## 2017-03-04 DIAGNOSIS — G3183 Dementia with Lewy bodies: Secondary | ICD-10-CM | POA: Diagnosis not present

## 2017-03-10 DIAGNOSIS — M1612 Unilateral primary osteoarthritis, left hip: Secondary | ICD-10-CM | POA: Diagnosis not present

## 2017-03-10 DIAGNOSIS — F0280 Dementia in other diseases classified elsewhere without behavioral disturbance: Secondary | ICD-10-CM | POA: Diagnosis not present

## 2017-03-10 DIAGNOSIS — F329 Major depressive disorder, single episode, unspecified: Secondary | ICD-10-CM | POA: Diagnosis not present

## 2017-03-10 DIAGNOSIS — Z8701 Personal history of pneumonia (recurrent): Secondary | ICD-10-CM | POA: Diagnosis not present

## 2017-03-10 DIAGNOSIS — G3183 Dementia with Lewy bodies: Secondary | ICD-10-CM | POA: Diagnosis not present

## 2017-03-10 DIAGNOSIS — I4891 Unspecified atrial fibrillation: Secondary | ICD-10-CM | POA: Diagnosis not present

## 2017-03-11 DIAGNOSIS — M1612 Unilateral primary osteoarthritis, left hip: Secondary | ICD-10-CM | POA: Diagnosis not present

## 2017-03-11 DIAGNOSIS — I4891 Unspecified atrial fibrillation: Secondary | ICD-10-CM | POA: Diagnosis not present

## 2017-03-11 DIAGNOSIS — G3183 Dementia with Lewy bodies: Secondary | ICD-10-CM | POA: Diagnosis not present

## 2017-03-11 DIAGNOSIS — F329 Major depressive disorder, single episode, unspecified: Secondary | ICD-10-CM | POA: Diagnosis not present

## 2017-03-11 DIAGNOSIS — F0280 Dementia in other diseases classified elsewhere without behavioral disturbance: Secondary | ICD-10-CM | POA: Diagnosis not present

## 2017-03-11 DIAGNOSIS — Z8701 Personal history of pneumonia (recurrent): Secondary | ICD-10-CM | POA: Diagnosis not present

## 2017-03-17 DIAGNOSIS — M1612 Unilateral primary osteoarthritis, left hip: Secondary | ICD-10-CM | POA: Diagnosis not present

## 2017-03-17 DIAGNOSIS — F0280 Dementia in other diseases classified elsewhere without behavioral disturbance: Secondary | ICD-10-CM | POA: Diagnosis not present

## 2017-03-17 DIAGNOSIS — F329 Major depressive disorder, single episode, unspecified: Secondary | ICD-10-CM | POA: Diagnosis not present

## 2017-03-17 DIAGNOSIS — Z8701 Personal history of pneumonia (recurrent): Secondary | ICD-10-CM | POA: Diagnosis not present

## 2017-03-17 DIAGNOSIS — G3183 Dementia with Lewy bodies: Secondary | ICD-10-CM | POA: Diagnosis not present

## 2017-03-17 DIAGNOSIS — I4891 Unspecified atrial fibrillation: Secondary | ICD-10-CM | POA: Diagnosis not present

## 2017-03-23 DIAGNOSIS — M1612 Unilateral primary osteoarthritis, left hip: Secondary | ICD-10-CM | POA: Diagnosis not present

## 2017-03-23 DIAGNOSIS — I4891 Unspecified atrial fibrillation: Secondary | ICD-10-CM | POA: Diagnosis not present

## 2017-03-23 DIAGNOSIS — Z8701 Personal history of pneumonia (recurrent): Secondary | ICD-10-CM | POA: Diagnosis not present

## 2017-03-23 DIAGNOSIS — G3183 Dementia with Lewy bodies: Secondary | ICD-10-CM | POA: Diagnosis not present

## 2017-03-23 DIAGNOSIS — F329 Major depressive disorder, single episode, unspecified: Secondary | ICD-10-CM | POA: Diagnosis not present

## 2017-03-23 DIAGNOSIS — F0280 Dementia in other diseases classified elsewhere without behavioral disturbance: Secondary | ICD-10-CM | POA: Diagnosis not present

## 2017-03-24 DIAGNOSIS — Z8701 Personal history of pneumonia (recurrent): Secondary | ICD-10-CM | POA: Diagnosis not present

## 2017-03-24 DIAGNOSIS — I4891 Unspecified atrial fibrillation: Secondary | ICD-10-CM | POA: Diagnosis not present

## 2017-03-24 DIAGNOSIS — F0280 Dementia in other diseases classified elsewhere without behavioral disturbance: Secondary | ICD-10-CM | POA: Diagnosis not present

## 2017-03-24 DIAGNOSIS — M1612 Unilateral primary osteoarthritis, left hip: Secondary | ICD-10-CM | POA: Diagnosis not present

## 2017-03-24 DIAGNOSIS — F329 Major depressive disorder, single episode, unspecified: Secondary | ICD-10-CM | POA: Diagnosis not present

## 2017-03-24 DIAGNOSIS — G3183 Dementia with Lewy bodies: Secondary | ICD-10-CM | POA: Diagnosis not present

## 2017-04-05 DIAGNOSIS — Z0000 Encounter for general adult medical examination without abnormal findings: Secondary | ICD-10-CM | POA: Diagnosis not present

## 2017-04-05 DIAGNOSIS — Z125 Encounter for screening for malignant neoplasm of prostate: Secondary | ICD-10-CM | POA: Diagnosis not present

## 2017-04-05 DIAGNOSIS — R7301 Impaired fasting glucose: Secondary | ICD-10-CM | POA: Diagnosis not present

## 2017-04-05 DIAGNOSIS — E7849 Other hyperlipidemia: Secondary | ICD-10-CM | POA: Diagnosis not present

## 2017-04-05 DIAGNOSIS — R82998 Other abnormal findings in urine: Secondary | ICD-10-CM | POA: Diagnosis not present

## 2017-04-05 DIAGNOSIS — E038 Other specified hypothyroidism: Secondary | ICD-10-CM | POA: Diagnosis not present

## 2017-04-12 DIAGNOSIS — R7302 Impaired glucose tolerance (oral): Secondary | ICD-10-CM | POA: Diagnosis not present

## 2017-04-12 DIAGNOSIS — Z1389 Encounter for screening for other disorder: Secondary | ICD-10-CM | POA: Diagnosis not present

## 2017-04-12 DIAGNOSIS — R000 Tachycardia, unspecified: Secondary | ICD-10-CM | POA: Diagnosis not present

## 2017-04-12 DIAGNOSIS — R2689 Other abnormalities of gait and mobility: Secondary | ICD-10-CM | POA: Diagnosis not present

## 2017-04-12 DIAGNOSIS — E038 Other specified hypothyroidism: Secondary | ICD-10-CM | POA: Diagnosis not present

## 2017-04-12 DIAGNOSIS — Z6823 Body mass index (BMI) 23.0-23.9, adult: Secondary | ICD-10-CM | POA: Diagnosis not present

## 2017-04-12 DIAGNOSIS — E7849 Other hyperlipidemia: Secondary | ICD-10-CM | POA: Diagnosis not present

## 2017-04-12 DIAGNOSIS — G20 Parkinson's disease: Secondary | ICD-10-CM | POA: Diagnosis not present

## 2017-04-12 DIAGNOSIS — Z23 Encounter for immunization: Secondary | ICD-10-CM | POA: Diagnosis not present

## 2017-04-12 DIAGNOSIS — I480 Paroxysmal atrial fibrillation: Secondary | ICD-10-CM | POA: Diagnosis not present

## 2017-04-12 DIAGNOSIS — Z0000 Encounter for general adult medical examination without abnormal findings: Secondary | ICD-10-CM | POA: Diagnosis not present

## 2017-04-12 DIAGNOSIS — C61 Malignant neoplasm of prostate: Secondary | ICD-10-CM | POA: Diagnosis not present

## 2017-04-12 DIAGNOSIS — M1612 Unilateral primary osteoarthritis, left hip: Secondary | ICD-10-CM | POA: Diagnosis not present

## 2017-06-10 DIAGNOSIS — N472 Paraphimosis: Secondary | ICD-10-CM | POA: Diagnosis not present

## 2017-06-10 DIAGNOSIS — C61 Malignant neoplasm of prostate: Secondary | ICD-10-CM | POA: Diagnosis not present

## 2017-07-08 DIAGNOSIS — C61 Malignant neoplasm of prostate: Secondary | ICD-10-CM | POA: Diagnosis not present

## 2017-08-23 DIAGNOSIS — Z6824 Body mass index (BMI) 24.0-24.9, adult: Secondary | ICD-10-CM | POA: Diagnosis not present

## 2017-08-23 DIAGNOSIS — F410 Panic disorder [episodic paroxysmal anxiety] without agoraphobia: Secondary | ICD-10-CM | POA: Diagnosis not present

## 2017-08-23 DIAGNOSIS — F418 Other specified anxiety disorders: Secondary | ICD-10-CM | POA: Diagnosis not present

## 2017-08-30 DIAGNOSIS — G20 Parkinson's disease: Secondary | ICD-10-CM | POA: Diagnosis not present

## 2017-08-30 DIAGNOSIS — M1612 Unilateral primary osteoarthritis, left hip: Secondary | ICD-10-CM | POA: Diagnosis not present

## 2017-08-30 DIAGNOSIS — F410 Panic disorder [episodic paroxysmal anxiety] without agoraphobia: Secondary | ICD-10-CM | POA: Diagnosis not present

## 2017-08-30 DIAGNOSIS — F418 Other specified anxiety disorders: Secondary | ICD-10-CM | POA: Diagnosis not present

## 2017-08-30 DIAGNOSIS — N400 Enlarged prostate without lower urinary tract symptoms: Secondary | ICD-10-CM | POA: Diagnosis not present

## 2017-08-30 DIAGNOSIS — R2689 Other abnormalities of gait and mobility: Secondary | ICD-10-CM | POA: Diagnosis not present

## 2017-08-30 DIAGNOSIS — R251 Tremor, unspecified: Secondary | ICD-10-CM | POA: Diagnosis not present

## 2017-08-30 DIAGNOSIS — I480 Paroxysmal atrial fibrillation: Secondary | ICD-10-CM | POA: Diagnosis not present

## 2017-08-30 DIAGNOSIS — R000 Tachycardia, unspecified: Secondary | ICD-10-CM | POA: Diagnosis not present

## 2017-08-30 DIAGNOSIS — Z6824 Body mass index (BMI) 24.0-24.9, adult: Secondary | ICD-10-CM | POA: Diagnosis not present

## 2017-08-30 DIAGNOSIS — E038 Other specified hypothyroidism: Secondary | ICD-10-CM | POA: Diagnosis not present

## 2017-08-30 DIAGNOSIS — R7301 Impaired fasting glucose: Secondary | ICD-10-CM | POA: Diagnosis not present

## 2017-09-03 DIAGNOSIS — F410 Panic disorder [episodic paroxysmal anxiety] without agoraphobia: Secondary | ICD-10-CM | POA: Diagnosis not present

## 2017-09-28 DIAGNOSIS — I480 Paroxysmal atrial fibrillation: Secondary | ICD-10-CM | POA: Diagnosis not present

## 2017-09-28 DIAGNOSIS — E039 Hypothyroidism, unspecified: Secondary | ICD-10-CM | POA: Diagnosis not present

## 2017-10-12 ENCOUNTER — Ambulatory Visit: Payer: Medicare Other | Admitting: Physical Therapy

## 2017-10-13 DIAGNOSIS — C61 Malignant neoplasm of prostate: Secondary | ICD-10-CM | POA: Diagnosis not present

## 2017-10-21 DIAGNOSIS — C61 Malignant neoplasm of prostate: Secondary | ICD-10-CM | POA: Diagnosis not present

## 2017-10-25 DIAGNOSIS — C61 Malignant neoplasm of prostate: Secondary | ICD-10-CM | POA: Diagnosis not present

## 2017-10-25 DIAGNOSIS — N471 Phimosis: Secondary | ICD-10-CM | POA: Diagnosis not present

## 2017-10-25 DIAGNOSIS — N476 Balanoposthitis: Secondary | ICD-10-CM | POA: Diagnosis not present

## 2017-10-25 DIAGNOSIS — I10 Essential (primary) hypertension: Secondary | ICD-10-CM | POA: Diagnosis not present

## 2017-10-25 DIAGNOSIS — Z01818 Encounter for other preprocedural examination: Secondary | ICD-10-CM | POA: Diagnosis not present

## 2017-10-25 DIAGNOSIS — R9431 Abnormal electrocardiogram [ECG] [EKG]: Secondary | ICD-10-CM | POA: Diagnosis not present

## 2017-10-27 DIAGNOSIS — N471 Phimosis: Secondary | ICD-10-CM | POA: Diagnosis not present

## 2017-10-27 DIAGNOSIS — N476 Balanoposthitis: Secondary | ICD-10-CM | POA: Diagnosis not present

## 2017-10-28 DIAGNOSIS — N476 Balanoposthitis: Secondary | ICD-10-CM | POA: Diagnosis not present

## 2018-03-15 DIAGNOSIS — C61 Malignant neoplasm of prostate: Secondary | ICD-10-CM | POA: Diagnosis not present

## 2018-04-13 DIAGNOSIS — E7849 Other hyperlipidemia: Secondary | ICD-10-CM | POA: Diagnosis not present

## 2018-04-13 DIAGNOSIS — R7309 Other abnormal glucose: Secondary | ICD-10-CM | POA: Diagnosis not present

## 2018-04-13 DIAGNOSIS — Z125 Encounter for screening for malignant neoplasm of prostate: Secondary | ICD-10-CM | POA: Diagnosis not present

## 2018-04-13 DIAGNOSIS — E038 Other specified hypothyroidism: Secondary | ICD-10-CM | POA: Diagnosis not present

## 2018-04-18 DIAGNOSIS — N400 Enlarged prostate without lower urinary tract symptoms: Secondary | ICD-10-CM | POA: Diagnosis not present

## 2018-04-18 DIAGNOSIS — G20 Parkinson's disease: Secondary | ICD-10-CM | POA: Diagnosis not present

## 2018-04-18 DIAGNOSIS — R52 Pain, unspecified: Secondary | ICD-10-CM | POA: Diagnosis not present

## 2018-04-18 DIAGNOSIS — Z0000 Encounter for general adult medical examination without abnormal findings: Secondary | ICD-10-CM | POA: Diagnosis not present

## 2018-04-18 DIAGNOSIS — E7849 Other hyperlipidemia: Secondary | ICD-10-CM | POA: Diagnosis not present

## 2018-04-18 DIAGNOSIS — M1612 Unilateral primary osteoarthritis, left hip: Secondary | ICD-10-CM | POA: Diagnosis not present

## 2018-04-18 DIAGNOSIS — E038 Other specified hypothyroidism: Secondary | ICD-10-CM | POA: Diagnosis not present

## 2018-04-18 DIAGNOSIS — R82998 Other abnormal findings in urine: Secondary | ICD-10-CM | POA: Diagnosis not present

## 2018-04-18 DIAGNOSIS — Z1389 Encounter for screening for other disorder: Secondary | ICD-10-CM | POA: Diagnosis not present

## 2018-04-18 DIAGNOSIS — R7301 Impaired fasting glucose: Secondary | ICD-10-CM | POA: Diagnosis not present

## 2018-04-18 DIAGNOSIS — Z23 Encounter for immunization: Secondary | ICD-10-CM | POA: Diagnosis not present

## 2018-04-18 DIAGNOSIS — R000 Tachycardia, unspecified: Secondary | ICD-10-CM | POA: Diagnosis not present

## 2018-04-18 DIAGNOSIS — I480 Paroxysmal atrial fibrillation: Secondary | ICD-10-CM | POA: Diagnosis not present

## 2018-04-21 DIAGNOSIS — H6123 Impacted cerumen, bilateral: Secondary | ICD-10-CM | POA: Diagnosis not present

## 2018-06-08 DIAGNOSIS — R05 Cough: Secondary | ICD-10-CM | POA: Diagnosis not present

## 2018-06-08 DIAGNOSIS — Z6824 Body mass index (BMI) 24.0-24.9, adult: Secondary | ICD-10-CM | POA: Diagnosis not present

## 2018-06-08 DIAGNOSIS — J069 Acute upper respiratory infection, unspecified: Secondary | ICD-10-CM | POA: Diagnosis not present

## 2018-07-27 ENCOUNTER — Encounter: Payer: Self-pay | Admitting: Neurology

## 2018-08-05 NOTE — Progress Notes (Addendum)
Nathan Stanley was seen today in the movement disorders clinic for neurologic consultation at the request of Burnard Bunting, MD.  The consultation is for the evaluation of "parkinsonism."  This patient is accompanied in the office by his child who supplements the history.Patient has previously seen Dr. Leta Baptist for this, but no records are available regarding these visits, as they were prior to [facility] neurology being on electronic medical records.  Pt is on carbidopa/levodopa 25/100, 1 po tid (8am/noon/5pm) and has been on this for many years.  He states that he was never given a diagnosis of PD but he was put on carbidopa/levodopa 25/100 years ago.    Marland Kitchen   Specific Symptoms:  Tremor: Yes.  , L hand only, both at rest and with activation Family hx of similar:  No. Voice: weaker Sleep: sleeps well  Vivid Dreams:  Yes.  , but not sure if more real than in the past  Acting out dreams:  No. Wet Pillows: No. Postural symptoms:  Yes.    Falls?  Yes.  , last fall was a week ago.  Last fall with fx was 2 years ago - fx hip and wrist at same time Bradykinesia symptoms: shuffling gait, slow movements, difficulty getting out of a chair and difficulty regaining balance Loss of smell:  Yes.   Loss of taste:  No. Urinary Incontinence:  Yes.  , just during the night and doesn't need to wear something - just a little "leak" Difficulty Swallowing:  Yes.  , usually if "laying down in the recliner and drinking something." Handwriting, micrographia: unknown Trouble with ADL's:  Yes.    Trouble buttoning clothing: Yes.   Depression:  Yes.  , occasionally Memory changes:  Yes.   , short term (lives with wife and they have a caregiver 24 hours per day) Hallucinations:  No.  visual distortions: No. N/V:  No. Lightheaded:  Yes.  , intermittent if gets up to fast  Syncope: No. Diplopia:  No. Dyskinesia:  No. Prior exposure to reglan/antipsychotics: No.  Neuroimaging of the brain has not previously  been performed.    PREVIOUS MEDICATIONS: Sinemet  ALLERGIES:  No Known Allergies  CURRENT MEDICATIONS:  Outpatient Encounter Medications as of 08/09/2018  Medication Sig  . ALPRAZolam (XANAX) 0.25 MG tablet Take 0.25 mg by mouth as needed.    Marland Kitchen aspirin EC 81 MG tablet Take 1 tablet (81 mg total) by mouth daily.  . carbidopa-levodopa (SINEMET IR) 25-100 MG per tablet Take 1 tablet by mouth 3 (three) times daily.  Marland Kitchen levothyroxine (LEVOTHROID) 25 MCG tablet Take 25 mcg by mouth daily.  . [DISCONTINUED] meloxicam (MOBIC) 15 MG tablet Take 15 mg by mouth daily.  . [DISCONTINUED] metoprolol tartrate (LOPRESSOR) 25 MG tablet TAKE ONE TABLET BY MOUTH EVERY DAY AS NEEDED FOR  PALPITATIONS   No facility-administered encounter medications on file as of 08/09/2018.     PAST MEDICAL HISTORY:   Past Medical History:  Diagnosis Date  . Adenomatous colon polyp   . Anxiety   . BPH (benign prostatic hyperplasia)   . Depression   . Diverticulosis   . Hypercholesterolemia   . Hyperlipidemia   . Osteoarthritis   . Osteoporosis   . PAF (paroxysmal atrial fibrillation) ([place])   . Parkinsonism ([place])   . Prostate cancer ([place])   . Tremor     PAST SURGICAL HISTORY:   Past Surgical History:  Procedure Laterality Date  . AMPUTATION FINGER / THUMB  1948  .  ATRIAL FIBRILLATION ABLATION    . CARDIAC CATHETERIZATION  2005  . CLAVICLE SURGERY Left 1950  . HERNIA REPAIR Right 1992  . TOTAL KNEE ARTHROPLASTY  90's   left  . TOTAL KNEE ARTHROPLASTY  90's   rt    SOCIAL HISTORY:   Social History   Socioeconomic History  . Marital status: Married    Spouse name: Not on file  . Number of children: 2  . Years of education: Not on file  . Highest education level: Not on file  Occupational History  . 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 Financial resource strain: Not on file  . Food insecurity:    Worry: Not on file    Inability: Not on file  . Transportation  needs:    Medical: Not on file    Non-medical: Not on file  Tobacco Use  . Smoking status: Never Smoker  . Smokeless tobacco: Never Used  Substance and Sexual Activity  . Alcohol use: No  . Drug use: No  . Sexual activity: Not on file  Lifestyle  . Physical activity:    Days per week: Not on file    Minutes per session: Not on file  . Stress: Not on file  Relationships  . Social connections:    Talks on phone: Not on file    Gets together: Not on file    Attends religious service: Not on file    Active member of club or organization: Not on file    Attends meetings of clubs or organizations: Not on file    Relationship status: Not on file  . Intimate partner violence:    Fear of current or ex partner: Not on file    Emotionally abused: Not on file    Physically abused: Not on file    Forced sexual activity: Not on file  Other Topics Concern  . Not on file  Social History Narrative   1-2 caffeine drinks daily     FAMILY HISTORY:   Family Status  Relation Name Status  . Brother  Deceased  . Mother  Deceased  . Father  Deceased  . Son  Alive  . Daughter  Alive  . Neg Hx  (Not Specified)    ROS:  Review of Systems  Constitutional: Negative.   HENT: Negative.   Eyes: Negative.   Respiratory: Positive for shortness of breath (DOE).   Cardiovascular: Negative.   Gastrointestinal: Negative.   Genitourinary: Positive for frequency.  Musculoskeletal: Negative.   Skin: Negative.   Endo/Heme/Allergies: Negative.   Psychiatric/Behavioral: Positive for memory loss.    PHYSICAL EXAMINATION:    VITALS:   Vitals:   08/09/18 0837  BP: 132/66  Pulse: 90  SpO2: 92%    GEN:  The patient appears stated age and is in NAD. HEENT:  Normocephalic, atraumatic.  The mucous membranes are moist. The superficial temporal arteries are without ropiness or tenderness. CV:  RRR Lungs:  CTAB Neck/HEME:  There are no carotid bruits bilaterally.  Neurological  examination:  Orientation: The patient is alert and oriented x3. Fund of knowledge is appropriate.  Recent and remote memory are intact.  Attention and concentration are normal.    Able to name objects and repeat phrases. Cranial nerves: There is good facial symmetry. Pupils are surgical and nonreactive.  Funduscopic exam is attempted, but disc margins are not well visualized bilaterally.   Extraocular muscles are intact. The visual fields are  full to confrontational testing. The speech is fluent and clear.  He is hypophonic.  Soft palate rises symmetrically and there is no tongue deviation. Hearing is intact to conversational tone. Sensation: Sensation is intact to light and pinprick throughout (facial, trunk, extremities). Vibration is absent at the bilateral big toe but intact at the bilateral ankle. There is no extinction with double simultaneous stimulation. There is no sensory dermatomal level identified. Motor: Strength is 5/5 in the bilateral upper and lower extremities with the exception of the right hand, which is disfigured due to prior trauma and he has no grip and is missing fingers.   Shoulder shrug is equal and symmetric.  There is no pronator drift. Deep tendon reflexes: Deep tendon reflexes are 0/4 at the bilateral biceps, triceps, brachioradialis, patella and achilles. Plantar responses are downgoing bilaterally.  Movement examination: Tone: There is mild increased tone in the LUE and mild to mod in the LLE.  There is normal tone in the RUE/RLE. Abnormal movements: there is near constant LUE resting tremor Coordination:  There is mild decremation with RAM's, with any form of RAMS, including alternating supination and pronation of the forearm, hand opening and closing, finger taps, heel taps and toe taps on the L, overall mild Gait and Station: The patient has  difficulty arising out of a deep-seated chair without the use of the hands and even with the hands, he makes 3 attempts and  requires some assist. The patient's stride length is just slightly decreased and he is slightly unstable  Lab work was dated April 13, 2018.  Sodium was 143, potassium 4.0, chloride 104, CO2 29, BUN 19, creatinine 0.9.  White blood cells were 8.4, hemoglobin 14.5, hematocrit 46.8 and platelets 159.  TSH was 2.45.  Free T4 was 1.1.  Hemoglobin A1c was 5.5.  ASSESSMENT/PLAN:  1. idiopathic Parkinson's disease.  The patient has tremor, bradykinesia, rigidity and postural instability.  -We discussed the diagnosis as well as pathophysiology of the disease.  We discussed treatment options as well as prognostic indicators.  Patient education was provided.  -We discussed that it used to be thought that levodopa would increase risk of melanoma but now it is believed that Parkinsons itself likely increases risk of melanoma. he is to get regular skin checks.  -Greater than 50% of the 60 minute visit was spent in counseling answering questions and talking about what to expect now as well as in the future.  We talked about medication options as well as potential future surgical options.  We talked about safety in the home.  -We decided to increase carbidopa/levodopa 25/100, to 1 tablet at 7 AM/11 AM/3 PM/7 PM.  -Patient is having some trouble sleeping at night because of tremor.  He will often take an extra immediate release tablet (he does think it works for tremor).  Instead, I will have him take carbidopa/levodopa 25/100 CR at bedtime.  We may need to increase that dose.  -I will send home physical therapy to the house.  -We discussed community resources in the area including patient support groups and community exercise programs for PD and pt education was provided to the patient.  -Patient does have 24-hour caregiving in the home.  -I will continue to try to get a copy of old records from Dr. Leta Baptist.  We have called, but have not received a copy.  2.  Follow up is anticipated in the next 4 months,  sooner should new neurologic issues arise.  Addendum: Following the visit  today, I did receive records from [facility] neurology.  This was a single visit dated April 15, 2011.  Patient was seen for left upper extremity resting tremor that has been present since June, 2010.  Patient had reported that it started after radiation seed implants in June, 2010.  Dr. Leta Baptist felt the patient had "parkinsonism" and had a large differential diagnoses including "Parkinson's disease, vascular parkinsonism, metabolic, CNS structural/inflammatory lesion."  He was started on levodopa and alprazolam at that visit.   Cc:  Burnard Bunting, MD

## 2018-08-09 ENCOUNTER — Ambulatory Visit (INDEPENDENT_AMBULATORY_CARE_PROVIDER_SITE_OTHER): Payer: Medicare Other | Admitting: Neurology

## 2018-08-09 ENCOUNTER — Encounter: Payer: Self-pay | Admitting: Neurology

## 2018-08-09 VITALS — BP 132/66 | HR 90

## 2018-08-09 DIAGNOSIS — G20 Parkinson's disease: Secondary | ICD-10-CM

## 2018-08-09 MED ORDER — CARBIDOPA-LEVODOPA 25-100 MG PO TABS: 1.0000 | ORAL_TABLET | Freq: Four times a day (QID) | ORAL | 1 refills | Status: DC

## 2018-08-09 MED ORDER — CARBIDOPA-LEVODOPA ER 25-100 MG PO TBCR: 1.0000 | EXTENDED_RELEASE_TABLET | Freq: Every day | ORAL | 1 refills | Status: DC

## 2018-08-09 NOTE — Patient Instructions (Signed)
1. Increase Carbidopa Levodopa 25/100 IR - 1 tablet at 7am, 11am, 3pm, and 7pm.   2. Add Carbidopa Levodopa 25/100 CR - 1 tablet at bedtime.

## 2018-09-08 DIAGNOSIS — C61 Malignant neoplasm of prostate: Secondary | ICD-10-CM | POA: Diagnosis not present

## 2018-10-24 DIAGNOSIS — R531 Weakness: Secondary | ICD-10-CM | POA: Diagnosis not present

## 2018-10-24 DIAGNOSIS — R7301 Impaired fasting glucose: Secondary | ICD-10-CM | POA: Diagnosis not present

## 2018-10-24 DIAGNOSIS — R52 Pain, unspecified: Secondary | ICD-10-CM | POA: Diagnosis not present

## 2018-10-24 DIAGNOSIS — I480 Paroxysmal atrial fibrillation: Secondary | ICD-10-CM | POA: Diagnosis not present

## 2018-10-24 DIAGNOSIS — E785 Hyperlipidemia, unspecified: Secondary | ICD-10-CM | POA: Diagnosis not present

## 2018-10-24 DIAGNOSIS — E039 Hypothyroidism, unspecified: Secondary | ICD-10-CM | POA: Diagnosis not present

## 2018-10-24 DIAGNOSIS — C61 Malignant neoplasm of prostate: Secondary | ICD-10-CM | POA: Diagnosis not present

## 2018-10-24 DIAGNOSIS — M1990 Unspecified osteoarthritis, unspecified site: Secondary | ICD-10-CM | POA: Diagnosis not present

## 2018-10-24 DIAGNOSIS — M1612 Unilateral primary osteoarthritis, left hip: Secondary | ICD-10-CM | POA: Diagnosis not present

## 2018-10-24 DIAGNOSIS — G20 Parkinson's disease: Secondary | ICD-10-CM | POA: Diagnosis not present

## 2018-10-24 DIAGNOSIS — R000 Tachycardia, unspecified: Secondary | ICD-10-CM | POA: Diagnosis not present

## 2018-10-24 DIAGNOSIS — N400 Enlarged prostate without lower urinary tract symptoms: Secondary | ICD-10-CM | POA: Diagnosis not present

## 2018-11-28 DIAGNOSIS — R2689 Other abnormalities of gait and mobility: Secondary | ICD-10-CM | POA: Diagnosis not present

## 2018-11-28 DIAGNOSIS — M545 Low back pain: Secondary | ICD-10-CM | POA: Diagnosis not present

## 2018-11-28 DIAGNOSIS — I480 Paroxysmal atrial fibrillation: Secondary | ICD-10-CM | POA: Diagnosis not present

## 2018-11-28 DIAGNOSIS — G20 Parkinson's disease: Secondary | ICD-10-CM | POA: Diagnosis not present

## 2018-11-30 ENCOUNTER — Other Ambulatory Visit: Payer: Self-pay | Admitting: Internal Medicine

## 2018-11-30 DIAGNOSIS — M545 Low back pain, unspecified: Secondary | ICD-10-CM

## 2018-12-09 ENCOUNTER — Ambulatory Visit: Payer: Medicare Other | Admitting: Neurology

## 2018-12-12 ENCOUNTER — Ambulatory Visit
Admission: RE | Admit: 2018-12-12 | Discharge: 2018-12-12 | Disposition: A | Discharge status: Home / Self Care | Payer: Medicare Other | Source: Ambulatory Visit | Attending: Internal Medicine | Admitting: Internal Medicine

## 2018-12-12 ENCOUNTER — Other Ambulatory Visit: Payer: Self-pay

## 2018-12-12 DIAGNOSIS — M545 Low back pain, unspecified: Secondary | ICD-10-CM

## 2018-12-12 NOTE — Progress Notes (Signed)
Virtual Visit via Video Note The purpose of this virtual visit is to provide medical care while limiting exposure to the novel coronavirus.    Consent was obtained for video visit:  Yes.   Answered questions that patient had about telehealth interaction:  Yes.   I discussed the limitations, risks, security and privacy concerns of performing an evaluation and management service by telemedicine. I also discussed with the patient that there may be a patient responsible charge related to this service. The patient expressed understanding and agreed to proceed.  Pt location: Home Physician Location: home Name of referring provider:  Burnard Bunting, MD I connected with Nathan Stanley at patients initiation/request on 12/14/2018 at  9:45 AM EDT by video enabled telemedicine application and verified that I am speaking with the correct person using two identifiers. [name]:  [id number] [name]:  [birth year] Video Participants:  Nathan Stanley;  Caregiver supplements hx   History of Present Illness:  Patient seen today in follow-up for Parkinson's disease.  Last visit, I increased the patient's carbidopa/levodopa 25/100 to 1 tablet 4 times per day.  I also changed his bedtime dose of carbidopa/levodopa to 25/100 CR at bedtime.  The nighttime tremor is no longer awakening him from sleep.  The patient reports that he has better and worse times.  He feels that med is somewhat inconsistent, depending on what he eats, but he does try to get it 15-30 min before eating.  He admits to lack of exercise.  He has not had falls since our last visit.  There is caregiver present 24 hours per day.  No hallucinations.     Current Outpatient Medications on File Prior to Visit  Medication Sig Dispense Refill  . ALPRAZolam (XANAX) 0.25 MG tablet Take 0.25 mg by mouth as needed.      Marland Kitchen aspirin EC 81 MG tablet Take 1 tablet (81 mg total) by mouth daily.    . carbidopa-levodopa (SINEMET IR) 25-100 MG tablet Take 1 tablet by  mouth 4 (four) times daily. 360 tablet 1  . Carbidopa-Levodopa ER (SINEMET CR) 25-100 MG tablet controlled release Take 1 tablet by mouth at bedtime. 90 tablet 1  . levothyroxine (LEVOTHROID) 25 MCG tablet Take 25 mcg by mouth daily.     No current facility-administered medications on file prior to visit.      Observations/Objective:   Vitals:   12/14/18 0734  Weight: 165 lb (74.8 kg)  Height: 5\' 10"  (1.778 m)   GEN:  The patient appears stated age and is in NAD.  Neurological examination:  Orientation: The patient is alert and oriented x3. Cranial nerves: There is good facial symmetry. There is mild facial hypomimia.  The speech is fluent and clear. Soft palate rises symmetrically and there is no tongue deviation. Hearing is intact to conversational tone. Motor: Strength is at least antigravity x 4.   Shoulder shrug is equal and symmetric.  There is no pronator drift.  Movement examination: Tone: unable Abnormal movements: there is LUE rest tremor Coordination:  There is decremation with RAM's, with finger taps on the right Gait and Station: The patient pushes out of his deep-seated chair to arise.  He is stooped at the waist.  He has purposeful arm swing bilaterally.    Assessment and Plan:   1.  Parkinson's disease  -Increase carbidopa/levodopa 25/100, 1.5 tablets 4 times per day.  -Continue carbidopa/levodopa 25/100 CR at bedtime for nighttime tremor  -Patient has 24-hour caregiving in the home.  -  states that PT didn't call him last visit.  Will re-order via advanced  -Safe exercise was encouraged.  Follow Up Instructions:  5-6 months.  Patient wishes to try to keep this an evisit, as leaving the home is difficult  -I discussed the assessment and treatment plan with the patient. The patient was provided an opportunity to ask questions and all were answered. The patient agreed with the plan and demonstrated an understanding of the instructions.   The patient was advised  to call back or seek an in-person evaluation if the symptoms worsen or if the condition fails to improve as anticipated.    Total Time spent in visit with the patient was:  15 min, of which more than 50% of the time was spent in counseling and/or coordinating care on safety.   Pt understands and agrees with the plan of care outlined.     Alonza Bogus, DO

## 2018-12-13 ENCOUNTER — Other Ambulatory Visit: Payer: Medicare Other

## 2018-12-14 ENCOUNTER — Encounter: Payer: Self-pay | Admitting: Neurology

## 2018-12-14 ENCOUNTER — Other Ambulatory Visit: Payer: Self-pay

## 2018-12-14 ENCOUNTER — Telehealth (INDEPENDENT_AMBULATORY_CARE_PROVIDER_SITE_OTHER): Payer: Medicare Other | Admitting: Neurology

## 2018-12-14 DIAGNOSIS — G20 Parkinson's disease: Secondary | ICD-10-CM

## 2018-12-14 MED ORDER — CARBIDOPA-LEVODOPA 25-100 MG PO TABS: 1.5000 | ORAL_TABLET | Freq: Four times a day (QID) | ORAL | 1 refills | Status: DC

## 2018-12-14 NOTE — Addendum Note (Signed)
Addended by: Ranae Plumber on: 12/14/2018 10:01 AM   Modules accepted: Orders

## 2018-12-16 DIAGNOSIS — I480 Paroxysmal atrial fibrillation: Secondary | ICD-10-CM | POA: Diagnosis not present

## 2018-12-16 DIAGNOSIS — Z7982 Long term (current) use of aspirin: Secondary | ICD-10-CM | POA: Diagnosis not present

## 2018-12-16 DIAGNOSIS — G20 Parkinson's disease: Secondary | ICD-10-CM | POA: Diagnosis not present

## 2018-12-16 DIAGNOSIS — M810 Age-related osteoporosis without current pathological fracture: Secondary | ICD-10-CM | POA: Diagnosis not present

## 2018-12-19 ENCOUNTER — Telehealth: Payer: Self-pay

## 2018-12-19 NOTE — Telephone Encounter (Signed)
Received voice mail from [place], [place] requesting verbal orders to continue home health PT for balance training , strengthen training  1 x 1 week 2 x 1 week 1 x 7 weeks  Called Chris verbal order given left on voice mail

## 2018-12-22 ENCOUNTER — Ambulatory Visit: Payer: Medicare Other | Admitting: Neurology

## 2018-12-22 DIAGNOSIS — M810 Age-related osteoporosis without current pathological fracture: Secondary | ICD-10-CM | POA: Diagnosis not present

## 2018-12-22 DIAGNOSIS — I480 Paroxysmal atrial fibrillation: Secondary | ICD-10-CM | POA: Diagnosis not present

## 2018-12-22 DIAGNOSIS — Z7982 Long term (current) use of aspirin: Secondary | ICD-10-CM | POA: Diagnosis not present

## 2018-12-22 DIAGNOSIS — G20 Parkinson's disease: Secondary | ICD-10-CM | POA: Diagnosis not present

## 2018-12-23 DIAGNOSIS — M810 Age-related osteoporosis without current pathological fracture: Secondary | ICD-10-CM | POA: Diagnosis not present

## 2018-12-23 DIAGNOSIS — G20 Parkinson's disease: Secondary | ICD-10-CM | POA: Diagnosis not present

## 2018-12-23 DIAGNOSIS — I480 Paroxysmal atrial fibrillation: Secondary | ICD-10-CM | POA: Diagnosis not present

## 2018-12-23 DIAGNOSIS — Z7982 Long term (current) use of aspirin: Secondary | ICD-10-CM | POA: Diagnosis not present

## 2018-12-27 DIAGNOSIS — M810 Age-related osteoporosis without current pathological fracture: Secondary | ICD-10-CM | POA: Diagnosis not present

## 2018-12-27 DIAGNOSIS — I480 Paroxysmal atrial fibrillation: Secondary | ICD-10-CM | POA: Diagnosis not present

## 2018-12-27 DIAGNOSIS — Z7982 Long term (current) use of aspirin: Secondary | ICD-10-CM | POA: Diagnosis not present

## 2018-12-27 DIAGNOSIS — G20 Parkinson's disease: Secondary | ICD-10-CM | POA: Diagnosis not present

## 2018-12-29 DIAGNOSIS — C61 Malignant neoplasm of prostate: Secondary | ICD-10-CM | POA: Diagnosis not present

## 2019-01-04 DIAGNOSIS — Z7982 Long term (current) use of aspirin: Secondary | ICD-10-CM | POA: Diagnosis not present

## 2019-01-04 DIAGNOSIS — I480 Paroxysmal atrial fibrillation: Secondary | ICD-10-CM | POA: Diagnosis not present

## 2019-01-04 DIAGNOSIS — G20 Parkinson's disease: Secondary | ICD-10-CM | POA: Diagnosis not present

## 2019-01-04 DIAGNOSIS — M810 Age-related osteoporosis without current pathological fracture: Secondary | ICD-10-CM | POA: Diagnosis not present

## 2019-01-11 DIAGNOSIS — G20 Parkinson's disease: Secondary | ICD-10-CM | POA: Diagnosis not present

## 2019-01-11 DIAGNOSIS — Z7982 Long term (current) use of aspirin: Secondary | ICD-10-CM | POA: Diagnosis not present

## 2019-01-11 DIAGNOSIS — M810 Age-related osteoporosis without current pathological fracture: Secondary | ICD-10-CM | POA: Diagnosis not present

## 2019-01-11 DIAGNOSIS — I480 Paroxysmal atrial fibrillation: Secondary | ICD-10-CM | POA: Diagnosis not present

## 2019-01-15 DIAGNOSIS — G20 Parkinson's disease: Secondary | ICD-10-CM | POA: Diagnosis not present

## 2019-01-15 DIAGNOSIS — M810 Age-related osteoporosis without current pathological fracture: Secondary | ICD-10-CM | POA: Diagnosis not present

## 2019-01-15 DIAGNOSIS — Z7982 Long term (current) use of aspirin: Secondary | ICD-10-CM | POA: Diagnosis not present

## 2019-01-15 DIAGNOSIS — I480 Paroxysmal atrial fibrillation: Secondary | ICD-10-CM | POA: Diagnosis not present

## 2019-01-19 DIAGNOSIS — M810 Age-related osteoporosis without current pathological fracture: Secondary | ICD-10-CM | POA: Diagnosis not present

## 2019-01-19 DIAGNOSIS — I480 Paroxysmal atrial fibrillation: Secondary | ICD-10-CM | POA: Diagnosis not present

## 2019-01-19 DIAGNOSIS — G20 Parkinson's disease: Secondary | ICD-10-CM | POA: Diagnosis not present

## 2019-01-19 DIAGNOSIS — Z7982 Long term (current) use of aspirin: Secondary | ICD-10-CM | POA: Diagnosis not present

## 2019-01-24 DIAGNOSIS — M810 Age-related osteoporosis without current pathological fracture: Secondary | ICD-10-CM | POA: Diagnosis not present

## 2019-01-24 DIAGNOSIS — I480 Paroxysmal atrial fibrillation: Secondary | ICD-10-CM | POA: Diagnosis not present

## 2019-01-24 DIAGNOSIS — G20 Parkinson's disease: Secondary | ICD-10-CM | POA: Diagnosis not present

## 2019-01-24 DIAGNOSIS — Z7982 Long term (current) use of aspirin: Secondary | ICD-10-CM | POA: Diagnosis not present

## 2019-01-25 DIAGNOSIS — I447 Left bundle-branch block, unspecified: Secondary | ICD-10-CM | POA: Diagnosis not present

## 2019-01-25 DIAGNOSIS — I480 Paroxysmal atrial fibrillation: Secondary | ICD-10-CM | POA: Diagnosis not present

## 2019-01-25 DIAGNOSIS — R9431 Abnormal electrocardiogram [ECG] [EKG]: Secondary | ICD-10-CM | POA: Diagnosis not present

## 2019-01-25 DIAGNOSIS — I471 Supraventricular tachycardia: Secondary | ICD-10-CM | POA: Diagnosis not present

## 2019-01-31 DIAGNOSIS — Z7982 Long term (current) use of aspirin: Secondary | ICD-10-CM | POA: Diagnosis not present

## 2019-01-31 DIAGNOSIS — G20 Parkinson's disease: Secondary | ICD-10-CM | POA: Diagnosis not present

## 2019-01-31 DIAGNOSIS — I480 Paroxysmal atrial fibrillation: Secondary | ICD-10-CM | POA: Diagnosis not present

## 2019-01-31 DIAGNOSIS — M810 Age-related osteoporosis without current pathological fracture: Secondary | ICD-10-CM | POA: Diagnosis not present

## 2019-02-07 ENCOUNTER — Other Ambulatory Visit: Payer: Self-pay | Admitting: Neurology

## 2019-02-07 NOTE — Telephone Encounter (Signed)
Requested Prescriptions   Pending Prescriptions Disp Refills  . Carbidopa-Levodopa ER (SINEMET CR) 25-100 MG tablet controlled release [Pharmacy Med Name: CARBIDOPA-LEVO ER 25-100 TAB] 90 tablet 0    Sig: TAKE 1 TABLET BY MOUTH AT BEDTIME   Rx last filled: 08/09/18 #90 1 refills  Pt last seen: 12/14/18   Follow up appt scheduled: 05/29/19  sent

## 2019-02-15 DIAGNOSIS — M545 Low back pain: Secondary | ICD-10-CM | POA: Diagnosis not present

## 2019-02-15 DIAGNOSIS — M48061 Spinal stenosis, lumbar region without neurogenic claudication: Secondary | ICD-10-CM | POA: Diagnosis not present

## 2019-02-15 DIAGNOSIS — M5416 Radiculopathy, lumbar region: Secondary | ICD-10-CM | POA: Diagnosis not present

## 2019-02-15 DIAGNOSIS — S22080A Wedge compression fracture of T11-T12 vertebra, initial encounter for closed fracture: Secondary | ICD-10-CM | POA: Diagnosis not present

## 2019-02-15 DIAGNOSIS — S32020A Wedge compression fracture of second lumbar vertebra, initial encounter for closed fracture: Secondary | ICD-10-CM | POA: Diagnosis not present

## 2019-02-15 DIAGNOSIS — M4126 Other idiopathic scoliosis, lumbar region: Secondary | ICD-10-CM | POA: Diagnosis not present

## 2019-02-15 DIAGNOSIS — R030 Elevated blood-pressure reading, without diagnosis of hypertension: Secondary | ICD-10-CM | POA: Diagnosis not present

## 2019-02-20 DIAGNOSIS — R002 Palpitations: Secondary | ICD-10-CM | POA: Diagnosis not present

## 2019-02-20 DIAGNOSIS — I4469 Other fascicular block: Secondary | ICD-10-CM | POA: Diagnosis not present

## 2019-02-20 DIAGNOSIS — I447 Left bundle-branch block, unspecified: Secondary | ICD-10-CM | POA: Diagnosis not present

## 2019-02-20 DIAGNOSIS — R918 Other nonspecific abnormal finding of lung field: Secondary | ICD-10-CM | POA: Diagnosis not present

## 2019-02-20 DIAGNOSIS — I4891 Unspecified atrial fibrillation: Secondary | ICD-10-CM | POA: Diagnosis not present

## 2019-02-20 DIAGNOSIS — R9431 Abnormal electrocardiogram [ECG] [EKG]: Secondary | ICD-10-CM | POA: Diagnosis not present

## 2019-02-20 DIAGNOSIS — R000 Tachycardia, unspecified: Secondary | ICD-10-CM | POA: Diagnosis not present

## 2019-02-20 DIAGNOSIS — I499 Cardiac arrhythmia, unspecified: Secondary | ICD-10-CM | POA: Diagnosis not present

## 2019-02-20 DIAGNOSIS — I10 Essential (primary) hypertension: Secondary | ICD-10-CM | POA: Diagnosis not present

## 2019-02-21 DIAGNOSIS — R002 Palpitations: Secondary | ICD-10-CM | POA: Diagnosis not present

## 2019-02-21 DIAGNOSIS — I4469 Other fascicular block: Secondary | ICD-10-CM | POA: Diagnosis not present

## 2019-02-27 DIAGNOSIS — I459 Conduction disorder, unspecified: Secondary | ICD-10-CM | POA: Diagnosis not present

## 2019-02-27 DIAGNOSIS — R002 Palpitations: Secondary | ICD-10-CM | POA: Diagnosis not present

## 2019-02-27 DIAGNOSIS — R9431 Abnormal electrocardiogram [ECG] [EKG]: Secondary | ICD-10-CM | POA: Diagnosis not present

## 2019-02-27 DIAGNOSIS — Z7982 Long term (current) use of aspirin: Secondary | ICD-10-CM | POA: Diagnosis not present

## 2019-02-27 DIAGNOSIS — I447 Left bundle-branch block, unspecified: Secondary | ICD-10-CM | POA: Diagnosis not present

## 2019-02-27 DIAGNOSIS — R0602 Shortness of breath: Secondary | ICD-10-CM | POA: Diagnosis not present

## 2019-02-27 DIAGNOSIS — Z66 Do not resuscitate: Secondary | ICD-10-CM | POA: Diagnosis present

## 2019-02-27 DIAGNOSIS — F329 Major depressive disorder, single episode, unspecified: Secondary | ICD-10-CM | POA: Diagnosis present

## 2019-02-27 DIAGNOSIS — M1990 Unspecified osteoarthritis, unspecified site: Secondary | ICD-10-CM | POA: Diagnosis present

## 2019-02-27 DIAGNOSIS — Z9181 History of falling: Secondary | ICD-10-CM | POA: Diagnosis not present

## 2019-02-27 DIAGNOSIS — G250 Essential tremor: Secondary | ICD-10-CM | POA: Diagnosis not present

## 2019-02-27 DIAGNOSIS — I4891 Unspecified atrial fibrillation: Secondary | ICD-10-CM | POA: Diagnosis not present

## 2019-02-27 DIAGNOSIS — I4581 Long QT syndrome: Secondary | ICD-10-CM | POA: Diagnosis not present

## 2019-02-27 DIAGNOSIS — I471 Supraventricular tachycardia: Secondary | ICD-10-CM | POA: Diagnosis not present

## 2019-02-27 DIAGNOSIS — R001 Bradycardia, unspecified: Secondary | ICD-10-CM | POA: Diagnosis not present

## 2019-02-27 DIAGNOSIS — E039 Hypothyroidism, unspecified: Secondary | ICD-10-CM | POA: Diagnosis not present

## 2019-02-27 DIAGNOSIS — E876 Hypokalemia: Secondary | ICD-10-CM | POA: Diagnosis not present

## 2019-02-27 DIAGNOSIS — F419 Anxiety disorder, unspecified: Secondary | ICD-10-CM | POA: Diagnosis not present

## 2019-02-27 DIAGNOSIS — Z79899 Other long term (current) drug therapy: Secondary | ICD-10-CM | POA: Diagnosis not present

## 2019-02-27 DIAGNOSIS — G20 Parkinson's disease: Secondary | ICD-10-CM | POA: Diagnosis not present

## 2019-02-27 DIAGNOSIS — R000 Tachycardia, unspecified: Secondary | ICD-10-CM | POA: Diagnosis not present

## 2019-02-27 DIAGNOSIS — I491 Atrial premature depolarization: Secondary | ICD-10-CM | POA: Diagnosis not present

## 2019-02-27 DIAGNOSIS — I480 Paroxysmal atrial fibrillation: Secondary | ICD-10-CM | POA: Diagnosis not present

## 2019-02-27 DIAGNOSIS — T447X5A Adverse effect of beta-adrenoreceptor antagonists, initial encounter: Secondary | ICD-10-CM | POA: Diagnosis not present

## 2019-03-30 DIAGNOSIS — I480 Paroxysmal atrial fibrillation: Secondary | ICD-10-CM | POA: Diagnosis not present

## 2019-04-04 DIAGNOSIS — C61 Malignant neoplasm of prostate: Secondary | ICD-10-CM | POA: Diagnosis not present

## 2019-04-19 DIAGNOSIS — E7849 Other hyperlipidemia: Secondary | ICD-10-CM | POA: Diagnosis not present

## 2019-04-19 DIAGNOSIS — E038 Other specified hypothyroidism: Secondary | ICD-10-CM | POA: Diagnosis not present

## 2019-04-19 DIAGNOSIS — R7301 Impaired fasting glucose: Secondary | ICD-10-CM | POA: Diagnosis not present

## 2019-05-04 ENCOUNTER — Other Ambulatory Visit: Payer: Self-pay | Admitting: Neurology

## 2019-05-04 DIAGNOSIS — R82998 Other abnormal findings in urine: Secondary | ICD-10-CM | POA: Diagnosis not present

## 2019-05-08 DIAGNOSIS — J069 Acute upper respiratory infection, unspecified: Secondary | ICD-10-CM | POA: Diagnosis not present

## 2019-05-08 DIAGNOSIS — R05 Cough: Secondary | ICD-10-CM | POA: Diagnosis not present

## 2019-05-08 DIAGNOSIS — R0609 Other forms of dyspnea: Secondary | ICD-10-CM | POA: Diagnosis not present

## 2019-05-08 DIAGNOSIS — Z1212 Encounter for screening for malignant neoplasm of rectum: Secondary | ICD-10-CM | POA: Diagnosis not present

## 2019-05-08 DIAGNOSIS — G20 Parkinson's disease: Secondary | ICD-10-CM | POA: Diagnosis not present

## 2019-05-08 DIAGNOSIS — Z20818 Contact with and (suspected) exposure to other bacterial communicable diseases: Secondary | ICD-10-CM | POA: Diagnosis not present

## 2019-05-17 DIAGNOSIS — R7301 Impaired fasting glucose: Secondary | ICD-10-CM | POA: Diagnosis not present

## 2019-05-17 DIAGNOSIS — Z0000 Encounter for general adult medical examination without abnormal findings: Secondary | ICD-10-CM | POA: Diagnosis not present

## 2019-05-17 DIAGNOSIS — M1990 Unspecified osteoarthritis, unspecified site: Secondary | ICD-10-CM | POA: Diagnosis not present

## 2019-05-17 DIAGNOSIS — R000 Tachycardia, unspecified: Secondary | ICD-10-CM | POA: Diagnosis not present

## 2019-05-17 DIAGNOSIS — N400 Enlarged prostate without lower urinary tract symptoms: Secondary | ICD-10-CM | POA: Diagnosis not present

## 2019-05-17 DIAGNOSIS — M545 Low back pain: Secondary | ICD-10-CM | POA: Diagnosis not present

## 2019-05-17 DIAGNOSIS — E785 Hyperlipidemia, unspecified: Secondary | ICD-10-CM | POA: Diagnosis not present

## 2019-05-17 DIAGNOSIS — M1612 Unilateral primary osteoarthritis, left hip: Secondary | ICD-10-CM | POA: Diagnosis not present

## 2019-05-17 DIAGNOSIS — E039 Hypothyroidism, unspecified: Secondary | ICD-10-CM | POA: Diagnosis not present

## 2019-05-17 DIAGNOSIS — G20 Parkinson's disease: Secondary | ICD-10-CM | POA: Diagnosis not present

## 2019-05-17 DIAGNOSIS — R52 Pain, unspecified: Secondary | ICD-10-CM | POA: Diagnosis not present

## 2019-05-17 DIAGNOSIS — I480 Paroxysmal atrial fibrillation: Secondary | ICD-10-CM | POA: Diagnosis not present

## 2019-05-23 NOTE — Progress Notes (Signed)
Virtual Visit via Phone Note The purpose of this virtual visit is to provide medical care while limiting exposure to the novel coronavirus.    Consent was obtained for telephone visit:  Yes.   Answered questions that patient had about telehealth interaction:  Yes.   I discussed the limitations, risks, security and privacy concerns of performing an evaluation and management service by telephone. I also discussed with the patient that there may be a patient responsible charge related to this service. The patient expressed understanding and agreed to proceed.  Pt location: Home Physician Location: office Name of referring provider:  Burnard Bunting, MD I connected with Algis Greenhouse at patients initiation/request on 05/29/2019 at 11:15 AM EST by telephone and verified that I am speaking with the correct person using two identifiers. [name]:  [id number] [name]:  1926/07/22 Video Participants:  Algis Greenhouse;  Caregiver supplements hx    History of Present Illness:  Patient seen today in follow-up for Parkinson's disease.  Last visit, I increased the patient's carbidopa/levodopa 25/100, so that he is taking 1.5 tablets 4 times per day and carbidopa/levodopa 25/100 CR at bedtime.  Tremor comes and goes but "I try to maintain a good attitude and that helps the most."  We also ordered home physical therapy.  He reports that he is not sure if they came out after that and caregiver doesn't think that they came.  They would like for someone to come out.  He does have 24 hour/day caregiving in the home.  He has had no falls since last visit.  No lightheadedness or near syncope.  No hallucinations.   Current Outpatient Medications on File Prior to Visit  Medication Sig Dispense Refill  . ALPRAZolam (XANAX) 0.25 MG tablet Take 0.25 mg by mouth as needed.      Marland Kitchen amiodarone (PACERONE) 200 MG tablet Take 200 mg by mouth daily.    Marland Kitchen aspirin EC 81 MG tablet Take 1 tablet (81 mg total) by mouth daily.    .  calcitonin, salmon, (MIACALCIN/FORTICAL) 200 UNIT/ACT nasal spray Place 1 spray into alternate nostrils daily.    . carbidopa-levodopa (SINEMET IR) 25-100 MG tablet Take 1.5 tablets by mouth 4 (four) times daily. 540 tablet 1  . Carbidopa-Levodopa ER (SINEMET CR) 25-100 MG tablet controlled release TAKE 1 TABLET BY MOUTH AT BEDTIME 90 tablet 0  . diltiazem (DILACOR XR) 180 MG 24 hr capsule Take 180 mg by mouth daily.    Marland Kitchen escitalopram (LEXAPRO) 10 MG tablet Take 10 mg by mouth daily.    Marland Kitchen guaiFENesin (MUCINEX) 600 MG 12 hr tablet Take 600 mg by mouth 2 (two) times daily as needed.    Marland Kitchen levothyroxine (LEVOTHROID) 25 MCG tablet Take 25 mcg by mouth daily.     No current facility-administered medications on file prior to visit.      Observations/Objective:   Vitals:   05/29/19 0938  Weight: 170 lb (77.1 kg)   GEN:  The patient appears stated age and is in NAD.  Neurological examination:  Orientation: The patient is alert and oriented x3. Speech is fluent and clear    Assessment and Plan:   1.  Parkinson's disease  -continue carbidopa/levodopa 25/100, 1.5 tablets 4 times per day.  -Continue carbidopa/levodopa 25/100 CR at bedtime for nighttime tremor  -Patient has 24-hour caregiving in the home.  -Safe exercise was encouraged.  -order PT to home.  Pt home bound  Follow Up Instructions: 6  months.  Patient  wishes to try to keep this an evisit, as leaving the home is difficult  -I discussed the assessment and treatment plan with the patient. The patient was provided an opportunity to ask questions and all were answered. The patient agreed with the plan and demonstrated an understanding of the instructions.   The patient was advised to call back or seek an in-person evaluation if the symptoms worsen or if the condition fails to improve as anticipated.    Total Time spent in visit with the patient was:  10 min, of which more than 50% of the time was spent in counseling and/or  coordinating care on safety.   Pt understands and agrees with the plan of care outlined.     Alonza Bogus, DO

## 2019-05-29 ENCOUNTER — Encounter: Payer: Self-pay | Admitting: Neurology

## 2019-05-29 ENCOUNTER — Telehealth (INDEPENDENT_AMBULATORY_CARE_PROVIDER_SITE_OTHER): Payer: Medicare Other | Admitting: Neurology

## 2019-05-29 ENCOUNTER — Telehealth: Payer: Self-pay

## 2019-05-29 ENCOUNTER — Other Ambulatory Visit: Payer: Self-pay

## 2019-05-29 VITALS — Wt 170.0 lb

## 2019-05-29 DIAGNOSIS — G20 Parkinson's disease: Secondary | ICD-10-CM

## 2019-05-29 NOTE — Telephone Encounter (Signed)
-----   Message from Lequire, DO sent at 05/29/2019 11:36 AM EST ----- Please send referral to [facility] for PT for PD

## 2019-05-30 ENCOUNTER — Telehealth: Payer: Self-pay

## 2019-05-30 DIAGNOSIS — G20 Parkinson's disease: Secondary | ICD-10-CM | POA: Diagnosis not present

## 2019-05-30 NOTE — Telephone Encounter (Signed)
Please place 06/16/2019 visit with Dr Tat, this is for his home health referral to be covered by insurance and must be a video visit.

## 2019-05-30 NOTE — Telephone Encounter (Signed)
Patient was not able to do 06-16-19 due to needing help do the Virtual visit. So we got her on at 3:30 on 06-13-19

## 2019-06-02 DIAGNOSIS — G20 Parkinson's disease: Secondary | ICD-10-CM | POA: Diagnosis not present

## 2019-06-06 DIAGNOSIS — G20 Parkinson's disease: Secondary | ICD-10-CM | POA: Diagnosis not present

## 2019-06-08 DIAGNOSIS — G20 Parkinson's disease: Secondary | ICD-10-CM | POA: Diagnosis not present

## 2019-06-12 DIAGNOSIS — G20 Parkinson's disease: Secondary | ICD-10-CM | POA: Diagnosis not present

## 2019-06-12 NOTE — Progress Notes (Signed)
Virtual Visit via Phone Note The purpose of this virtual visit is to provide medical care while limiting exposure to the novel coronavirus.    Consent was obtained for telephone visit:  Yes.   Answered questions that patient had about telehealth interaction:  Yes.   I discussed the limitations, risks, security and privacy concerns of performing an evaluation and management service by telephone. I also discussed with the patient that there may be a patient responsible charge related to this service. The patient expressed understanding and agreed to proceed.  Pt location: Home Physician Location: office Name of referring provider:  Burnard Bunting, MD I connected with Nathan Stanley at patients initiation/request on 06/13/2019 at  3:30 PM EST by telephone and verified that I am speaking with the correct person using two identifiers. [name]:  [id number] [name]:  March 17, 1927 Video Participants:  Nathan Stanley;  Caregiver supplements hx    History of Present Illness:  Patient seen today in follow-up for Parkinson's disease.  Patient is currently on carbidopa/levodopa 25/100, 1.5 tablets 4 times per day and carbidopa/levodopa 25/100 CR at bedtime.  I spoke with him a few days ago and did a referral for physical therapy, but they actually needed a video visit, which was part of the reason for today's visit.  In addition, when the therapist got to the home, they made some comments that pt was having trouble swallowing but caregiver says that it was his stool softener.  Patient does state that he has no trouble swallowing his levodopa.   Current Outpatient Medications on File Prior to Visit  Medication Sig Dispense Refill  . ALPRAZolam (XANAX) 0.25 MG tablet Take 0.25 mg by mouth as needed.      Marland Kitchen amiodarone (PACERONE) 200 MG tablet Take 200 mg by mouth daily.    Marland Kitchen aspirin EC 81 MG tablet Take 1 tablet (81 mg total) by mouth daily.    . calcitonin, salmon, (MIACALCIN/FORTICAL) 200 UNIT/ACT nasal spray  Place 1 spray into alternate nostrils daily.    . carbidopa-levodopa (SINEMET IR) 25-100 MG tablet Take 1.5 tablets by mouth 4 (four) times daily. 540 tablet 1  . Carbidopa-Levodopa ER (SINEMET CR) 25-100 MG tablet controlled release TAKE 1 TABLET BY MOUTH AT BEDTIME 90 tablet 0  . diltiazem (DILACOR XR) 180 MG 24 hr capsule Take 180 mg by mouth daily.    Marland Kitchen escitalopram (LEXAPRO) 10 MG tablet Take 10 mg by mouth daily.    Marland Kitchen guaiFENesin (MUCINEX) 600 MG 12 hr tablet Take 600 mg by mouth 2 (two) times daily as needed.    Marland Kitchen levothyroxine (LEVOTHROID) 25 MCG tablet Take 25 mcg by mouth daily.     No current facility-administered medications on file prior to visit.     Observations/Objective:   There were no vitals filed for this visit. GEN:  The patient appears stated age and is in NAD.  Neurological examination:  Orientation: The patient is alert and oriented x3. Cranial nerves: There is good facial symmetry.  Speech is fluent and clear. Motor: Strength is at least antigravity x4 Movement exam: Abnormal movements: Was not able to see his hands at rest to see if there is resting tremor Coordination: The right hand is disfigured from prior trauma.  Was able to do hand opening and closing and finger taps on the left. Gait and Station: Patient pushes off of the chair to arise.  He is wide based and mildly unsteady.     Assessment and Plan:  1.  Parkinson's disease  -continue carbidopa/levodopa 25/100, 1.5 tablets 4 times per day.    -Continue carbidopa/levodopa 25/100 CR at bedtime for nighttime tremor  -Home physical therapy is recommended.  -Patient has 24/day caregiving.  He will continue with that.  Discussed direct medication monitoring.   Follow Up Instructions: 6  months.  Patient wishes to try to keep this an evisit, as leaving the home is difficult  -I discussed the assessment and treatment plan with the patient. The patient was provided an opportunity to ask questions and  all were answered. The patient agreed with the plan and demonstrated an understanding of the instructions.   The patient was advised to call back or seek an in-person evaluation if the symptoms worsen or if the condition fails to improve as anticipated.    Total Time spent in visit with the patient was:  10 min, of which more than 50% of the time was spent in counseling and/or coordinating care on safety.   Pt understands and agrees with the plan of care outlined.     Alonza Bogus, DO

## 2019-06-13 ENCOUNTER — Other Ambulatory Visit: Payer: Self-pay

## 2019-06-13 ENCOUNTER — Telehealth (INDEPENDENT_AMBULATORY_CARE_PROVIDER_SITE_OTHER): Payer: Medicare Other | Admitting: Neurology

## 2019-06-13 ENCOUNTER — Encounter: Payer: Self-pay | Admitting: Neurology

## 2019-06-13 DIAGNOSIS — G20 Parkinson's disease: Secondary | ICD-10-CM

## 2019-06-14 DIAGNOSIS — G20 Parkinson's disease: Secondary | ICD-10-CM | POA: Diagnosis not present

## 2019-06-19 DIAGNOSIS — G20 Parkinson's disease: Secondary | ICD-10-CM | POA: Diagnosis not present

## 2019-06-21 DIAGNOSIS — G20 Parkinson's disease: Secondary | ICD-10-CM | POA: Diagnosis not present

## 2019-06-28 DIAGNOSIS — G20 Parkinson's disease: Secondary | ICD-10-CM | POA: Diagnosis not present

## 2019-06-29 DIAGNOSIS — G20 Parkinson's disease: Secondary | ICD-10-CM | POA: Diagnosis not present

## 2019-07-05 DIAGNOSIS — G20 Parkinson's disease: Secondary | ICD-10-CM | POA: Diagnosis not present

## 2019-07-11 DIAGNOSIS — G20 Parkinson's disease: Secondary | ICD-10-CM | POA: Diagnosis not present

## 2019-07-19 DIAGNOSIS — G20 Parkinson's disease: Secondary | ICD-10-CM | POA: Diagnosis not present

## 2019-07-26 DIAGNOSIS — G20 Parkinson's disease: Secondary | ICD-10-CM | POA: Diagnosis not present

## 2019-07-29 DIAGNOSIS — Z9181 History of falling: Secondary | ICD-10-CM | POA: Diagnosis not present

## 2019-07-29 DIAGNOSIS — Z7982 Long term (current) use of aspirin: Secondary | ICD-10-CM | POA: Diagnosis not present

## 2019-07-29 DIAGNOSIS — G20 Parkinson's disease: Secondary | ICD-10-CM | POA: Diagnosis not present

## 2019-08-03 DIAGNOSIS — Z7982 Long term (current) use of aspirin: Secondary | ICD-10-CM | POA: Diagnosis not present

## 2019-08-03 DIAGNOSIS — Z9181 History of falling: Secondary | ICD-10-CM | POA: Diagnosis not present

## 2019-08-03 DIAGNOSIS — G20 Parkinson's disease: Secondary | ICD-10-CM | POA: Diagnosis not present

## 2019-08-07 ENCOUNTER — Other Ambulatory Visit: Payer: Self-pay | Admitting: Neurology

## 2019-08-07 ENCOUNTER — Other Ambulatory Visit: Payer: Self-pay

## 2019-08-07 ENCOUNTER — Telehealth: Payer: Self-pay | Admitting: Neurology

## 2019-08-07 DIAGNOSIS — Y998 Other external cause status: Secondary | ICD-10-CM | POA: Diagnosis not present

## 2019-08-07 DIAGNOSIS — F419 Anxiety disorder, unspecified: Secondary | ICD-10-CM | POA: Diagnosis not present

## 2019-08-07 DIAGNOSIS — D509 Iron deficiency anemia, unspecified: Secondary | ICD-10-CM | POA: Diagnosis present

## 2019-08-07 DIAGNOSIS — S72002A Fracture of unspecified part of neck of left femur, initial encounter for closed fracture: Secondary | ICD-10-CM | POA: Diagnosis not present

## 2019-08-07 DIAGNOSIS — W1839XA Other fall on same level, initial encounter: Secondary | ICD-10-CM | POA: Diagnosis not present

## 2019-08-07 DIAGNOSIS — S7292XA Unspecified fracture of left femur, initial encounter for closed fracture: Secondary | ICD-10-CM | POA: Diagnosis not present

## 2019-08-07 DIAGNOSIS — M5489 Other dorsalgia: Secondary | ICD-10-CM | POA: Diagnosis not present

## 2019-08-07 DIAGNOSIS — I491 Atrial premature depolarization: Secondary | ICD-10-CM | POA: Diagnosis not present

## 2019-08-07 DIAGNOSIS — Z7401 Bed confinement status: Secondary | ICD-10-CM | POA: Diagnosis not present

## 2019-08-07 DIAGNOSIS — R251 Tremor, unspecified: Secondary | ICD-10-CM | POA: Diagnosis not present

## 2019-08-07 DIAGNOSIS — I480 Paroxysmal atrial fibrillation: Secondary | ICD-10-CM | POA: Diagnosis present

## 2019-08-07 DIAGNOSIS — R918 Other nonspecific abnormal finding of lung field: Secondary | ICD-10-CM | POA: Diagnosis not present

## 2019-08-07 DIAGNOSIS — R52 Pain, unspecified: Secondary | ICD-10-CM | POA: Diagnosis not present

## 2019-08-07 DIAGNOSIS — Z20822 Contact with and (suspected) exposure to covid-19: Secondary | ICD-10-CM | POA: Diagnosis not present

## 2019-08-07 DIAGNOSIS — M2550 Pain in unspecified joint: Secondary | ICD-10-CM | POA: Diagnosis not present

## 2019-08-07 DIAGNOSIS — F29 Unspecified psychosis not due to a substance or known physiological condition: Secondary | ICD-10-CM | POA: Diagnosis not present

## 2019-08-07 DIAGNOSIS — I447 Left bundle-branch block, unspecified: Secondary | ICD-10-CM | POA: Diagnosis not present

## 2019-08-07 DIAGNOSIS — S7222XA Displaced subtrochanteric fracture of left femur, initial encounter for closed fracture: Secondary | ICD-10-CM | POA: Diagnosis present

## 2019-08-07 DIAGNOSIS — Z66 Do not resuscitate: Secondary | ICD-10-CM | POA: Diagnosis present

## 2019-08-07 DIAGNOSIS — M9702XA Periprosthetic fracture around internal prosthetic left hip joint, initial encounter: Secondary | ICD-10-CM | POA: Diagnosis present

## 2019-08-07 DIAGNOSIS — M25552 Pain in left hip: Secondary | ICD-10-CM | POA: Diagnosis not present

## 2019-08-07 DIAGNOSIS — Z96653 Presence of artificial knee joint, bilateral: Secondary | ICD-10-CM | POA: Diagnosis present

## 2019-08-07 DIAGNOSIS — R296 Repeated falls: Secondary | ICD-10-CM | POA: Diagnosis present

## 2019-08-07 DIAGNOSIS — R0902 Hypoxemia: Secondary | ICD-10-CM | POA: Diagnosis not present

## 2019-08-07 DIAGNOSIS — S72332A Displaced oblique fracture of shaft of left femur, initial encounter for closed fracture: Secondary | ICD-10-CM | POA: Diagnosis not present

## 2019-08-07 DIAGNOSIS — F329 Major depressive disorder, single episode, unspecified: Secondary | ICD-10-CM | POA: Diagnosis present

## 2019-08-07 DIAGNOSIS — D62 Acute posthemorrhagic anemia: Secondary | ICD-10-CM | POA: Diagnosis not present

## 2019-08-07 DIAGNOSIS — Z96642 Presence of left artificial hip joint: Secondary | ICD-10-CM | POA: Diagnosis not present

## 2019-08-07 DIAGNOSIS — S3993XA Unspecified injury of pelvis, initial encounter: Secondary | ICD-10-CM | POA: Diagnosis not present

## 2019-08-07 DIAGNOSIS — E039 Hypothyroidism, unspecified: Secondary | ICD-10-CM | POA: Diagnosis present

## 2019-08-07 DIAGNOSIS — S72302A Unspecified fracture of shaft of left femur, initial encounter for closed fracture: Secondary | ICD-10-CM | POA: Diagnosis not present

## 2019-08-07 NOTE — Telephone Encounter (Signed)
This was already sent to pharmacy this am 08/07/2019.

## 2019-08-07 NOTE — Telephone Encounter (Signed)
Pamala Hurry called regarding this patient and his Carbidopa Levodopa medication. She said that Trooper at [facility] faxed a refill request but has not heard from our office. Please Call. Thank you

## 2019-08-08 DIAGNOSIS — I480 Paroxysmal atrial fibrillation: Secondary | ICD-10-CM | POA: Diagnosis not present

## 2019-08-08 DIAGNOSIS — R251 Tremor, unspecified: Secondary | ICD-10-CM | POA: Diagnosis not present

## 2019-08-08 DIAGNOSIS — S72302A Unspecified fracture of shaft of left femur, initial encounter for closed fracture: Secondary | ICD-10-CM | POA: Diagnosis not present

## 2019-08-08 DIAGNOSIS — M9702XA Periprosthetic fracture around internal prosthetic left hip joint, initial encounter: Secondary | ICD-10-CM | POA: Diagnosis not present

## 2019-08-08 DIAGNOSIS — S7292XA Unspecified fracture of left femur, initial encounter for closed fracture: Secondary | ICD-10-CM | POA: Diagnosis not present

## 2019-08-08 DIAGNOSIS — Z96642 Presence of left artificial hip joint: Secondary | ICD-10-CM | POA: Diagnosis not present

## 2019-08-08 DIAGNOSIS — F329 Major depressive disorder, single episode, unspecified: Secondary | ICD-10-CM | POA: Diagnosis not present

## 2019-08-08 DIAGNOSIS — E039 Hypothyroidism, unspecified: Secondary | ICD-10-CM | POA: Diagnosis not present

## 2019-08-08 DIAGNOSIS — S7222XA Displaced subtrochanteric fracture of left femur, initial encounter for closed fracture: Secondary | ICD-10-CM | POA: Diagnosis not present

## 2019-08-09 DIAGNOSIS — I480 Paroxysmal atrial fibrillation: Secondary | ICD-10-CM | POA: Diagnosis not present

## 2019-08-09 DIAGNOSIS — F329 Major depressive disorder, single episode, unspecified: Secondary | ICD-10-CM | POA: Diagnosis not present

## 2019-08-09 DIAGNOSIS — E039 Hypothyroidism, unspecified: Secondary | ICD-10-CM | POA: Diagnosis not present

## 2019-08-09 DIAGNOSIS — S7222XA Displaced subtrochanteric fracture of left femur, initial encounter for closed fracture: Secondary | ICD-10-CM | POA: Diagnosis not present

## 2019-08-09 DIAGNOSIS — M9702XA Periprosthetic fracture around internal prosthetic left hip joint, initial encounter: Secondary | ICD-10-CM | POA: Diagnosis not present

## 2019-08-09 DIAGNOSIS — R251 Tremor, unspecified: Secondary | ICD-10-CM | POA: Diagnosis not present

## 2019-08-10 DIAGNOSIS — E039 Hypothyroidism, unspecified: Secondary | ICD-10-CM | POA: Diagnosis not present

## 2019-08-10 DIAGNOSIS — F329 Major depressive disorder, single episode, unspecified: Secondary | ICD-10-CM | POA: Diagnosis not present

## 2019-08-10 DIAGNOSIS — R251 Tremor, unspecified: Secondary | ICD-10-CM | POA: Diagnosis not present

## 2019-08-10 DIAGNOSIS — M9702XA Periprosthetic fracture around internal prosthetic left hip joint, initial encounter: Secondary | ICD-10-CM | POA: Diagnosis not present

## 2019-08-10 DIAGNOSIS — I480 Paroxysmal atrial fibrillation: Secondary | ICD-10-CM | POA: Diagnosis not present

## 2019-08-10 DIAGNOSIS — S7222XA Displaced subtrochanteric fracture of left femur, initial encounter for closed fracture: Secondary | ICD-10-CM | POA: Diagnosis not present

## 2019-08-14 ENCOUNTER — Other Ambulatory Visit: Payer: Self-pay | Admitting: Neurology

## 2019-08-14 DIAGNOSIS — Z7982 Long term (current) use of aspirin: Secondary | ICD-10-CM | POA: Diagnosis not present

## 2019-08-14 DIAGNOSIS — Z9181 History of falling: Secondary | ICD-10-CM | POA: Diagnosis not present

## 2019-08-14 DIAGNOSIS — G20 Parkinson's disease: Secondary | ICD-10-CM | POA: Diagnosis not present

## 2019-08-16 DIAGNOSIS — Z7982 Long term (current) use of aspirin: Secondary | ICD-10-CM | POA: Diagnosis not present

## 2019-08-16 DIAGNOSIS — Z9181 History of falling: Secondary | ICD-10-CM | POA: Diagnosis not present

## 2019-08-16 DIAGNOSIS — G20 Parkinson's disease: Secondary | ICD-10-CM | POA: Diagnosis not present

## 2019-08-17 DIAGNOSIS — I480 Paroxysmal atrial fibrillation: Secondary | ICD-10-CM | POA: Diagnosis not present

## 2019-08-17 DIAGNOSIS — S72002A Fracture of unspecified part of neck of left femur, initial encounter for closed fracture: Secondary | ICD-10-CM | POA: Diagnosis not present

## 2019-08-17 DIAGNOSIS — R410 Disorientation, unspecified: Secondary | ICD-10-CM | POA: Diagnosis not present

## 2019-08-21 DIAGNOSIS — Z7982 Long term (current) use of aspirin: Secondary | ICD-10-CM | POA: Diagnosis not present

## 2019-08-21 DIAGNOSIS — G20 Parkinson's disease: Secondary | ICD-10-CM | POA: Diagnosis not present

## 2019-08-21 DIAGNOSIS — Z9181 History of falling: Secondary | ICD-10-CM | POA: Diagnosis not present

## 2019-08-22 DIAGNOSIS — G20 Parkinson's disease: Secondary | ICD-10-CM | POA: Diagnosis not present

## 2019-08-22 DIAGNOSIS — Z9181 History of falling: Secondary | ICD-10-CM | POA: Diagnosis not present

## 2019-08-22 DIAGNOSIS — Z7982 Long term (current) use of aspirin: Secondary | ICD-10-CM | POA: Diagnosis not present

## 2019-08-24 DIAGNOSIS — Z7982 Long term (current) use of aspirin: Secondary | ICD-10-CM | POA: Diagnosis not present

## 2019-08-24 DIAGNOSIS — G20 Parkinson's disease: Secondary | ICD-10-CM | POA: Diagnosis not present

## 2019-08-24 DIAGNOSIS — Z9181 History of falling: Secondary | ICD-10-CM | POA: Diagnosis not present

## 2019-08-25 DIAGNOSIS — Z7982 Long term (current) use of aspirin: Secondary | ICD-10-CM | POA: Diagnosis not present

## 2019-08-25 DIAGNOSIS — Z9181 History of falling: Secondary | ICD-10-CM | POA: Diagnosis not present

## 2019-08-25 DIAGNOSIS — G20 Parkinson's disease: Secondary | ICD-10-CM | POA: Diagnosis not present

## 2019-08-28 DIAGNOSIS — Z7982 Long term (current) use of aspirin: Secondary | ICD-10-CM | POA: Diagnosis not present

## 2019-08-28 DIAGNOSIS — Z96649 Presence of unspecified artificial hip joint: Secondary | ICD-10-CM | POA: Diagnosis not present

## 2019-08-28 DIAGNOSIS — Z9181 History of falling: Secondary | ICD-10-CM | POA: Diagnosis not present

## 2019-08-28 DIAGNOSIS — F329 Major depressive disorder, single episode, unspecified: Secondary | ICD-10-CM | POA: Diagnosis not present

## 2019-08-28 DIAGNOSIS — S7222XD Displaced subtrochanteric fracture of left femur, subsequent encounter for closed fracture with routine healing: Secondary | ICD-10-CM | POA: Diagnosis not present

## 2019-08-28 DIAGNOSIS — E039 Hypothyroidism, unspecified: Secondary | ICD-10-CM | POA: Diagnosis not present

## 2019-08-28 DIAGNOSIS — M9702XD Periprosthetic fracture around internal prosthetic left hip joint, subsequent encounter: Secondary | ICD-10-CM | POA: Diagnosis not present

## 2019-08-28 DIAGNOSIS — Z993 Dependence on wheelchair: Secondary | ICD-10-CM | POA: Diagnosis not present

## 2019-08-28 DIAGNOSIS — G20 Parkinson's disease: Secondary | ICD-10-CM | POA: Diagnosis not present

## 2019-08-28 DIAGNOSIS — M1990 Unspecified osteoarthritis, unspecified site: Secondary | ICD-10-CM | POA: Diagnosis not present

## 2019-08-28 DIAGNOSIS — Z7901 Long term (current) use of anticoagulants: Secondary | ICD-10-CM | POA: Diagnosis not present

## 2019-08-28 DIAGNOSIS — M978XXA Periprosthetic fracture around other internal prosthetic joint, initial encounter: Secondary | ICD-10-CM | POA: Diagnosis not present

## 2019-08-28 DIAGNOSIS — F419 Anxiety disorder, unspecified: Secondary | ICD-10-CM | POA: Diagnosis not present

## 2019-08-28 DIAGNOSIS — I480 Paroxysmal atrial fibrillation: Secondary | ICD-10-CM | POA: Diagnosis not present

## 2019-08-28 DIAGNOSIS — S7222XA Displaced subtrochanteric fracture of left femur, initial encounter for closed fracture: Secondary | ICD-10-CM | POA: Diagnosis not present

## 2019-08-29 DIAGNOSIS — M9702XD Periprosthetic fracture around internal prosthetic left hip joint, subsequent encounter: Secondary | ICD-10-CM | POA: Diagnosis not present

## 2019-08-29 DIAGNOSIS — S7222XD Displaced subtrochanteric fracture of left femur, subsequent encounter for closed fracture with routine healing: Secondary | ICD-10-CM | POA: Diagnosis not present

## 2019-08-29 DIAGNOSIS — I480 Paroxysmal atrial fibrillation: Secondary | ICD-10-CM | POA: Diagnosis not present

## 2019-08-29 DIAGNOSIS — F419 Anxiety disorder, unspecified: Secondary | ICD-10-CM | POA: Diagnosis not present

## 2019-08-29 DIAGNOSIS — F329 Major depressive disorder, single episode, unspecified: Secondary | ICD-10-CM | POA: Diagnosis not present

## 2019-08-29 DIAGNOSIS — G20 Parkinson's disease: Secondary | ICD-10-CM | POA: Diagnosis not present

## 2019-08-31 DIAGNOSIS — S7222XD Displaced subtrochanteric fracture of left femur, subsequent encounter for closed fracture with routine healing: Secondary | ICD-10-CM | POA: Diagnosis not present

## 2019-08-31 DIAGNOSIS — M9702XD Periprosthetic fracture around internal prosthetic left hip joint, subsequent encounter: Secondary | ICD-10-CM | POA: Diagnosis not present

## 2019-08-31 DIAGNOSIS — F329 Major depressive disorder, single episode, unspecified: Secondary | ICD-10-CM | POA: Diagnosis not present

## 2019-08-31 DIAGNOSIS — G20 Parkinson's disease: Secondary | ICD-10-CM | POA: Diagnosis not present

## 2019-08-31 DIAGNOSIS — I480 Paroxysmal atrial fibrillation: Secondary | ICD-10-CM | POA: Diagnosis not present

## 2019-08-31 DIAGNOSIS — F419 Anxiety disorder, unspecified: Secondary | ICD-10-CM | POA: Diagnosis not present

## 2019-09-04 DIAGNOSIS — S7222XD Displaced subtrochanteric fracture of left femur, subsequent encounter for closed fracture with routine healing: Secondary | ICD-10-CM | POA: Diagnosis not present

## 2019-09-04 DIAGNOSIS — F419 Anxiety disorder, unspecified: Secondary | ICD-10-CM | POA: Diagnosis not present

## 2019-09-04 DIAGNOSIS — M9702XD Periprosthetic fracture around internal prosthetic left hip joint, subsequent encounter: Secondary | ICD-10-CM | POA: Diagnosis not present

## 2019-09-04 DIAGNOSIS — I480 Paroxysmal atrial fibrillation: Secondary | ICD-10-CM | POA: Diagnosis not present

## 2019-09-04 DIAGNOSIS — F329 Major depressive disorder, single episode, unspecified: Secondary | ICD-10-CM | POA: Diagnosis not present

## 2019-09-04 DIAGNOSIS — G20 Parkinson's disease: Secondary | ICD-10-CM | POA: Diagnosis not present

## 2019-09-06 DIAGNOSIS — S7222XD Displaced subtrochanteric fracture of left femur, subsequent encounter for closed fracture with routine healing: Secondary | ICD-10-CM | POA: Diagnosis not present

## 2019-09-06 DIAGNOSIS — F329 Major depressive disorder, single episode, unspecified: Secondary | ICD-10-CM | POA: Diagnosis not present

## 2019-09-06 DIAGNOSIS — I480 Paroxysmal atrial fibrillation: Secondary | ICD-10-CM | POA: Diagnosis not present

## 2019-09-06 DIAGNOSIS — M9702XD Periprosthetic fracture around internal prosthetic left hip joint, subsequent encounter: Secondary | ICD-10-CM | POA: Diagnosis not present

## 2019-09-06 DIAGNOSIS — G20 Parkinson's disease: Secondary | ICD-10-CM | POA: Diagnosis not present

## 2019-09-06 DIAGNOSIS — F419 Anxiety disorder, unspecified: Secondary | ICD-10-CM | POA: Diagnosis not present

## 2019-09-07 DIAGNOSIS — M9702XD Periprosthetic fracture around internal prosthetic left hip joint, subsequent encounter: Secondary | ICD-10-CM | POA: Diagnosis not present

## 2019-09-07 DIAGNOSIS — I480 Paroxysmal atrial fibrillation: Secondary | ICD-10-CM | POA: Diagnosis not present

## 2019-09-07 DIAGNOSIS — G20 Parkinson's disease: Secondary | ICD-10-CM | POA: Diagnosis not present

## 2019-09-07 DIAGNOSIS — F419 Anxiety disorder, unspecified: Secondary | ICD-10-CM | POA: Diagnosis not present

## 2019-09-07 DIAGNOSIS — S7222XD Displaced subtrochanteric fracture of left femur, subsequent encounter for closed fracture with routine healing: Secondary | ICD-10-CM | POA: Diagnosis not present

## 2019-09-07 DIAGNOSIS — F329 Major depressive disorder, single episode, unspecified: Secondary | ICD-10-CM | POA: Diagnosis not present

## 2019-09-08 DIAGNOSIS — F419 Anxiety disorder, unspecified: Secondary | ICD-10-CM | POA: Diagnosis not present

## 2019-09-08 DIAGNOSIS — S7222XD Displaced subtrochanteric fracture of left femur, subsequent encounter for closed fracture with routine healing: Secondary | ICD-10-CM | POA: Diagnosis not present

## 2019-09-08 DIAGNOSIS — F329 Major depressive disorder, single episode, unspecified: Secondary | ICD-10-CM | POA: Diagnosis not present

## 2019-09-08 DIAGNOSIS — G20 Parkinson's disease: Secondary | ICD-10-CM | POA: Diagnosis not present

## 2019-09-08 DIAGNOSIS — M9702XD Periprosthetic fracture around internal prosthetic left hip joint, subsequent encounter: Secondary | ICD-10-CM | POA: Diagnosis not present

## 2019-09-08 DIAGNOSIS — I480 Paroxysmal atrial fibrillation: Secondary | ICD-10-CM | POA: Diagnosis not present

## 2019-09-12 DIAGNOSIS — S7222XD Displaced subtrochanteric fracture of left femur, subsequent encounter for closed fracture with routine healing: Secondary | ICD-10-CM | POA: Diagnosis not present

## 2019-09-12 DIAGNOSIS — G20 Parkinson's disease: Secondary | ICD-10-CM | POA: Diagnosis not present

## 2019-09-12 DIAGNOSIS — I480 Paroxysmal atrial fibrillation: Secondary | ICD-10-CM | POA: Diagnosis not present

## 2019-09-12 DIAGNOSIS — M9702XD Periprosthetic fracture around internal prosthetic left hip joint, subsequent encounter: Secondary | ICD-10-CM | POA: Diagnosis not present

## 2019-09-12 DIAGNOSIS — F329 Major depressive disorder, single episode, unspecified: Secondary | ICD-10-CM | POA: Diagnosis not present

## 2019-09-12 DIAGNOSIS — F419 Anxiety disorder, unspecified: Secondary | ICD-10-CM | POA: Diagnosis not present

## 2019-09-19 DIAGNOSIS — Z789 Other specified health status: Secondary | ICD-10-CM | POA: Diagnosis not present

## 2019-09-19 DIAGNOSIS — Z96649 Presence of unspecified artificial hip joint: Secondary | ICD-10-CM | POA: Diagnosis not present

## 2019-09-19 DIAGNOSIS — K625 Hemorrhage of anus and rectum: Secondary | ICD-10-CM | POA: Diagnosis not present

## 2019-09-19 DIAGNOSIS — R195 Other fecal abnormalities: Secondary | ICD-10-CM | POA: Diagnosis not present

## 2019-09-19 DIAGNOSIS — R58 Hemorrhage, not elsewhere classified: Secondary | ICD-10-CM | POA: Diagnosis not present

## 2019-09-19 DIAGNOSIS — K921 Melena: Secondary | ICD-10-CM | POA: Diagnosis not present

## 2019-09-19 DIAGNOSIS — D6832 Hemorrhagic disorder due to extrinsic circulating anticoagulants: Secondary | ICD-10-CM | POA: Diagnosis present

## 2019-09-19 DIAGNOSIS — D62 Acute posthemorrhagic anemia: Secondary | ICD-10-CM | POA: Diagnosis not present

## 2019-09-19 DIAGNOSIS — Z9889 Other specified postprocedural states: Secondary | ICD-10-CM | POA: Diagnosis not present

## 2019-09-19 DIAGNOSIS — I480 Paroxysmal atrial fibrillation: Secondary | ICD-10-CM | POA: Diagnosis present

## 2019-09-19 DIAGNOSIS — I482 Chronic atrial fibrillation, unspecified: Secondary | ICD-10-CM | POA: Diagnosis not present

## 2019-09-19 DIAGNOSIS — T45515A Adverse effect of anticoagulants, initial encounter: Secondary | ICD-10-CM | POA: Diagnosis present

## 2019-09-19 DIAGNOSIS — M1990 Unspecified osteoarthritis, unspecified site: Secondary | ICD-10-CM | POA: Diagnosis not present

## 2019-09-19 DIAGNOSIS — G20 Parkinson's disease: Secondary | ICD-10-CM | POA: Diagnosis present

## 2019-09-19 DIAGNOSIS — E039 Hypothyroidism, unspecified: Secondary | ICD-10-CM | POA: Diagnosis present

## 2019-09-19 DIAGNOSIS — S72302A Unspecified fracture of shaft of left femur, initial encounter for closed fracture: Secondary | ICD-10-CM | POA: Diagnosis not present

## 2019-09-19 DIAGNOSIS — I4891 Unspecified atrial fibrillation: Secondary | ICD-10-CM | POA: Diagnosis not present

## 2019-09-22 DIAGNOSIS — I480 Paroxysmal atrial fibrillation: Secondary | ICD-10-CM | POA: Diagnosis not present

## 2019-09-22 DIAGNOSIS — S7222XD Displaced subtrochanteric fracture of left femur, subsequent encounter for closed fracture with routine healing: Secondary | ICD-10-CM | POA: Diagnosis not present

## 2019-09-22 DIAGNOSIS — F419 Anxiety disorder, unspecified: Secondary | ICD-10-CM | POA: Diagnosis not present

## 2019-09-22 DIAGNOSIS — M9702XD Periprosthetic fracture around internal prosthetic left hip joint, subsequent encounter: Secondary | ICD-10-CM | POA: Diagnosis not present

## 2019-09-22 DIAGNOSIS — G20 Parkinson's disease: Secondary | ICD-10-CM | POA: Diagnosis not present

## 2019-09-22 DIAGNOSIS — F329 Major depressive disorder, single episode, unspecified: Secondary | ICD-10-CM | POA: Diagnosis not present

## 2019-09-27 DIAGNOSIS — S7222XD Displaced subtrochanteric fracture of left femur, subsequent encounter for closed fracture with routine healing: Secondary | ICD-10-CM | POA: Diagnosis not present

## 2019-09-27 DIAGNOSIS — Z7982 Long term (current) use of aspirin: Secondary | ICD-10-CM | POA: Diagnosis not present

## 2019-09-27 DIAGNOSIS — Z7901 Long term (current) use of anticoagulants: Secondary | ICD-10-CM | POA: Diagnosis not present

## 2019-09-27 DIAGNOSIS — Z993 Dependence on wheelchair: Secondary | ICD-10-CM | POA: Diagnosis not present

## 2019-09-27 DIAGNOSIS — Z9181 History of falling: Secondary | ICD-10-CM | POA: Diagnosis not present

## 2019-09-27 DIAGNOSIS — G20 Parkinson's disease: Secondary | ICD-10-CM | POA: Diagnosis not present

## 2019-09-27 DIAGNOSIS — I480 Paroxysmal atrial fibrillation: Secondary | ICD-10-CM | POA: Diagnosis not present

## 2019-09-27 DIAGNOSIS — M1990 Unspecified osteoarthritis, unspecified site: Secondary | ICD-10-CM | POA: Diagnosis not present

## 2019-09-27 DIAGNOSIS — F419 Anxiety disorder, unspecified: Secondary | ICD-10-CM | POA: Diagnosis not present

## 2019-09-27 DIAGNOSIS — M9702XD Periprosthetic fracture around internal prosthetic left hip joint, subsequent encounter: Secondary | ICD-10-CM | POA: Diagnosis not present

## 2019-09-27 DIAGNOSIS — E039 Hypothyroidism, unspecified: Secondary | ICD-10-CM | POA: Diagnosis not present

## 2019-09-27 DIAGNOSIS — K625 Hemorrhage of anus and rectum: Secondary | ICD-10-CM | POA: Diagnosis not present

## 2019-09-27 DIAGNOSIS — F329 Major depressive disorder, single episode, unspecified: Secondary | ICD-10-CM | POA: Diagnosis not present

## 2019-10-02 DIAGNOSIS — G20 Parkinson's disease: Secondary | ICD-10-CM | POA: Diagnosis not present

## 2019-10-02 DIAGNOSIS — K625 Hemorrhage of anus and rectum: Secondary | ICD-10-CM | POA: Diagnosis not present

## 2019-10-02 DIAGNOSIS — S7222XD Displaced subtrochanteric fracture of left femur, subsequent encounter for closed fracture with routine healing: Secondary | ICD-10-CM | POA: Diagnosis not present

## 2019-10-02 DIAGNOSIS — F329 Major depressive disorder, single episode, unspecified: Secondary | ICD-10-CM | POA: Diagnosis not present

## 2019-10-02 DIAGNOSIS — I480 Paroxysmal atrial fibrillation: Secondary | ICD-10-CM | POA: Diagnosis not present

## 2019-10-02 DIAGNOSIS — M9702XD Periprosthetic fracture around internal prosthetic left hip joint, subsequent encounter: Secondary | ICD-10-CM | POA: Diagnosis not present

## 2019-10-05 DIAGNOSIS — F329 Major depressive disorder, single episode, unspecified: Secondary | ICD-10-CM | POA: Diagnosis not present

## 2019-10-05 DIAGNOSIS — M9702XD Periprosthetic fracture around internal prosthetic left hip joint, subsequent encounter: Secondary | ICD-10-CM | POA: Diagnosis not present

## 2019-10-05 DIAGNOSIS — S7222XD Displaced subtrochanteric fracture of left femur, subsequent encounter for closed fracture with routine healing: Secondary | ICD-10-CM | POA: Diagnosis not present

## 2019-10-05 DIAGNOSIS — I480 Paroxysmal atrial fibrillation: Secondary | ICD-10-CM | POA: Diagnosis not present

## 2019-10-05 DIAGNOSIS — G20 Parkinson's disease: Secondary | ICD-10-CM | POA: Diagnosis not present

## 2019-10-05 DIAGNOSIS — K625 Hemorrhage of anus and rectum: Secondary | ICD-10-CM | POA: Diagnosis not present

## 2019-10-09 DIAGNOSIS — M9702XD Periprosthetic fracture around internal prosthetic left hip joint, subsequent encounter: Secondary | ICD-10-CM | POA: Diagnosis not present

## 2019-10-09 DIAGNOSIS — I480 Paroxysmal atrial fibrillation: Secondary | ICD-10-CM | POA: Diagnosis not present

## 2019-10-09 DIAGNOSIS — F329 Major depressive disorder, single episode, unspecified: Secondary | ICD-10-CM | POA: Diagnosis not present

## 2019-10-09 DIAGNOSIS — S7222XD Displaced subtrochanteric fracture of left femur, subsequent encounter for closed fracture with routine healing: Secondary | ICD-10-CM | POA: Diagnosis not present

## 2019-10-09 DIAGNOSIS — K625 Hemorrhage of anus and rectum: Secondary | ICD-10-CM | POA: Diagnosis not present

## 2019-10-09 DIAGNOSIS — G20 Parkinson's disease: Secondary | ICD-10-CM | POA: Diagnosis not present

## 2019-10-10 DIAGNOSIS — G20 Parkinson's disease: Secondary | ICD-10-CM | POA: Diagnosis not present

## 2019-10-10 DIAGNOSIS — I480 Paroxysmal atrial fibrillation: Secondary | ICD-10-CM | POA: Diagnosis not present

## 2019-10-10 DIAGNOSIS — M9702XD Periprosthetic fracture around internal prosthetic left hip joint, subsequent encounter: Secondary | ICD-10-CM | POA: Diagnosis not present

## 2019-10-10 DIAGNOSIS — K625 Hemorrhage of anus and rectum: Secondary | ICD-10-CM | POA: Diagnosis not present

## 2019-10-10 DIAGNOSIS — S7222XD Displaced subtrochanteric fracture of left femur, subsequent encounter for closed fracture with routine healing: Secondary | ICD-10-CM | POA: Diagnosis not present

## 2019-10-10 DIAGNOSIS — F329 Major depressive disorder, single episode, unspecified: Secondary | ICD-10-CM | POA: Diagnosis not present

## 2019-10-16 DIAGNOSIS — M9702XD Periprosthetic fracture around internal prosthetic left hip joint, subsequent encounter: Secondary | ICD-10-CM | POA: Diagnosis not present

## 2019-10-16 DIAGNOSIS — F329 Major depressive disorder, single episode, unspecified: Secondary | ICD-10-CM | POA: Diagnosis not present

## 2019-10-16 DIAGNOSIS — S7222XD Displaced subtrochanteric fracture of left femur, subsequent encounter for closed fracture with routine healing: Secondary | ICD-10-CM | POA: Diagnosis not present

## 2019-10-16 DIAGNOSIS — I480 Paroxysmal atrial fibrillation: Secondary | ICD-10-CM | POA: Diagnosis not present

## 2019-10-16 DIAGNOSIS — K625 Hemorrhage of anus and rectum: Secondary | ICD-10-CM | POA: Diagnosis not present

## 2019-10-16 DIAGNOSIS — G20 Parkinson's disease: Secondary | ICD-10-CM | POA: Diagnosis not present

## 2019-10-17 DIAGNOSIS — S7222XD Displaced subtrochanteric fracture of left femur, subsequent encounter for closed fracture with routine healing: Secondary | ICD-10-CM | POA: Diagnosis not present

## 2019-10-17 DIAGNOSIS — K625 Hemorrhage of anus and rectum: Secondary | ICD-10-CM | POA: Diagnosis not present

## 2019-10-17 DIAGNOSIS — I480 Paroxysmal atrial fibrillation: Secondary | ICD-10-CM | POA: Diagnosis not present

## 2019-10-17 DIAGNOSIS — F329 Major depressive disorder, single episode, unspecified: Secondary | ICD-10-CM | POA: Diagnosis not present

## 2019-10-17 DIAGNOSIS — M9702XD Periprosthetic fracture around internal prosthetic left hip joint, subsequent encounter: Secondary | ICD-10-CM | POA: Diagnosis not present

## 2019-10-17 DIAGNOSIS — G20 Parkinson's disease: Secondary | ICD-10-CM | POA: Diagnosis not present

## 2019-10-23 DIAGNOSIS — K625 Hemorrhage of anus and rectum: Secondary | ICD-10-CM | POA: Diagnosis not present

## 2019-10-23 DIAGNOSIS — F329 Major depressive disorder, single episode, unspecified: Secondary | ICD-10-CM | POA: Diagnosis not present

## 2019-10-23 DIAGNOSIS — G20 Parkinson's disease: Secondary | ICD-10-CM | POA: Diagnosis not present

## 2019-10-23 DIAGNOSIS — I480 Paroxysmal atrial fibrillation: Secondary | ICD-10-CM | POA: Diagnosis not present

## 2019-10-23 DIAGNOSIS — S7222XD Displaced subtrochanteric fracture of left femur, subsequent encounter for closed fracture with routine healing: Secondary | ICD-10-CM | POA: Diagnosis not present

## 2019-10-23 DIAGNOSIS — M9702XD Periprosthetic fracture around internal prosthetic left hip joint, subsequent encounter: Secondary | ICD-10-CM | POA: Diagnosis not present

## 2019-10-26 DIAGNOSIS — K625 Hemorrhage of anus and rectum: Secondary | ICD-10-CM | POA: Diagnosis not present

## 2019-10-26 DIAGNOSIS — M9702XD Periprosthetic fracture around internal prosthetic left hip joint, subsequent encounter: Secondary | ICD-10-CM | POA: Diagnosis not present

## 2019-10-26 DIAGNOSIS — G20 Parkinson's disease: Secondary | ICD-10-CM | POA: Diagnosis not present

## 2019-10-26 DIAGNOSIS — S7222XD Displaced subtrochanteric fracture of left femur, subsequent encounter for closed fracture with routine healing: Secondary | ICD-10-CM | POA: Diagnosis not present

## 2019-10-26 DIAGNOSIS — F329 Major depressive disorder, single episode, unspecified: Secondary | ICD-10-CM | POA: Diagnosis not present

## 2019-10-26 DIAGNOSIS — I480 Paroxysmal atrial fibrillation: Secondary | ICD-10-CM | POA: Diagnosis not present

## 2019-10-27 DIAGNOSIS — F329 Major depressive disorder, single episode, unspecified: Secondary | ICD-10-CM | POA: Diagnosis not present

## 2019-10-27 DIAGNOSIS — M1990 Unspecified osteoarthritis, unspecified site: Secondary | ICD-10-CM | POA: Diagnosis not present

## 2019-10-27 DIAGNOSIS — F419 Anxiety disorder, unspecified: Secondary | ICD-10-CM | POA: Diagnosis not present

## 2019-10-27 DIAGNOSIS — M9702XD Periprosthetic fracture around internal prosthetic left hip joint, subsequent encounter: Secondary | ICD-10-CM | POA: Diagnosis not present

## 2019-10-27 DIAGNOSIS — S7222XD Displaced subtrochanteric fracture of left femur, subsequent encounter for closed fracture with routine healing: Secondary | ICD-10-CM | POA: Diagnosis not present

## 2019-10-27 DIAGNOSIS — E039 Hypothyroidism, unspecified: Secondary | ICD-10-CM | POA: Diagnosis not present

## 2019-10-27 DIAGNOSIS — G20 Parkinson's disease: Secondary | ICD-10-CM | POA: Diagnosis not present

## 2019-10-27 DIAGNOSIS — Z7982 Long term (current) use of aspirin: Secondary | ICD-10-CM | POA: Diagnosis not present

## 2019-10-27 DIAGNOSIS — Z7901 Long term (current) use of anticoagulants: Secondary | ICD-10-CM | POA: Diagnosis not present

## 2019-10-27 DIAGNOSIS — Z9181 History of falling: Secondary | ICD-10-CM | POA: Diagnosis not present

## 2019-10-27 DIAGNOSIS — K625 Hemorrhage of anus and rectum: Secondary | ICD-10-CM | POA: Diagnosis not present

## 2019-10-27 DIAGNOSIS — Z993 Dependence on wheelchair: Secondary | ICD-10-CM | POA: Diagnosis not present

## 2019-10-27 DIAGNOSIS — I480 Paroxysmal atrial fibrillation: Secondary | ICD-10-CM | POA: Diagnosis not present

## 2019-10-30 DIAGNOSIS — M9702XD Periprosthetic fracture around internal prosthetic left hip joint, subsequent encounter: Secondary | ICD-10-CM | POA: Diagnosis not present

## 2019-10-30 DIAGNOSIS — G20 Parkinson's disease: Secondary | ICD-10-CM | POA: Diagnosis not present

## 2019-10-30 DIAGNOSIS — K625 Hemorrhage of anus and rectum: Secondary | ICD-10-CM | POA: Diagnosis not present

## 2019-10-30 DIAGNOSIS — S7222XD Displaced subtrochanteric fracture of left femur, subsequent encounter for closed fracture with routine healing: Secondary | ICD-10-CM | POA: Diagnosis not present

## 2019-10-30 DIAGNOSIS — I480 Paroxysmal atrial fibrillation: Secondary | ICD-10-CM | POA: Diagnosis not present

## 2019-10-30 DIAGNOSIS — F329 Major depressive disorder, single episode, unspecified: Secondary | ICD-10-CM | POA: Diagnosis not present

## 2019-10-31 DIAGNOSIS — I480 Paroxysmal atrial fibrillation: Secondary | ICD-10-CM | POA: Diagnosis not present

## 2019-10-31 DIAGNOSIS — K625 Hemorrhage of anus and rectum: Secondary | ICD-10-CM | POA: Diagnosis not present

## 2019-10-31 DIAGNOSIS — M9702XD Periprosthetic fracture around internal prosthetic left hip joint, subsequent encounter: Secondary | ICD-10-CM | POA: Diagnosis not present

## 2019-10-31 DIAGNOSIS — S7222XD Displaced subtrochanteric fracture of left femur, subsequent encounter for closed fracture with routine healing: Secondary | ICD-10-CM | POA: Diagnosis not present

## 2019-10-31 DIAGNOSIS — G20 Parkinson's disease: Secondary | ICD-10-CM | POA: Diagnosis not present

## 2019-10-31 DIAGNOSIS — F329 Major depressive disorder, single episode, unspecified: Secondary | ICD-10-CM | POA: Diagnosis not present

## 2019-11-06 DIAGNOSIS — F329 Major depressive disorder, single episode, unspecified: Secondary | ICD-10-CM | POA: Diagnosis not present

## 2019-11-06 DIAGNOSIS — M9702XD Periprosthetic fracture around internal prosthetic left hip joint, subsequent encounter: Secondary | ICD-10-CM | POA: Diagnosis not present

## 2019-11-06 DIAGNOSIS — S7222XD Displaced subtrochanteric fracture of left femur, subsequent encounter for closed fracture with routine healing: Secondary | ICD-10-CM | POA: Diagnosis not present

## 2019-11-06 DIAGNOSIS — K625 Hemorrhage of anus and rectum: Secondary | ICD-10-CM | POA: Diagnosis not present

## 2019-11-06 DIAGNOSIS — I480 Paroxysmal atrial fibrillation: Secondary | ICD-10-CM | POA: Diagnosis not present

## 2019-11-06 DIAGNOSIS — G20 Parkinson's disease: Secondary | ICD-10-CM | POA: Diagnosis not present

## 2019-11-08 DIAGNOSIS — K625 Hemorrhage of anus and rectum: Secondary | ICD-10-CM | POA: Diagnosis not present

## 2019-11-08 DIAGNOSIS — F329 Major depressive disorder, single episode, unspecified: Secondary | ICD-10-CM | POA: Diagnosis not present

## 2019-11-08 DIAGNOSIS — G20 Parkinson's disease: Secondary | ICD-10-CM | POA: Diagnosis not present

## 2019-11-08 DIAGNOSIS — S7222XD Displaced subtrochanteric fracture of left femur, subsequent encounter for closed fracture with routine healing: Secondary | ICD-10-CM | POA: Diagnosis not present

## 2019-11-08 DIAGNOSIS — I480 Paroxysmal atrial fibrillation: Secondary | ICD-10-CM | POA: Diagnosis not present

## 2019-11-08 DIAGNOSIS — M9702XD Periprosthetic fracture around internal prosthetic left hip joint, subsequent encounter: Secondary | ICD-10-CM | POA: Diagnosis not present

## 2019-11-09 DIAGNOSIS — I10 Essential (primary) hypertension: Secondary | ICD-10-CM | POA: Diagnosis not present

## 2019-11-09 DIAGNOSIS — R944 Abnormal results of kidney function studies: Secondary | ICD-10-CM | POA: Diagnosis not present

## 2019-11-09 DIAGNOSIS — R040 Epistaxis: Secondary | ICD-10-CM | POA: Diagnosis not present

## 2019-11-09 DIAGNOSIS — M21941 Unspecified acquired deformity of hand, right hand: Secondary | ICD-10-CM | POA: Diagnosis not present

## 2019-11-09 DIAGNOSIS — R739 Hyperglycemia, unspecified: Secondary | ICD-10-CM | POA: Diagnosis not present

## 2019-11-09 DIAGNOSIS — H6123 Impacted cerumen, bilateral: Secondary | ICD-10-CM | POA: Diagnosis not present

## 2019-11-09 DIAGNOSIS — R402412 Glasgow coma scale score 13-15, at arrival to emergency department: Secondary | ICD-10-CM | POA: Diagnosis not present

## 2019-11-09 DIAGNOSIS — R0902 Hypoxemia: Secondary | ICD-10-CM | POA: Diagnosis not present

## 2019-11-09 DIAGNOSIS — R58 Hemorrhage, not elsewhere classified: Secondary | ICD-10-CM | POA: Diagnosis not present

## 2019-11-13 DIAGNOSIS — S7222XD Displaced subtrochanteric fracture of left femur, subsequent encounter for closed fracture with routine healing: Secondary | ICD-10-CM | POA: Diagnosis not present

## 2019-11-13 DIAGNOSIS — F329 Major depressive disorder, single episode, unspecified: Secondary | ICD-10-CM | POA: Diagnosis not present

## 2019-11-13 DIAGNOSIS — K625 Hemorrhage of anus and rectum: Secondary | ICD-10-CM | POA: Diagnosis not present

## 2019-11-13 DIAGNOSIS — G20 Parkinson's disease: Secondary | ICD-10-CM | POA: Diagnosis not present

## 2019-11-13 DIAGNOSIS — M9702XD Periprosthetic fracture around internal prosthetic left hip joint, subsequent encounter: Secondary | ICD-10-CM | POA: Diagnosis not present

## 2019-11-13 DIAGNOSIS — I480 Paroxysmal atrial fibrillation: Secondary | ICD-10-CM | POA: Diagnosis not present

## 2019-11-16 ENCOUNTER — Other Ambulatory Visit: Payer: Self-pay | Admitting: Neurology

## 2019-11-17 IMAGING — CT CT LUMBAR SPINE WITHOUT CONTRAST
1 of 6 series · 6 of 14 positions shown, 8 images · non-contrast
Comparison: CT abdomen and pelvis 12/27/2015.

CLINICAL DATA: Gait disturbance.  Low back pain.

EXAM:
CT LUMBAR SPINE WITHOUT CONTRAST
TECHNIQUE: Multidetector CT imaging of the lumbar spine was performed without
intravenous contrast administration. Multiplanar CT image
reconstructions were also generated.

[Series 3: l spine soft · axial · 0.34mm/px · z∈[-213,-42]mm · 6 of 81 slices shown, 8 images]
[im 12/81  soft-tissue]
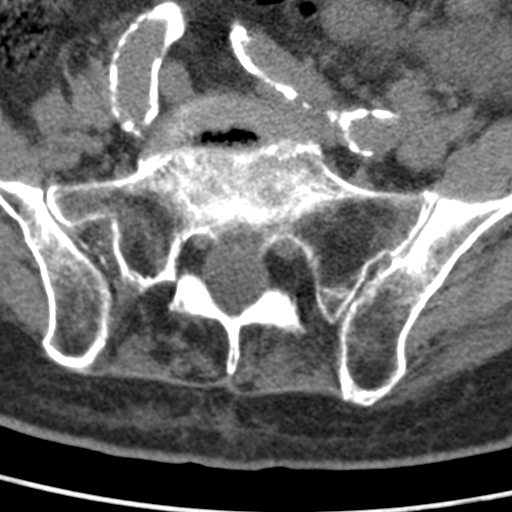
[im 12/81  bone]
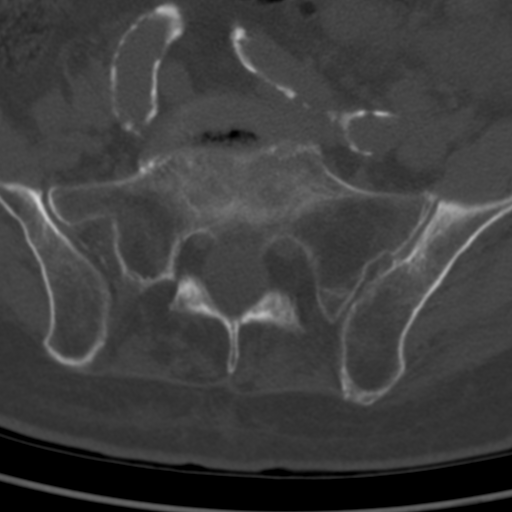
[im 23/81  bone]
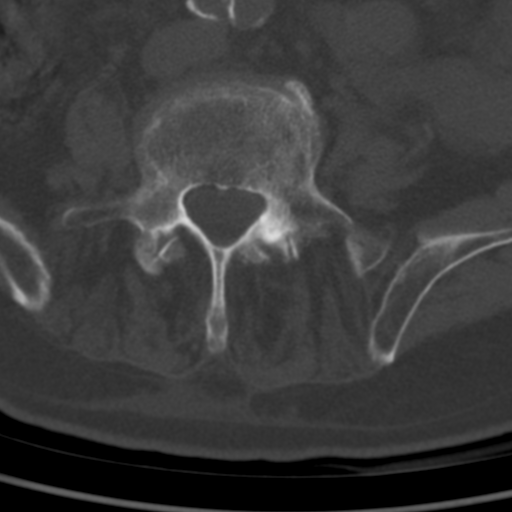
[im 35/81  bone]
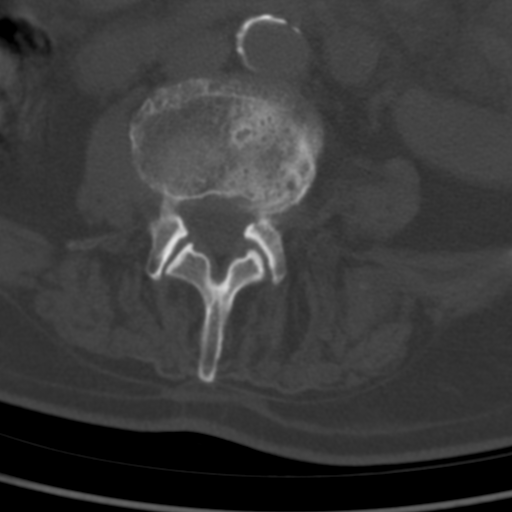
[im 46/81  bone]
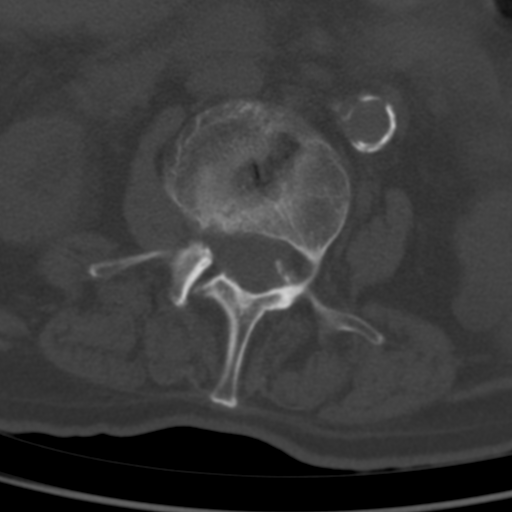
[im 58/81  soft-tissue]
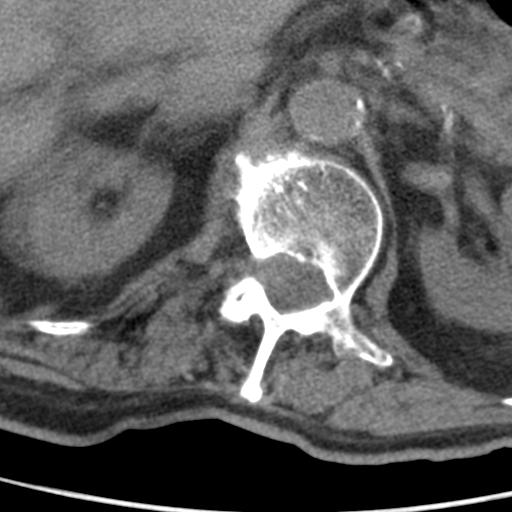
[im 58/81  bone]
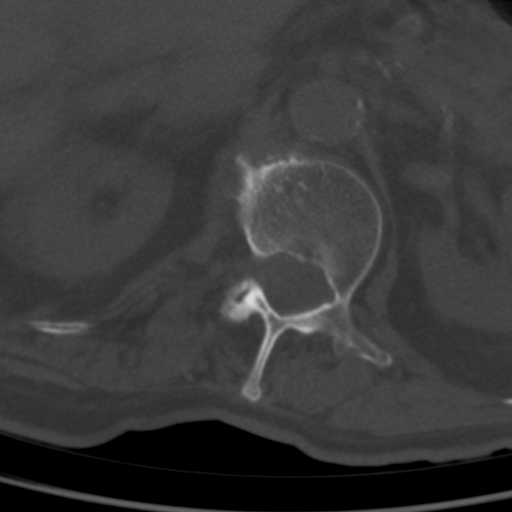
[im 69/81  bone]
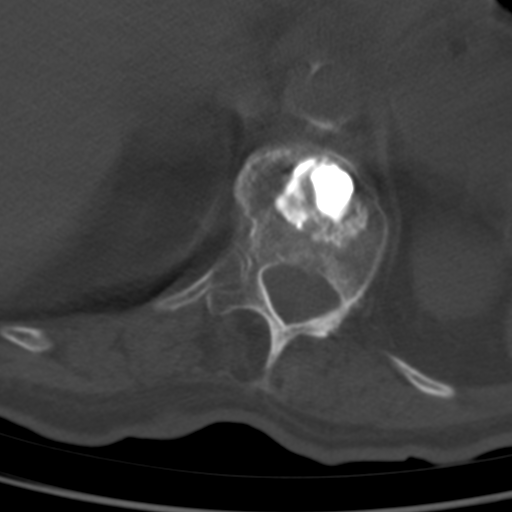

[6 of 14 positions shown; findings below may reference images not displayed]

FINDINGS: Segmentation: Standard.

Alignment: There is severe convex left scoliosis with the apex at
T12. Trace retrolisthesis L3 on L4 is identified.

Vertebrae: The patient is status post T12 vertebral augmentation.
There is a superior endplate compression fracture of L2 with
vertebral body height loss of approximately 40%. The fracture
appears remote. Degenerative endplate sclerosis is most notable at
L3-4 and L4-5.

Paraspinal and other soft tissues: Atherosclerotic vascular disease
is seen. No acute finding.

Disc levels: T12-L1: There is loss of disc space height and right
paravertebral spur. Mild facet degenerative disease. No stenosis.

L1-2: Shallow right paracentral protrusion is identified. Right
greater than left facet arthropathy. No stenosis.

L2-3: Loss of disc space height and vacuum disc phenomenon. Shallow
bulge and endplate spurring. No stenosis.

L3-4: Loss of disc space height and endplate sclerosis. Vacuum disc
phenomenon. There is a shallow bulge and endplate spur. Mild central
canal and right foraminal narrowing.

L4-5: Loss of disc space height with endplate sclerosis, vacuum disc
phenomenon and a shallow bulge more prominent to the left. The
central canal and foramina appear open.

L5-S1: Vacuum disc phenomenon and a broad-based bulge. There is mild
central canal stenosis with narrowing in the subarticular recesses
and foramina which could impact the exiting L5 and descending S1
roots.
IMPRESSION: No acute finding.

Remote T12 and L2 compression fractures.

Severe convex left scoliosis.

Multilevel degenerative disease as detailed above appears worst at
L5-S1 where there is narrowing in the subarticular recesses and
foramina which could impact the exiting L5 and descending S1 roots.

Atherosclerosis.

## 2019-11-21 ENCOUNTER — Other Ambulatory Visit: Payer: Self-pay | Admitting: Neurology

## 2019-11-21 DIAGNOSIS — I480 Paroxysmal atrial fibrillation: Secondary | ICD-10-CM | POA: Diagnosis not present

## 2019-11-21 DIAGNOSIS — M9702XD Periprosthetic fracture around internal prosthetic left hip joint, subsequent encounter: Secondary | ICD-10-CM | POA: Diagnosis not present

## 2019-11-21 DIAGNOSIS — G20 Parkinson's disease: Secondary | ICD-10-CM | POA: Diagnosis not present

## 2019-11-21 DIAGNOSIS — F329 Major depressive disorder, single episode, unspecified: Secondary | ICD-10-CM | POA: Diagnosis not present

## 2019-11-21 DIAGNOSIS — K625 Hemorrhage of anus and rectum: Secondary | ICD-10-CM | POA: Diagnosis not present

## 2019-11-21 DIAGNOSIS — S7222XD Displaced subtrochanteric fracture of left femur, subsequent encounter for closed fracture with routine healing: Secondary | ICD-10-CM | POA: Diagnosis not present

## 2019-11-22 DIAGNOSIS — K4090 Unilateral inguinal hernia, without obstruction or gangrene, not specified as recurrent: Secondary | ICD-10-CM | POA: Diagnosis not present

## 2019-11-22 DIAGNOSIS — R2689 Other abnormalities of gait and mobility: Secondary | ICD-10-CM | POA: Diagnosis not present

## 2019-11-22 DIAGNOSIS — M1990 Unspecified osteoarthritis, unspecified site: Secondary | ICD-10-CM | POA: Diagnosis not present

## 2019-11-22 DIAGNOSIS — G20 Parkinson's disease: Secondary | ICD-10-CM | POA: Diagnosis not present

## 2019-11-22 DIAGNOSIS — Z1331 Encounter for screening for depression: Secondary | ICD-10-CM | POA: Diagnosis not present

## 2019-11-22 DIAGNOSIS — I480 Paroxysmal atrial fibrillation: Secondary | ICD-10-CM | POA: Diagnosis not present

## 2019-11-22 DIAGNOSIS — R7301 Impaired fasting glucose: Secondary | ICD-10-CM | POA: Diagnosis not present

## 2019-11-26 DIAGNOSIS — S7222XD Displaced subtrochanteric fracture of left femur, subsequent encounter for closed fracture with routine healing: Secondary | ICD-10-CM | POA: Diagnosis not present

## 2019-11-26 DIAGNOSIS — Z7901 Long term (current) use of anticoagulants: Secondary | ICD-10-CM | POA: Diagnosis not present

## 2019-11-26 DIAGNOSIS — Z9181 History of falling: Secondary | ICD-10-CM | POA: Diagnosis not present

## 2019-11-26 DIAGNOSIS — M1990 Unspecified osteoarthritis, unspecified site: Secondary | ICD-10-CM | POA: Diagnosis not present

## 2019-11-26 DIAGNOSIS — F329 Major depressive disorder, single episode, unspecified: Secondary | ICD-10-CM | POA: Diagnosis not present

## 2019-11-26 DIAGNOSIS — F419 Anxiety disorder, unspecified: Secondary | ICD-10-CM | POA: Diagnosis not present

## 2019-11-26 DIAGNOSIS — G20 Parkinson's disease: Secondary | ICD-10-CM | POA: Diagnosis not present

## 2019-11-26 DIAGNOSIS — E039 Hypothyroidism, unspecified: Secondary | ICD-10-CM | POA: Diagnosis not present

## 2019-11-26 DIAGNOSIS — M9702XD Periprosthetic fracture around internal prosthetic left hip joint, subsequent encounter: Secondary | ICD-10-CM | POA: Diagnosis not present

## 2019-11-26 DIAGNOSIS — Z79891 Long term (current) use of opiate analgesic: Secondary | ICD-10-CM | POA: Diagnosis not present

## 2019-11-26 DIAGNOSIS — Z993 Dependence on wheelchair: Secondary | ICD-10-CM | POA: Diagnosis not present

## 2019-11-26 DIAGNOSIS — I480 Paroxysmal atrial fibrillation: Secondary | ICD-10-CM | POA: Diagnosis not present

## 2019-11-26 DIAGNOSIS — K625 Hemorrhage of anus and rectum: Secondary | ICD-10-CM | POA: Diagnosis not present

## 2019-11-26 DIAGNOSIS — Z7982 Long term (current) use of aspirin: Secondary | ICD-10-CM | POA: Diagnosis not present

## 2019-11-28 NOTE — Progress Notes (Signed)
Virtual Visit Via Video   The purpose of this virtual visit is to provide medical care while limiting exposure to the novel coronavirus.    Consent was obtained for video visit:  Yes.   Answered questions that patient had about telehealth interaction:  Yes.   I discussed the limitations, risks, security and privacy concerns of performing an evaluation and management service by telemedicine. I also discussed with the patient that there may be a patient responsible charge related to this service. The patient expressed understanding and agreed to proceed.  Pt location: Home Physician Location: office Name of referring provider:  Burnard Bunting, MD I connected with Algis Greenhouse at patients initiation/request on 11/29/2019 at  9:45 AM EDT by video enabled telemedicine application and verified that I am speaking with the correct person using two identifiers. [name]:  [id number] [name]:  [birth year] Video Participants:  Algis Greenhouse;  Caregiver supplements hx   Assessment/Plan:   1.  Parkinson's disease  -Continue carbidopa/levodopa 25/100, 1.5 tablets 4 times per day  -Continue carbidopa/levodopa 25/100 CR at bedtime  -Patient has 24 hour/day care.  2.  Patient needs in person follow-up next visit.  Pt agreeable.  See in 6 months.   Subjective:  Patient seen today in follow-up for Parkinson's disease.  Medical records have been reviewed since our last visit.  Patient was admitted to the hospital in February after a fall that resulted in a proximal femur fracture on the left, requiring surgical intervention (he was backing up in the kitchen).  He is walking with a walker now. Patient was readmitted to the hospital on March 23 with rectal bleeding.  Pt has occasional lightheadedness when first gets up but that isn't common.  No hallucinations.  Mood has been good.  Physical therapy was ordered last visit.  He has 24/day caregiving in the home.  PT comes 2 days per week.    Current Movement  d/o meds: Carbidopa/levodopa 25/100, 1.5 tablets 4 times per day Carbidopa/levodopa 25/100 CR at bedtime   Objective:   Vitals:   11/29/19 0903  Weight: 165 lb (74.8 kg)  Height: 5\' 10"  (1.778 m)    GEN:  The patient appears stated age and is in NAD. HEENT:  Normocephalic, atraumatic.    Neurological examination:  Orientation: The patient is alert and oriented x3. Cranial nerves: There is good facial symmetry. The speech is fluent and clear. Soft palate rises symmetrically and there is no tongue deviation. Hearing is intact to conversational tone. Motor: Strength is at least antigravity x4.  Movement examination: Tone: unable Abnormal movements: none Coordination:  Hands very arthritic so unable to tell RAMs/decremation Gait and Station: The patient ambulates very well with his walker.  No shuffling.     Follow Up Instructions:  Discussed with patient and caregiver that I would really like to see him back in the office.  I have not seen him in well over a year for care.  Discussed with patient that phone visits are really not good care and we actually called him prior to the visit today to see him in person, but that was declined.  Fortunately, at the visit time they were able to change him from a phone visit to a video visit as the caregiver had video capability.      -I discussed the assessment and treatment plan with the patient. The patient was provided an opportunity to ask questions and all were answered. The patient agreed with the plan  and demonstrated an understanding of the instructions.   The patient was advised to call back or seek an in-person evaluation if the symptoms worsen or if the condition fails to improve as anticipated.   Total time spent on today's visit was 25 minutes, including both face-to-face time and nonface-to-face time.  Time included that spent on review of records (prior notes available to me/labs/imaging if pertinent), discussing treatment and  goals, answering patient's questions and coordinating care.    Alonza Bogus, DO

## 2019-11-29 ENCOUNTER — Telehealth (INDEPENDENT_AMBULATORY_CARE_PROVIDER_SITE_OTHER): Payer: Medicare Other | Admitting: Neurology

## 2019-11-29 ENCOUNTER — Encounter: Payer: Self-pay | Admitting: Neurology

## 2019-11-29 ENCOUNTER — Other Ambulatory Visit: Payer: Self-pay

## 2019-11-29 VITALS — Ht 70.0 in | Wt 165.0 lb

## 2019-11-29 DIAGNOSIS — G20 Parkinson's disease: Secondary | ICD-10-CM | POA: Diagnosis not present

## 2019-12-01 DIAGNOSIS — G20 Parkinson's disease: Secondary | ICD-10-CM | POA: Diagnosis not present

## 2019-12-01 DIAGNOSIS — F329 Major depressive disorder, single episode, unspecified: Secondary | ICD-10-CM | POA: Diagnosis not present

## 2019-12-01 DIAGNOSIS — M9702XD Periprosthetic fracture around internal prosthetic left hip joint, subsequent encounter: Secondary | ICD-10-CM | POA: Diagnosis not present

## 2019-12-01 DIAGNOSIS — I480 Paroxysmal atrial fibrillation: Secondary | ICD-10-CM | POA: Diagnosis not present

## 2019-12-01 DIAGNOSIS — K625 Hemorrhage of anus and rectum: Secondary | ICD-10-CM | POA: Diagnosis not present

## 2019-12-01 DIAGNOSIS — S7222XD Displaced subtrochanteric fracture of left femur, subsequent encounter for closed fracture with routine healing: Secondary | ICD-10-CM | POA: Diagnosis not present

## 2019-12-04 DIAGNOSIS — S7222XD Displaced subtrochanteric fracture of left femur, subsequent encounter for closed fracture with routine healing: Secondary | ICD-10-CM | POA: Diagnosis not present

## 2019-12-04 DIAGNOSIS — I480 Paroxysmal atrial fibrillation: Secondary | ICD-10-CM | POA: Diagnosis not present

## 2019-12-04 DIAGNOSIS — M9702XD Periprosthetic fracture around internal prosthetic left hip joint, subsequent encounter: Secondary | ICD-10-CM | POA: Diagnosis not present

## 2019-12-04 DIAGNOSIS — F329 Major depressive disorder, single episode, unspecified: Secondary | ICD-10-CM | POA: Diagnosis not present

## 2019-12-04 DIAGNOSIS — G20 Parkinson's disease: Secondary | ICD-10-CM | POA: Diagnosis not present

## 2019-12-04 DIAGNOSIS — K625 Hemorrhage of anus and rectum: Secondary | ICD-10-CM | POA: Diagnosis not present

## 2019-12-11 DIAGNOSIS — S7222XD Displaced subtrochanteric fracture of left femur, subsequent encounter for closed fracture with routine healing: Secondary | ICD-10-CM | POA: Diagnosis not present

## 2019-12-11 DIAGNOSIS — I480 Paroxysmal atrial fibrillation: Secondary | ICD-10-CM | POA: Diagnosis not present

## 2019-12-11 DIAGNOSIS — M9702XD Periprosthetic fracture around internal prosthetic left hip joint, subsequent encounter: Secondary | ICD-10-CM | POA: Diagnosis not present

## 2019-12-11 DIAGNOSIS — K625 Hemorrhage of anus and rectum: Secondary | ICD-10-CM | POA: Diagnosis not present

## 2019-12-11 DIAGNOSIS — F329 Major depressive disorder, single episode, unspecified: Secondary | ICD-10-CM | POA: Diagnosis not present

## 2019-12-11 DIAGNOSIS — G20 Parkinson's disease: Secondary | ICD-10-CM | POA: Diagnosis not present

## 2019-12-19 DIAGNOSIS — S7222XD Displaced subtrochanteric fracture of left femur, subsequent encounter for closed fracture with routine healing: Secondary | ICD-10-CM | POA: Diagnosis not present

## 2019-12-19 DIAGNOSIS — G20 Parkinson's disease: Secondary | ICD-10-CM | POA: Diagnosis not present

## 2019-12-19 DIAGNOSIS — I480 Paroxysmal atrial fibrillation: Secondary | ICD-10-CM | POA: Diagnosis not present

## 2019-12-19 DIAGNOSIS — M9702XD Periprosthetic fracture around internal prosthetic left hip joint, subsequent encounter: Secondary | ICD-10-CM | POA: Diagnosis not present

## 2019-12-19 DIAGNOSIS — F329 Major depressive disorder, single episode, unspecified: Secondary | ICD-10-CM | POA: Diagnosis not present

## 2019-12-19 DIAGNOSIS — K625 Hemorrhage of anus and rectum: Secondary | ICD-10-CM | POA: Diagnosis not present

## 2019-12-26 DIAGNOSIS — G20 Parkinson's disease: Secondary | ICD-10-CM | POA: Diagnosis not present

## 2019-12-26 DIAGNOSIS — Z7982 Long term (current) use of aspirin: Secondary | ICD-10-CM | POA: Diagnosis not present

## 2019-12-26 DIAGNOSIS — Z7901 Long term (current) use of anticoagulants: Secondary | ICD-10-CM | POA: Diagnosis not present

## 2019-12-26 DIAGNOSIS — M9702XD Periprosthetic fracture around internal prosthetic left hip joint, subsequent encounter: Secondary | ICD-10-CM | POA: Diagnosis not present

## 2019-12-26 DIAGNOSIS — E039 Hypothyroidism, unspecified: Secondary | ICD-10-CM | POA: Diagnosis not present

## 2019-12-26 DIAGNOSIS — I480 Paroxysmal atrial fibrillation: Secondary | ICD-10-CM | POA: Diagnosis not present

## 2019-12-26 DIAGNOSIS — K625 Hemorrhage of anus and rectum: Secondary | ICD-10-CM | POA: Diagnosis not present

## 2019-12-26 DIAGNOSIS — S7222XD Displaced subtrochanteric fracture of left femur, subsequent encounter for closed fracture with routine healing: Secondary | ICD-10-CM | POA: Diagnosis not present

## 2019-12-26 DIAGNOSIS — Z79891 Long term (current) use of opiate analgesic: Secondary | ICD-10-CM | POA: Diagnosis not present

## 2019-12-26 DIAGNOSIS — F329 Major depressive disorder, single episode, unspecified: Secondary | ICD-10-CM | POA: Diagnosis not present

## 2019-12-26 DIAGNOSIS — M1990 Unspecified osteoarthritis, unspecified site: Secondary | ICD-10-CM | POA: Diagnosis not present

## 2019-12-26 DIAGNOSIS — Z9181 History of falling: Secondary | ICD-10-CM | POA: Diagnosis not present

## 2019-12-26 DIAGNOSIS — Z993 Dependence on wheelchair: Secondary | ICD-10-CM | POA: Diagnosis not present

## 2019-12-26 DIAGNOSIS — F419 Anxiety disorder, unspecified: Secondary | ICD-10-CM | POA: Diagnosis not present

## 2020-01-04 DIAGNOSIS — K625 Hemorrhage of anus and rectum: Secondary | ICD-10-CM | POA: Diagnosis not present

## 2020-01-04 DIAGNOSIS — F329 Major depressive disorder, single episode, unspecified: Secondary | ICD-10-CM | POA: Diagnosis not present

## 2020-01-04 DIAGNOSIS — S7222XD Displaced subtrochanteric fracture of left femur, subsequent encounter for closed fracture with routine healing: Secondary | ICD-10-CM | POA: Diagnosis not present

## 2020-01-04 DIAGNOSIS — G20 Parkinson's disease: Secondary | ICD-10-CM | POA: Diagnosis not present

## 2020-01-04 DIAGNOSIS — I480 Paroxysmal atrial fibrillation: Secondary | ICD-10-CM | POA: Diagnosis not present

## 2020-01-04 DIAGNOSIS — M9702XD Periprosthetic fracture around internal prosthetic left hip joint, subsequent encounter: Secondary | ICD-10-CM | POA: Diagnosis not present

## 2020-01-15 DIAGNOSIS — G20 Parkinson's disease: Secondary | ICD-10-CM | POA: Diagnosis not present

## 2020-01-15 DIAGNOSIS — M9702XD Periprosthetic fracture around internal prosthetic left hip joint, subsequent encounter: Secondary | ICD-10-CM | POA: Diagnosis not present

## 2020-01-15 DIAGNOSIS — F329 Major depressive disorder, single episode, unspecified: Secondary | ICD-10-CM | POA: Diagnosis not present

## 2020-01-15 DIAGNOSIS — K625 Hemorrhage of anus and rectum: Secondary | ICD-10-CM | POA: Diagnosis not present

## 2020-01-15 DIAGNOSIS — I480 Paroxysmal atrial fibrillation: Secondary | ICD-10-CM | POA: Diagnosis not present

## 2020-01-15 DIAGNOSIS — S7222XD Displaced subtrochanteric fracture of left femur, subsequent encounter for closed fracture with routine healing: Secondary | ICD-10-CM | POA: Diagnosis not present

## 2020-01-29 ENCOUNTER — Other Ambulatory Visit: Payer: Self-pay | Admitting: Neurology

## 2020-01-29 DIAGNOSIS — I10 Essential (primary) hypertension: Secondary | ICD-10-CM | POA: Diagnosis not present

## 2020-01-30 DIAGNOSIS — E079 Disorder of thyroid, unspecified: Secondary | ICD-10-CM | POA: Diagnosis not present

## 2020-01-30 DIAGNOSIS — E039 Hypothyroidism, unspecified: Secondary | ICD-10-CM | POA: Diagnosis not present

## 2020-01-30 DIAGNOSIS — Z9181 History of falling: Secondary | ICD-10-CM | POA: Diagnosis not present

## 2020-01-30 DIAGNOSIS — I480 Paroxysmal atrial fibrillation: Secondary | ICD-10-CM | POA: Diagnosis not present

## 2020-01-30 DIAGNOSIS — R0602 Shortness of breath: Secondary | ICD-10-CM | POA: Diagnosis not present

## 2020-01-30 NOTE — Telephone Encounter (Signed)
Rx(s) sent to pharmacy electronically.  

## 2020-01-31 DIAGNOSIS — I447 Left bundle-branch block, unspecified: Secondary | ICD-10-CM | POA: Diagnosis not present

## 2020-02-20 ENCOUNTER — Other Ambulatory Visit: Payer: Self-pay | Admitting: Neurology

## 2020-02-21 NOTE — Telephone Encounter (Signed)
Rx(s) sent to pharmacy electronically.  

## 2020-03-01 DIAGNOSIS — R05 Cough: Secondary | ICD-10-CM | POA: Diagnosis not present

## 2020-03-01 DIAGNOSIS — U071 COVID-19: Secondary | ICD-10-CM | POA: Diagnosis not present

## 2020-03-02 ENCOUNTER — Other Ambulatory Visit: Payer: Self-pay | Admitting: Nurse Practitioner

## 2020-03-02 DIAGNOSIS — U071 COVID-19: Secondary | ICD-10-CM

## 2020-03-02 DIAGNOSIS — I4891 Unspecified atrial fibrillation: Secondary | ICD-10-CM

## 2020-03-02 NOTE — Progress Notes (Signed)
I connected by phone with Algis Greenhouse on 03/02/2020 at 11:52 AM to discuss the potential use of a new treatment for mild to moderate COVID-19 viral infection in non-hospitalized patients.  This patient is a 86 y.o. male that meets the FDA criteria for Emergency Use Authorization of COVID monoclonal antibody casirivimab/imdevimab.  Has a (+) direct SARS-CoV-2 viral test result  Has mild or moderate COVID-19   Is NOT hospitalized due to COVID-19  Is within 10 days of symptom onset (02/27/20)  Has at least one of the high risk factor(s) for progression to severe COVID-19 and/or hospitalization as defined in EUA.  Specific high risk criteria : Older age (>/= 84 yo) and Cardiovascular disease or hypertension   I have spoken and communicated the following to the patient or parent/caregiver regarding COVID monoclonal antibody treatment:  1. FDA has authorized the emergency use for the treatment of mild to moderate COVID-19 in adults and pediatric patients with positive results of direct SARS-CoV-2 viral testing who are 66 years of age and older weighing at least 40 kg, and who are at high risk for progressing to severe COVID-19 and/or hospitalization.  2. The significant known and potential risks and benefits of COVID monoclonal antibody, and the extent to which such potential risks and benefits are unknown.  3. Information on available alternative treatments and the risks and benefits of those alternatives, including clinical trials.  4. Patients treated with COVID monoclonal antibody should continue to self-isolate and use infection control measures (e.g., wear mask, isolate, social distance, avoid sharing personal items, clean and disinfect "high touch" surfaces, and frequent handwashing) according to CDC guidelines.   5. The patient or parent/caregiver has the option to accept or refuse COVID monoclonal antibody treatment.  After reviewing this information with the patient, The patient  agreed to proceed with receiving casirivimab\imdevimab infusion and will be provided a copy of the Fact sheet prior to receiving the infusion. Chesley Noon Pickenpack-Cousar 03/02/2020 11:52 AM

## 2020-03-03 ENCOUNTER — Ambulatory Visit (HOSPITAL_COMMUNITY)
Admission: RE | Admit: 2020-03-03 | Discharge: 2020-03-03 | Disposition: A | Discharge status: Home / Self Care | Payer: Medicare Other | Source: Ambulatory Visit | Attending: Pulmonary Disease | Admitting: Pulmonary Disease

## 2020-03-03 DIAGNOSIS — J9601 Acute respiratory failure with hypoxia: Secondary | ICD-10-CM | POA: Diagnosis not present

## 2020-03-03 DIAGNOSIS — J1282 Pneumonia due to coronavirus disease 2019: Secondary | ICD-10-CM | POA: Diagnosis not present

## 2020-03-03 DIAGNOSIS — Z66 Do not resuscitate: Secondary | ICD-10-CM | POA: Diagnosis not present

## 2020-03-03 DIAGNOSIS — Z23 Encounter for immunization: Secondary | ICD-10-CM | POA: Insufficient documentation

## 2020-03-03 DIAGNOSIS — I4891 Unspecified atrial fibrillation: Secondary | ICD-10-CM

## 2020-03-03 DIAGNOSIS — R0602 Shortness of breath: Secondary | ICD-10-CM | POA: Diagnosis not present

## 2020-03-03 DIAGNOSIS — R0902 Hypoxemia: Secondary | ICD-10-CM | POA: Diagnosis not present

## 2020-03-03 DIAGNOSIS — U071 COVID-19: Secondary | ICD-10-CM | POA: Diagnosis not present

## 2020-03-03 DIAGNOSIS — J80 Acute respiratory distress syndrome: Secondary | ICD-10-CM | POA: Diagnosis not present

## 2020-03-03 DIAGNOSIS — I959 Hypotension, unspecified: Secondary | ICD-10-CM | POA: Diagnosis not present

## 2020-03-03 DIAGNOSIS — I10 Essential (primary) hypertension: Secondary | ICD-10-CM | POA: Diagnosis not present

## 2020-03-03 DIAGNOSIS — I21A1 Myocardial infarction type 2: Secondary | ICD-10-CM | POA: Diagnosis not present

## 2020-03-03 DIAGNOSIS — A4189 Other specified sepsis: Secondary | ICD-10-CM | POA: Diagnosis not present

## 2020-03-03 MED ORDER — EPINEPHRINE 0.3 MG/0.3ML IJ SOAJ: 0.3000 mg | Freq: Once | INTRAMUSCULAR | Status: DC | PRN

## 2020-03-03 MED ORDER — SODIUM CHLORIDE 0.9 % IV SOLN
1200.0000 mg | Freq: Once | INTRAVENOUS | Status: AC
  Administered 2020-03-03: 1200 mg via INTRAVENOUS

## 2020-03-03 MED ORDER — DIPHENHYDRAMINE HCL 50 MG/ML IJ SOLN: 50.0000 mg | Freq: Once | INTRAMUSCULAR | Status: DC | PRN

## 2020-03-03 MED ORDER — SODIUM CHLORIDE 0.9 % IV SOLN: INTRAVENOUS | Status: DC | PRN

## 2020-03-03 MED ORDER — ALBUTEROL SULFATE HFA 108 (90 BASE) MCG/ACT IN AERS: 2.0000 | INHALATION_SPRAY | Freq: Once | RESPIRATORY_TRACT | Status: DC | PRN

## 2020-03-03 MED ORDER — METHYLPREDNISOLONE SODIUM SUCC 125 MG IJ SOLR: 125.0000 mg | Freq: Once | INTRAMUSCULAR | Status: DC | PRN

## 2020-03-03 MED ORDER — FAMOTIDINE IN NACL 20-0.9 MG/50ML-% IV SOLN: 20.0000 mg | Freq: Once | INTRAVENOUS | Status: DC | PRN

## 2020-03-03 NOTE — Progress Notes (Signed)
  Diagnosis: COVID-19  Physician: Dr. Asencion Noble  Procedure: [facility] Med: casirivimab\imdevimab infusion - Provided patient with casirivimab\imdevimab fact sheet for patients, parents and caregivers prior to infusion.  Complications: No immediate complications noted.  Discharge: Discharged home   [place] 03/03/2020

## 2020-03-03 NOTE — Discharge Instructions (Signed)

## 2020-03-04 DIAGNOSIS — S22089A Unspecified fracture of T11-T12 vertebra, initial encounter for closed fracture: Secondary | ICD-10-CM | POA: Diagnosis not present

## 2020-03-04 DIAGNOSIS — M2550 Pain in unspecified joint: Secondary | ICD-10-CM | POA: Diagnosis not present

## 2020-03-04 DIAGNOSIS — J1282 Pneumonia due to coronavirus disease 2019: Secondary | ICD-10-CM | POA: Diagnosis not present

## 2020-03-04 DIAGNOSIS — A4189 Other specified sepsis: Secondary | ICD-10-CM | POA: Diagnosis not present

## 2020-03-04 DIAGNOSIS — R410 Disorientation, unspecified: Secondary | ICD-10-CM | POA: Diagnosis not present

## 2020-03-04 DIAGNOSIS — R6520 Severe sepsis without septic shock: Secondary | ICD-10-CM | POA: Diagnosis present

## 2020-03-04 DIAGNOSIS — G20 Parkinson's disease: Secondary | ICD-10-CM | POA: Diagnosis not present

## 2020-03-04 DIAGNOSIS — Z66 Do not resuscitate: Secondary | ICD-10-CM | POA: Diagnosis not present

## 2020-03-04 DIAGNOSIS — I499 Cardiac arrhythmia, unspecified: Secondary | ICD-10-CM | POA: Diagnosis not present

## 2020-03-04 DIAGNOSIS — N179 Acute kidney failure, unspecified: Secondary | ICD-10-CM | POA: Diagnosis not present

## 2020-03-04 DIAGNOSIS — J189 Pneumonia, unspecified organism: Secondary | ICD-10-CM | POA: Diagnosis not present

## 2020-03-04 DIAGNOSIS — R4182 Altered mental status, unspecified: Secondary | ICD-10-CM | POA: Diagnosis not present

## 2020-03-04 DIAGNOSIS — J9601 Acute respiratory failure with hypoxia: Secondary | ICD-10-CM | POA: Diagnosis not present

## 2020-03-04 DIAGNOSIS — R0902 Hypoxemia: Secondary | ICD-10-CM | POA: Diagnosis not present

## 2020-03-04 DIAGNOSIS — I447 Left bundle-branch block, unspecified: Secondary | ICD-10-CM | POA: Diagnosis not present

## 2020-03-04 DIAGNOSIS — Z515 Encounter for palliative care: Secondary | ICD-10-CM | POA: Diagnosis not present

## 2020-03-04 DIAGNOSIS — R0602 Shortness of breath: Secondary | ICD-10-CM | POA: Diagnosis not present

## 2020-03-04 DIAGNOSIS — K219 Gastro-esophageal reflux disease without esophagitis: Secondary | ICD-10-CM | POA: Diagnosis present

## 2020-03-04 DIAGNOSIS — I480 Paroxysmal atrial fibrillation: Secondary | ICD-10-CM | POA: Diagnosis present

## 2020-03-04 DIAGNOSIS — J432 Centrilobular emphysema: Secondary | ICD-10-CM | POA: Diagnosis not present

## 2020-03-04 DIAGNOSIS — I2510 Atherosclerotic heart disease of native coronary artery without angina pectoris: Secondary | ICD-10-CM | POA: Diagnosis not present

## 2020-03-04 DIAGNOSIS — I959 Hypotension, unspecified: Secondary | ICD-10-CM | POA: Diagnosis not present

## 2020-03-04 DIAGNOSIS — E039 Hypothyroidism, unspecified: Secondary | ICD-10-CM | POA: Diagnosis present

## 2020-03-04 DIAGNOSIS — U071 COVID-19: Secondary | ICD-10-CM | POA: Diagnosis not present

## 2020-03-04 DIAGNOSIS — I21A1 Myocardial infarction type 2: Secondary | ICD-10-CM | POA: Diagnosis not present

## 2020-03-04 DIAGNOSIS — Z7401 Bed confinement status: Secondary | ICD-10-CM | POA: Diagnosis not present

## 2020-03-04 DIAGNOSIS — F329 Major depressive disorder, single episode, unspecified: Secondary | ICD-10-CM | POA: Diagnosis present

## 2020-03-04 DIAGNOSIS — J90 Pleural effusion, not elsewhere classified: Secondary | ICD-10-CM | POA: Diagnosis not present

## 2020-03-04 DIAGNOSIS — I700 Atherosclerosis of aorta: Secondary | ICD-10-CM | POA: Diagnosis not present

## 2020-03-04 DIAGNOSIS — F419 Anxiety disorder, unspecified: Secondary | ICD-10-CM | POA: Diagnosis present

## 2020-03-04 DIAGNOSIS — Z9981 Dependence on supplemental oxygen: Secondary | ICD-10-CM | POA: Diagnosis not present

## 2020-03-15 DIAGNOSIS — F419 Anxiety disorder, unspecified: Secondary | ICD-10-CM | POA: Diagnosis not present

## 2020-03-15 DIAGNOSIS — E039 Hypothyroidism, unspecified: Secondary | ICD-10-CM | POA: Diagnosis not present

## 2020-03-15 DIAGNOSIS — U071 COVID-19: Secondary | ICD-10-CM | POA: Diagnosis not present

## 2020-03-15 DIAGNOSIS — I700 Atherosclerosis of aorta: Secondary | ICD-10-CM | POA: Diagnosis not present

## 2020-03-15 DIAGNOSIS — J1282 Pneumonia due to coronavirus disease 2019: Secondary | ICD-10-CM | POA: Diagnosis not present

## 2020-03-15 DIAGNOSIS — M1990 Unspecified osteoarthritis, unspecified site: Secondary | ICD-10-CM | POA: Diagnosis not present

## 2020-03-15 DIAGNOSIS — G20 Parkinson's disease: Secondary | ICD-10-CM | POA: Diagnosis not present

## 2020-03-15 DIAGNOSIS — Z993 Dependence on wheelchair: Secondary | ICD-10-CM | POA: Diagnosis not present

## 2020-03-15 DIAGNOSIS — Z9981 Dependence on supplemental oxygen: Secondary | ICD-10-CM | POA: Diagnosis not present

## 2020-03-15 DIAGNOSIS — I4891 Unspecified atrial fibrillation: Secondary | ICD-10-CM | POA: Diagnosis not present

## 2020-03-15 DIAGNOSIS — Z8546 Personal history of malignant neoplasm of prostate: Secondary | ICD-10-CM | POA: Diagnosis not present

## 2020-03-15 DIAGNOSIS — K219 Gastro-esophageal reflux disease without esophagitis: Secondary | ICD-10-CM | POA: Diagnosis not present

## 2020-03-15 DIAGNOSIS — Z8781 Personal history of (healed) traumatic fracture: Secondary | ICD-10-CM | POA: Diagnosis not present

## 2020-03-15 DIAGNOSIS — R32 Unspecified urinary incontinence: Secondary | ICD-10-CM | POA: Diagnosis not present

## 2020-03-15 DIAGNOSIS — I21A1 Myocardial infarction type 2: Secondary | ICD-10-CM | POA: Diagnosis not present

## 2020-03-15 DIAGNOSIS — Z7951 Long term (current) use of inhaled steroids: Secondary | ICD-10-CM | POA: Diagnosis not present

## 2020-03-15 DIAGNOSIS — F329 Major depressive disorder, single episode, unspecified: Secondary | ICD-10-CM | POA: Diagnosis not present

## 2020-03-15 DIAGNOSIS — J432 Centrilobular emphysema: Secondary | ICD-10-CM | POA: Diagnosis not present

## 2020-03-15 DIAGNOSIS — J9601 Acute respiratory failure with hypoxia: Secondary | ICD-10-CM | POA: Diagnosis not present

## 2020-03-15 DIAGNOSIS — Z9181 History of falling: Secondary | ICD-10-CM | POA: Diagnosis not present

## 2020-03-15 DIAGNOSIS — Z7982 Long term (current) use of aspirin: Secondary | ICD-10-CM | POA: Diagnosis not present

## 2020-03-15 DIAGNOSIS — Z7409 Other reduced mobility: Secondary | ICD-10-CM | POA: Diagnosis not present

## 2020-03-16 DIAGNOSIS — U071 COVID-19: Secondary | ICD-10-CM | POA: Diagnosis not present

## 2020-03-16 DIAGNOSIS — I21A1 Myocardial infarction type 2: Secondary | ICD-10-CM | POA: Diagnosis not present

## 2020-03-16 DIAGNOSIS — J9601 Acute respiratory failure with hypoxia: Secondary | ICD-10-CM | POA: Diagnosis not present

## 2020-03-16 DIAGNOSIS — J432 Centrilobular emphysema: Secondary | ICD-10-CM | POA: Diagnosis not present

## 2020-03-16 DIAGNOSIS — J1282 Pneumonia due to coronavirus disease 2019: Secondary | ICD-10-CM | POA: Diagnosis not present

## 2020-03-16 DIAGNOSIS — G20 Parkinson's disease: Secondary | ICD-10-CM | POA: Diagnosis not present

## 2020-03-17 DIAGNOSIS — J9601 Acute respiratory failure with hypoxia: Secondary | ICD-10-CM | POA: Diagnosis not present

## 2020-03-17 DIAGNOSIS — Z7409 Other reduced mobility: Secondary | ICD-10-CM | POA: Diagnosis not present

## 2020-03-17 DIAGNOSIS — U071 COVID-19: Secondary | ICD-10-CM | POA: Diagnosis not present

## 2020-03-17 DIAGNOSIS — J1282 Pneumonia due to coronavirus disease 2019: Secondary | ICD-10-CM | POA: Diagnosis not present

## 2020-03-19 DIAGNOSIS — G20 Parkinson's disease: Secondary | ICD-10-CM | POA: Diagnosis not present

## 2020-03-19 DIAGNOSIS — J432 Centrilobular emphysema: Secondary | ICD-10-CM | POA: Diagnosis not present

## 2020-03-19 DIAGNOSIS — U071 COVID-19: Secondary | ICD-10-CM | POA: Diagnosis not present

## 2020-03-19 DIAGNOSIS — J1282 Pneumonia due to coronavirus disease 2019: Secondary | ICD-10-CM | POA: Diagnosis not present

## 2020-03-19 DIAGNOSIS — J9601 Acute respiratory failure with hypoxia: Secondary | ICD-10-CM | POA: Diagnosis not present

## 2020-03-19 DIAGNOSIS — I21A1 Myocardial infarction type 2: Secondary | ICD-10-CM | POA: Diagnosis not present

## 2020-03-22 DIAGNOSIS — J1282 Pneumonia due to coronavirus disease 2019: Secondary | ICD-10-CM | POA: Diagnosis not present

## 2020-03-22 DIAGNOSIS — J9601 Acute respiratory failure with hypoxia: Secondary | ICD-10-CM | POA: Diagnosis not present

## 2020-03-22 DIAGNOSIS — J432 Centrilobular emphysema: Secondary | ICD-10-CM | POA: Diagnosis not present

## 2020-03-22 DIAGNOSIS — U071 COVID-19: Secondary | ICD-10-CM | POA: Diagnosis not present

## 2020-03-22 DIAGNOSIS — G20 Parkinson's disease: Secondary | ICD-10-CM | POA: Diagnosis not present

## 2020-03-22 DIAGNOSIS — I21A1 Myocardial infarction type 2: Secondary | ICD-10-CM | POA: Diagnosis not present

## 2020-03-25 DIAGNOSIS — J9601 Acute respiratory failure with hypoxia: Secondary | ICD-10-CM | POA: Diagnosis not present

## 2020-03-25 DIAGNOSIS — U071 COVID-19: Secondary | ICD-10-CM | POA: Diagnosis not present

## 2020-03-25 DIAGNOSIS — I21A1 Myocardial infarction type 2: Secondary | ICD-10-CM | POA: Diagnosis not present

## 2020-03-25 DIAGNOSIS — J432 Centrilobular emphysema: Secondary | ICD-10-CM | POA: Diagnosis not present

## 2020-03-25 DIAGNOSIS — J1282 Pneumonia due to coronavirus disease 2019: Secondary | ICD-10-CM | POA: Diagnosis not present

## 2020-03-25 DIAGNOSIS — G20 Parkinson's disease: Secondary | ICD-10-CM | POA: Diagnosis not present

## 2020-03-26 DIAGNOSIS — J9601 Acute respiratory failure with hypoxia: Secondary | ICD-10-CM | POA: Diagnosis not present

## 2020-03-26 DIAGNOSIS — G20 Parkinson's disease: Secondary | ICD-10-CM | POA: Diagnosis not present

## 2020-03-26 DIAGNOSIS — J1282 Pneumonia due to coronavirus disease 2019: Secondary | ICD-10-CM | POA: Diagnosis not present

## 2020-03-26 DIAGNOSIS — I21A1 Myocardial infarction type 2: Secondary | ICD-10-CM | POA: Diagnosis not present

## 2020-03-26 DIAGNOSIS — U071 COVID-19: Secondary | ICD-10-CM | POA: Diagnosis not present

## 2020-03-26 DIAGNOSIS — J432 Centrilobular emphysema: Secondary | ICD-10-CM | POA: Diagnosis not present

## 2020-03-27 DIAGNOSIS — G20 Parkinson's disease: Secondary | ICD-10-CM | POA: Diagnosis not present

## 2020-03-27 DIAGNOSIS — J1282 Pneumonia due to coronavirus disease 2019: Secondary | ICD-10-CM | POA: Diagnosis not present

## 2020-03-27 DIAGNOSIS — J432 Centrilobular emphysema: Secondary | ICD-10-CM | POA: Diagnosis not present

## 2020-03-27 DIAGNOSIS — J9601 Acute respiratory failure with hypoxia: Secondary | ICD-10-CM | POA: Diagnosis not present

## 2020-03-27 DIAGNOSIS — U071 COVID-19: Secondary | ICD-10-CM | POA: Diagnosis not present

## 2020-03-27 DIAGNOSIS — I21A1 Myocardial infarction type 2: Secondary | ICD-10-CM | POA: Diagnosis not present

## 2020-03-28 DIAGNOSIS — I21A1 Myocardial infarction type 2: Secondary | ICD-10-CM | POA: Diagnosis not present

## 2020-03-28 DIAGNOSIS — J1282 Pneumonia due to coronavirus disease 2019: Secondary | ICD-10-CM | POA: Diagnosis not present

## 2020-03-28 DIAGNOSIS — J9601 Acute respiratory failure with hypoxia: Secondary | ICD-10-CM | POA: Diagnosis not present

## 2020-03-28 DIAGNOSIS — J432 Centrilobular emphysema: Secondary | ICD-10-CM | POA: Diagnosis not present

## 2020-03-28 DIAGNOSIS — G20 Parkinson's disease: Secondary | ICD-10-CM | POA: Diagnosis not present

## 2020-03-28 DIAGNOSIS — U071 COVID-19: Secondary | ICD-10-CM | POA: Diagnosis not present

## 2020-04-01 DIAGNOSIS — J9601 Acute respiratory failure with hypoxia: Secondary | ICD-10-CM | POA: Diagnosis not present

## 2020-04-01 DIAGNOSIS — U071 COVID-19: Secondary | ICD-10-CM | POA: Diagnosis not present

## 2020-04-01 DIAGNOSIS — J1282 Pneumonia due to coronavirus disease 2019: Secondary | ICD-10-CM | POA: Diagnosis not present

## 2020-04-01 DIAGNOSIS — G20 Parkinson's disease: Secondary | ICD-10-CM | POA: Diagnosis not present

## 2020-04-01 DIAGNOSIS — J432 Centrilobular emphysema: Secondary | ICD-10-CM | POA: Diagnosis not present

## 2020-04-01 DIAGNOSIS — I21A1 Myocardial infarction type 2: Secondary | ICD-10-CM | POA: Diagnosis not present

## 2020-04-03 DIAGNOSIS — I21A1 Myocardial infarction type 2: Secondary | ICD-10-CM | POA: Diagnosis not present

## 2020-04-03 DIAGNOSIS — J1282 Pneumonia due to coronavirus disease 2019: Secondary | ICD-10-CM | POA: Diagnosis not present

## 2020-04-03 DIAGNOSIS — G20 Parkinson's disease: Secondary | ICD-10-CM | POA: Diagnosis not present

## 2020-04-03 DIAGNOSIS — U071 COVID-19: Secondary | ICD-10-CM | POA: Diagnosis not present

## 2020-04-03 DIAGNOSIS — J432 Centrilobular emphysema: Secondary | ICD-10-CM | POA: Diagnosis not present

## 2020-04-03 DIAGNOSIS — J9601 Acute respiratory failure with hypoxia: Secondary | ICD-10-CM | POA: Diagnosis not present

## 2020-04-08 DIAGNOSIS — J9601 Acute respiratory failure with hypoxia: Secondary | ICD-10-CM | POA: Diagnosis not present

## 2020-04-08 DIAGNOSIS — I21A1 Myocardial infarction type 2: Secondary | ICD-10-CM | POA: Diagnosis not present

## 2020-04-08 DIAGNOSIS — J432 Centrilobular emphysema: Secondary | ICD-10-CM | POA: Diagnosis not present

## 2020-04-08 DIAGNOSIS — U071 COVID-19: Secondary | ICD-10-CM | POA: Diagnosis not present

## 2020-04-08 DIAGNOSIS — G20 Parkinson's disease: Secondary | ICD-10-CM | POA: Diagnosis not present

## 2020-04-08 DIAGNOSIS — J1282 Pneumonia due to coronavirus disease 2019: Secondary | ICD-10-CM | POA: Diagnosis not present

## 2020-04-09 DIAGNOSIS — J432 Centrilobular emphysema: Secondary | ICD-10-CM | POA: Diagnosis not present

## 2020-04-09 DIAGNOSIS — U071 COVID-19: Secondary | ICD-10-CM | POA: Diagnosis not present

## 2020-04-09 DIAGNOSIS — J1282 Pneumonia due to coronavirus disease 2019: Secondary | ICD-10-CM | POA: Diagnosis not present

## 2020-04-09 DIAGNOSIS — G20 Parkinson's disease: Secondary | ICD-10-CM | POA: Diagnosis not present

## 2020-04-09 DIAGNOSIS — I21A1 Myocardial infarction type 2: Secondary | ICD-10-CM | POA: Diagnosis not present

## 2020-04-09 DIAGNOSIS — J9601 Acute respiratory failure with hypoxia: Secondary | ICD-10-CM | POA: Diagnosis not present

## 2020-04-11 DIAGNOSIS — U071 COVID-19: Secondary | ICD-10-CM | POA: Diagnosis not present

## 2020-04-11 DIAGNOSIS — I21A1 Myocardial infarction type 2: Secondary | ICD-10-CM | POA: Diagnosis not present

## 2020-04-11 DIAGNOSIS — G20 Parkinson's disease: Secondary | ICD-10-CM | POA: Diagnosis not present

## 2020-04-11 DIAGNOSIS — J432 Centrilobular emphysema: Secondary | ICD-10-CM | POA: Diagnosis not present

## 2020-04-11 DIAGNOSIS — J9601 Acute respiratory failure with hypoxia: Secondary | ICD-10-CM | POA: Diagnosis not present

## 2020-04-11 DIAGNOSIS — H6123 Impacted cerumen, bilateral: Secondary | ICD-10-CM | POA: Diagnosis not present

## 2020-04-11 DIAGNOSIS — J1282 Pneumonia due to coronavirus disease 2019: Secondary | ICD-10-CM | POA: Diagnosis not present

## 2020-04-14 DIAGNOSIS — I4891 Unspecified atrial fibrillation: Secondary | ICD-10-CM | POA: Diagnosis not present

## 2020-04-14 DIAGNOSIS — R32 Unspecified urinary incontinence: Secondary | ICD-10-CM | POA: Diagnosis not present

## 2020-04-14 DIAGNOSIS — G20 Parkinson's disease: Secondary | ICD-10-CM | POA: Diagnosis not present

## 2020-04-14 DIAGNOSIS — Z993 Dependence on wheelchair: Secondary | ICD-10-CM | POA: Diagnosis not present

## 2020-04-14 DIAGNOSIS — Z7982 Long term (current) use of aspirin: Secondary | ICD-10-CM | POA: Diagnosis not present

## 2020-04-14 DIAGNOSIS — I700 Atherosclerosis of aorta: Secondary | ICD-10-CM | POA: Diagnosis not present

## 2020-04-14 DIAGNOSIS — K219 Gastro-esophageal reflux disease without esophagitis: Secondary | ICD-10-CM | POA: Diagnosis not present

## 2020-04-14 DIAGNOSIS — Z8781 Personal history of (healed) traumatic fracture: Secondary | ICD-10-CM | POA: Diagnosis not present

## 2020-04-14 DIAGNOSIS — M1990 Unspecified osteoarthritis, unspecified site: Secondary | ICD-10-CM | POA: Diagnosis not present

## 2020-04-14 DIAGNOSIS — J432 Centrilobular emphysema: Secondary | ICD-10-CM | POA: Diagnosis not present

## 2020-04-14 DIAGNOSIS — F419 Anxiety disorder, unspecified: Secondary | ICD-10-CM | POA: Diagnosis not present

## 2020-04-14 DIAGNOSIS — Z7951 Long term (current) use of inhaled steroids: Secondary | ICD-10-CM | POA: Diagnosis not present

## 2020-04-14 DIAGNOSIS — I21A1 Myocardial infarction type 2: Secondary | ICD-10-CM | POA: Diagnosis not present

## 2020-04-14 DIAGNOSIS — Z8546 Personal history of malignant neoplasm of prostate: Secondary | ICD-10-CM | POA: Diagnosis not present

## 2020-04-14 DIAGNOSIS — Z9181 History of falling: Secondary | ICD-10-CM | POA: Diagnosis not present

## 2020-04-14 DIAGNOSIS — E039 Hypothyroidism, unspecified: Secondary | ICD-10-CM | POA: Diagnosis not present

## 2020-04-14 DIAGNOSIS — F329 Major depressive disorder, single episode, unspecified: Secondary | ICD-10-CM | POA: Diagnosis not present

## 2020-04-14 DIAGNOSIS — Z9981 Dependence on supplemental oxygen: Secondary | ICD-10-CM | POA: Diagnosis not present

## 2020-04-14 DIAGNOSIS — J1282 Pneumonia due to coronavirus disease 2019: Secondary | ICD-10-CM | POA: Diagnosis not present

## 2020-04-14 DIAGNOSIS — U071 COVID-19: Secondary | ICD-10-CM | POA: Diagnosis not present

## 2020-04-14 DIAGNOSIS — J9601 Acute respiratory failure with hypoxia: Secondary | ICD-10-CM | POA: Diagnosis not present

## 2020-04-15 DIAGNOSIS — J9601 Acute respiratory failure with hypoxia: Secondary | ICD-10-CM | POA: Diagnosis not present

## 2020-04-15 DIAGNOSIS — I21A1 Myocardial infarction type 2: Secondary | ICD-10-CM | POA: Diagnosis not present

## 2020-04-15 DIAGNOSIS — U071 COVID-19: Secondary | ICD-10-CM | POA: Diagnosis not present

## 2020-04-15 DIAGNOSIS — G20 Parkinson's disease: Secondary | ICD-10-CM | POA: Diagnosis not present

## 2020-04-15 DIAGNOSIS — J1282 Pneumonia due to coronavirus disease 2019: Secondary | ICD-10-CM | POA: Diagnosis not present

## 2020-04-15 DIAGNOSIS — J432 Centrilobular emphysema: Secondary | ICD-10-CM | POA: Diagnosis not present

## 2020-04-16 DIAGNOSIS — J432 Centrilobular emphysema: Secondary | ICD-10-CM | POA: Diagnosis not present

## 2020-04-16 DIAGNOSIS — G20 Parkinson's disease: Secondary | ICD-10-CM | POA: Diagnosis not present

## 2020-04-16 DIAGNOSIS — J1282 Pneumonia due to coronavirus disease 2019: Secondary | ICD-10-CM | POA: Diagnosis not present

## 2020-04-16 DIAGNOSIS — U071 COVID-19: Secondary | ICD-10-CM | POA: Diagnosis not present

## 2020-04-16 DIAGNOSIS — J9601 Acute respiratory failure with hypoxia: Secondary | ICD-10-CM | POA: Diagnosis not present

## 2020-04-16 DIAGNOSIS — I21A1 Myocardial infarction type 2: Secondary | ICD-10-CM | POA: Diagnosis not present

## 2020-04-17 DIAGNOSIS — E039 Hypothyroidism, unspecified: Secondary | ICD-10-CM | POA: Diagnosis not present

## 2020-04-17 DIAGNOSIS — G20 Parkinson's disease: Secondary | ICD-10-CM | POA: Diagnosis not present

## 2020-04-17 DIAGNOSIS — R062 Wheezing: Secondary | ICD-10-CM | POA: Diagnosis not present

## 2020-04-17 DIAGNOSIS — R634 Abnormal weight loss: Secondary | ICD-10-CM | POA: Diagnosis not present

## 2020-04-17 DIAGNOSIS — F419 Anxiety disorder, unspecified: Secondary | ICD-10-CM | POA: Diagnosis not present

## 2020-04-17 DIAGNOSIS — R7301 Impaired fasting glucose: Secondary | ICD-10-CM | POA: Diagnosis not present

## 2020-04-18 ENCOUNTER — Telehealth: Payer: Self-pay | Admitting: Neurology

## 2020-04-18 NOTE — Telephone Encounter (Signed)
Patient's daughter called to ask if Dr Tat would be able to see him before his 05/30/20 appt? I added him to the waitlist. Daughter states patients PCP wanted him to be seen sooner. She states he was sick recently and has recovered well but his parkinson's is much worse. Please advise. She tried to get him to come 04/19/20 @ 8:15a but he told her that was too early.

## 2020-04-19 ENCOUNTER — Ambulatory Visit: Payer: Medicare Other | Admitting: Neurology

## 2020-04-19 NOTE — Telephone Encounter (Signed)
Reviewed chart.  Contacted patients daugher contacted the office but is not on DPR and informed her that I can not speak with her. She got very upset and states she is her dad's caregiver. She states her brother is the POA. Then she calls her brother on her cell phone and tells him that someone from Dr Doristine Devoid office is going to call him and for him to answer the phone because we wont let her make an appt for their dad because she is on on the list of people I can speak with.   Informed her that I would give the POA a call. She voiced understanding and hung up.  Spoke with patients son, Nathan Stanley on [place] and is POA, he states he understands that I could not speak with his sister.(she doesn't and he has tried to explain to her multiple times but she doesn't get it) He states his dad early morning appointments do not work for his dad because his dad is hard to get moving early in the morning. He states he is ok with his dad being put on the cancellation list. He states if another early appointment becomes available to give the appointment to another patient. He states his dad likes appointments ten am or later. He thanked me for calling.

## 2020-04-19 NOTE — Telephone Encounter (Signed)
Noted.  I have actually only seen patient one time in person.  All others were phone/video visits and only caregiver with patient.  Unfortunately with cancellation list we cannot guarantee that the timing of the appt will be perfect, as we did try to get them in today and they didn't like the time of the appt.  Nathan Stanley, add to cx list for afternoons

## 2020-04-19 NOTE — Telephone Encounter (Signed)
He can be on the cancellation list (although it looks like we just tried to put him in for today and he declined the appt).  I reviewed his chart.  He had covid recently.  I don't usually change Parkinsons Disease meds after illness because it can take quite some time, especially in a 86 year old, for Parkinsons Disease to rebound from that.

## 2020-04-22 DIAGNOSIS — J9601 Acute respiratory failure with hypoxia: Secondary | ICD-10-CM | POA: Diagnosis not present

## 2020-04-22 DIAGNOSIS — J432 Centrilobular emphysema: Secondary | ICD-10-CM | POA: Diagnosis not present

## 2020-04-22 DIAGNOSIS — I21A1 Myocardial infarction type 2: Secondary | ICD-10-CM | POA: Diagnosis not present

## 2020-04-22 DIAGNOSIS — U071 COVID-19: Secondary | ICD-10-CM | POA: Diagnosis not present

## 2020-04-22 DIAGNOSIS — J1282 Pneumonia due to coronavirus disease 2019: Secondary | ICD-10-CM | POA: Diagnosis not present

## 2020-04-22 DIAGNOSIS — G20 Parkinson's disease: Secondary | ICD-10-CM | POA: Diagnosis not present

## 2020-04-23 NOTE — Progress Notes (Signed)
Assessment/Plan:   1.  Parkinsons Disease  -Discussed with patient and family that Parkinson's disease symptoms often times will respond to other illnesses. He got symptomatically worse when he got Covid. Parkinson's patients can take longer than the general population to bounce back from Covid, especially in this age group. Finishing home PT  -Clinically, I do not see that the patient really looks significantly worse than when I last saw him, and that was quite some time ago.  Patient and caregiver both agree with this.  He was actually overdue for medication, and still has some dyskinesia noted.  I would not recommend changing medication right now, and the patient and caregiver agreed with that.  -Continue carbidopa/levodopa 25/100, 1.5 tablets 4 times per day but it keep it away from protein by 30 min.  Discussed timing with routine  -Continue carbidopa/levodopa 25/100 CR at bedtime  2.  Memory change, likely PDD  -Patient with 24 hour/day caregivers in the home.  -Patient's son is healthcare power of attorney  -I discussed with patient and caregiver that patient's son would need to give [place] permission to talk to daughter.  The daughter was very frustrated when she called here the other day and we were unable to release info to her as she is not on DPR.  The caregiver filled out a new DPR today, but discussed with her that she, as a caregiver, was not able to put the daughter on the [place].  She understood that, and was going to have the son, and sign a DPR for the daughter.  My medical assistant ended up talking to the patient's son and the son stated that he did not want his sister, the patient's daughter, on the [place].  I did tell the patient and caregiver that the family is certainly welcome to attend visits.   Subjective:   COLLINS KERBY was seen today in follow up for Parkinsons disease.  My previous records were reviewed prior to todays visit as well as outside records available to me.  Pt  worked in today at request of daughter.  Neither his daughter or son come today.  Caregiver comes to the visit.   Hasn't had an in person visit since pts first visit with me in 07/2018 (remainer were phone or video with a caregiver present).     Pt had Covid in September (early September) and recovered fairly well with monoclonal antibody treatment. However, his Parkinson's did seem to get worse with the Covid.  Fell 2 days ago.  Got up from the chair and fell.  Didn't get to the walker and just fell.  Hit head but no LOC or alteration in consciousness and not acting any different since then. Caregiver states that he is having PT in the home - was twice per week and now once per week and caregiver and patient think slowly getting better.  No hallucinations.  Some confusion - "big time" per patient and sometimes per caregiver.  Appetite is fair.   Started on ativan for anxiety, 0.5 mg, in the AM and prn in the afternoon.  Current prescribed movement disorder medications: Carbidopa/levodopa 25/100, 1.5 tablets four times per day (8:30am/noon/3pm/6pm) - all of them are associated with meals or snack.   Carbidopa/levodopa 25/100 CR at bedtime   ALLERGIES:  No Known Allergies  CURRENT MEDICATIONS:  Outpatient Encounter Medications as of 04/25/2020  Medication Sig   aspirin EC 81 MG tablet Take 1 tablet (81 mg total) by mouth daily.  calcitonin, salmon, (MIACALCIN/FORTICAL) 200 UNIT/ACT nasal spray Place 1 spray into alternate nostrils daily.   calcium citrate (CALCITRATE - DOSED IN MG ELEMENTAL CALCIUM) 950 (200 Ca) MG tablet Take 200 mg of elemental calcium by mouth daily.   carbidopa-levodopa (SINEMET IR) 25-100 MG tablet TAKE 1 AND 1/2 TABLETS BY MOUTH 4 TIMES DAILY   Carbidopa-Levodopa ER (SINEMET CR) 25-100 MG tablet controlled release TAKE ONE TABLET BY MOUTH AT BEDTIME   Cholecalciferol (VITAMIN D-3) 25 MCG (1000 UT) CAPS Take 1 capsule by mouth daily.   diltiazem (DILACOR XR) 180 MG 24 hr  capsule Take 180 mg by mouth daily.   escitalopram (LEXAPRO) 10 MG tablet Take 10 mg by mouth daily.   levothyroxine (LEVOTHROID) 25 MCG tablet Take 25 mcg by mouth daily.   [DISCONTINUED] ALPRAZolam (XANAX) 0.25 MG tablet Take 0.25 mg by mouth as needed.   (Patient not taking: Reported on 04/25/2020)   [DISCONTINUED] Psyllium (METAMUCIL FIBER PO) Take 1 each by mouth 3 (three) times daily. (Patient not taking: Reported on 04/25/2020)   No facility-administered encounter medications on file as of 04/25/2020.    Objective:   PHYSICAL EXAMINATION:    VITALS:   Vitals:   04/25/20 1541  BP: (!) 114/53  Pulse: 79  SpO2: 95%  Weight: 152 lb (68.9 kg)  Height: 6' (1.829 m)    GEN:  The patient appears stated age and is in NAD. HEENT:  Normocephalic, atraumatic.  The mucous membranes are moist. The superficial temporal arteries are without ropiness or tenderness. CV:  RRR Lungs:  CTAB Neck/HEME:  There are no carotid bruits bilaterally.  Neurological examination:  Orientation: The patient is alert and oriented x 2 Cranial nerves: There is good facial symmetry with mild facial hypomimia. The speech is fluent and clear. Soft palate rises symmetrically and there is no tongue deviation. Hearing is decreased to conversational tone. Sensation: Sensation is intact to light touch throughout Motor: Strength is at least antigravity x4.  Movement examination: Tone: There is nl tone in the UE bilaterally Abnormal movements: there is L arm and leg dyskinesia Coordination:  There is no decremation with RAM's, but he has a disfigured hand on the right and cannot do RAMs on the right.   Gait and Station: The patient is able to get out of the chair by himself by pushing off, but he does retropulse backward, but is able to catch himself and stay upright.  He has a walker and is short stepped with the walker, but actually ambulates fairly well with it.   Total time spent on today's visit was 30  minutes, including both face-to-face time and nonface-to-face time.  Time included that spent on review of records (prior notes available to me/labs/imaging if pertinent), discussing treatment and goals, answering patient's questions and coordinating care.  Cc:  Burnard Bunting, MD

## 2020-04-24 DIAGNOSIS — J432 Centrilobular emphysema: Secondary | ICD-10-CM | POA: Diagnosis not present

## 2020-04-24 DIAGNOSIS — J9601 Acute respiratory failure with hypoxia: Secondary | ICD-10-CM | POA: Diagnosis not present

## 2020-04-24 DIAGNOSIS — U071 COVID-19: Secondary | ICD-10-CM | POA: Diagnosis not present

## 2020-04-24 DIAGNOSIS — J1282 Pneumonia due to coronavirus disease 2019: Secondary | ICD-10-CM | POA: Diagnosis not present

## 2020-04-24 DIAGNOSIS — G20 Parkinson's disease: Secondary | ICD-10-CM | POA: Diagnosis not present

## 2020-04-24 DIAGNOSIS — I21A1 Myocardial infarction type 2: Secondary | ICD-10-CM | POA: Diagnosis not present

## 2020-04-25 ENCOUNTER — Other Ambulatory Visit: Payer: Self-pay

## 2020-04-25 ENCOUNTER — Ambulatory Visit (INDEPENDENT_AMBULATORY_CARE_PROVIDER_SITE_OTHER): Payer: Medicare Other | Admitting: Neurology

## 2020-04-25 ENCOUNTER — Telehealth: Payer: Self-pay

## 2020-04-25 ENCOUNTER — Encounter: Payer: Self-pay | Admitting: Neurology

## 2020-04-25 VITALS — BP 114/53 | HR 79 | Ht 72.0 in | Wt 152.0 lb

## 2020-04-25 DIAGNOSIS — G20 Parkinson's disease: Secondary | ICD-10-CM | POA: Diagnosis not present

## 2020-04-25 DIAGNOSIS — F0280 Dementia in other diseases classified elsewhere without behavioral disturbance: Secondary | ICD-10-CM | POA: Diagnosis not present

## 2020-04-25 NOTE — Telephone Encounter (Signed)
Spoke with patients son and informed him that because he is the patients POA he will need to complete the DPR forms and Late Policy and No Show policy forms for his dad. He voiced understanding and requested the forms be faxed to him. He states he will not be able to come this week but he will try to get by here next week to complete forms.   Spoke with Dr tat and she states the forms must be completed in office or the will have to be notarized.   Patient son notified and voiced understanding.

## 2020-04-25 NOTE — Patient Instructions (Signed)
No changes in your medication

## 2020-04-29 DIAGNOSIS — U071 COVID-19: Secondary | ICD-10-CM | POA: Diagnosis not present

## 2020-04-29 DIAGNOSIS — J432 Centrilobular emphysema: Secondary | ICD-10-CM | POA: Diagnosis not present

## 2020-04-29 DIAGNOSIS — J1282 Pneumonia due to coronavirus disease 2019: Secondary | ICD-10-CM | POA: Diagnosis not present

## 2020-04-29 DIAGNOSIS — J9601 Acute respiratory failure with hypoxia: Secondary | ICD-10-CM | POA: Diagnosis not present

## 2020-04-29 DIAGNOSIS — I21A1 Myocardial infarction type 2: Secondary | ICD-10-CM | POA: Diagnosis not present

## 2020-04-29 DIAGNOSIS — G20 Parkinson's disease: Secondary | ICD-10-CM | POA: Diagnosis not present

## 2020-05-01 DIAGNOSIS — E785 Hyperlipidemia, unspecified: Secondary | ICD-10-CM | POA: Diagnosis not present

## 2020-05-01 DIAGNOSIS — E039 Hypothyroidism, unspecified: Secondary | ICD-10-CM | POA: Diagnosis not present

## 2020-05-01 DIAGNOSIS — Z125 Encounter for screening for malignant neoplasm of prostate: Secondary | ICD-10-CM | POA: Diagnosis not present

## 2020-05-06 DIAGNOSIS — U071 COVID-19: Secondary | ICD-10-CM | POA: Diagnosis not present

## 2020-05-06 DIAGNOSIS — I21A1 Myocardial infarction type 2: Secondary | ICD-10-CM | POA: Diagnosis not present

## 2020-05-06 DIAGNOSIS — J1282 Pneumonia due to coronavirus disease 2019: Secondary | ICD-10-CM | POA: Diagnosis not present

## 2020-05-06 DIAGNOSIS — J432 Centrilobular emphysema: Secondary | ICD-10-CM | POA: Diagnosis not present

## 2020-05-06 DIAGNOSIS — G20 Parkinson's disease: Secondary | ICD-10-CM | POA: Diagnosis not present

## 2020-05-06 DIAGNOSIS — J9601 Acute respiratory failure with hypoxia: Secondary | ICD-10-CM | POA: Diagnosis not present

## 2020-05-08 DIAGNOSIS — E039 Hypothyroidism, unspecified: Secondary | ICD-10-CM | POA: Diagnosis not present

## 2020-05-08 DIAGNOSIS — N400 Enlarged prostate without lower urinary tract symptoms: Secondary | ICD-10-CM | POA: Diagnosis not present

## 2020-05-08 DIAGNOSIS — Z23 Encounter for immunization: Secondary | ICD-10-CM | POA: Diagnosis not present

## 2020-05-08 DIAGNOSIS — Z0000 Encounter for general adult medical examination without abnormal findings: Secondary | ICD-10-CM | POA: Diagnosis not present

## 2020-05-08 DIAGNOSIS — E785 Hyperlipidemia, unspecified: Secondary | ICD-10-CM | POA: Diagnosis not present

## 2020-05-08 DIAGNOSIS — G20 Parkinson's disease: Secondary | ICD-10-CM | POA: Diagnosis not present

## 2020-05-08 DIAGNOSIS — I480 Paroxysmal atrial fibrillation: Secondary | ICD-10-CM | POA: Diagnosis not present

## 2020-05-08 DIAGNOSIS — R2689 Other abnormalities of gait and mobility: Secondary | ICD-10-CM | POA: Diagnosis not present

## 2020-05-08 DIAGNOSIS — K4090 Unilateral inguinal hernia, without obstruction or gangrene, not specified as recurrent: Secondary | ICD-10-CM | POA: Diagnosis not present

## 2020-05-08 DIAGNOSIS — F418 Other specified anxiety disorders: Secondary | ICD-10-CM | POA: Diagnosis not present

## 2020-05-08 DIAGNOSIS — R82998 Other abnormal findings in urine: Secondary | ICD-10-CM | POA: Diagnosis not present

## 2020-05-08 DIAGNOSIS — M1612 Unilateral primary osteoarthritis, left hip: Secondary | ICD-10-CM | POA: Diagnosis not present

## 2020-05-10 DIAGNOSIS — J1282 Pneumonia due to coronavirus disease 2019: Secondary | ICD-10-CM | POA: Diagnosis not present

## 2020-05-10 DIAGNOSIS — U071 COVID-19: Secondary | ICD-10-CM | POA: Diagnosis not present

## 2020-05-10 DIAGNOSIS — J432 Centrilobular emphysema: Secondary | ICD-10-CM | POA: Diagnosis not present

## 2020-05-10 DIAGNOSIS — I21A1 Myocardial infarction type 2: Secondary | ICD-10-CM | POA: Diagnosis not present

## 2020-05-10 DIAGNOSIS — J9601 Acute respiratory failure with hypoxia: Secondary | ICD-10-CM | POA: Diagnosis not present

## 2020-05-10 DIAGNOSIS — G20 Parkinson's disease: Secondary | ICD-10-CM | POA: Diagnosis not present

## 2020-05-13 ENCOUNTER — Other Ambulatory Visit: Payer: Self-pay | Admitting: Neurology

## 2020-05-13 NOTE — Telephone Encounter (Signed)
Rx(s) sent to pharmacy electronically.  

## 2020-05-27 ENCOUNTER — Other Ambulatory Visit: Payer: Self-pay | Admitting: Neurology

## 2020-05-30 ENCOUNTER — Ambulatory Visit: Payer: Medicare Other | Admitting: Neurology

## 2020-06-26 DIAGNOSIS — Z9181 History of falling: Secondary | ICD-10-CM | POA: Diagnosis not present

## 2020-06-26 DIAGNOSIS — I480 Paroxysmal atrial fibrillation: Secondary | ICD-10-CM | POA: Diagnosis not present

## 2020-06-26 DIAGNOSIS — Z9889 Other specified postprocedural states: Secondary | ICD-10-CM | POA: Diagnosis not present

## 2020-06-26 DIAGNOSIS — Z8616 Personal history of COVID-19: Secondary | ICD-10-CM | POA: Diagnosis not present

## 2020-07-29 DIAGNOSIS — I480 Paroxysmal atrial fibrillation: Secondary | ICD-10-CM | POA: Diagnosis not present

## 2020-07-29 DIAGNOSIS — D696 Thrombocytopenia, unspecified: Secondary | ICD-10-CM | POA: Diagnosis not present

## 2020-07-29 DIAGNOSIS — Y998 Other external cause status: Secondary | ICD-10-CM | POA: Diagnosis not present

## 2020-07-29 DIAGNOSIS — R52 Pain, unspecified: Secondary | ICD-10-CM | POA: Diagnosis not present

## 2020-07-29 DIAGNOSIS — W1839XA Other fall on same level, initial encounter: Secondary | ICD-10-CM | POA: Diagnosis not present

## 2020-07-29 DIAGNOSIS — I10 Essential (primary) hypertension: Secondary | ICD-10-CM | POA: Diagnosis not present

## 2020-07-29 DIAGNOSIS — Z043 Encounter for examination and observation following other accident: Secondary | ICD-10-CM | POA: Diagnosis not present

## 2020-07-29 DIAGNOSIS — G20 Parkinson's disease: Secondary | ICD-10-CM | POA: Diagnosis not present

## 2020-07-29 DIAGNOSIS — S72001A Fracture of unspecified part of neck of right femur, initial encounter for closed fracture: Secondary | ICD-10-CM | POA: Diagnosis not present

## 2020-07-29 DIAGNOSIS — E039 Hypothyroidism, unspecified: Secondary | ICD-10-CM | POA: Diagnosis not present

## 2020-07-29 DIAGNOSIS — W19XXXA Unspecified fall, initial encounter: Secondary | ICD-10-CM | POA: Diagnosis not present

## 2020-07-29 DIAGNOSIS — J45909 Unspecified asthma, uncomplicated: Secondary | ICD-10-CM | POA: Diagnosis not present

## 2020-07-29 DIAGNOSIS — Z20822 Contact with and (suspected) exposure to covid-19: Secondary | ICD-10-CM | POA: Diagnosis not present

## 2020-07-29 DIAGNOSIS — Z9889 Other specified postprocedural states: Secondary | ICD-10-CM | POA: Diagnosis not present

## 2020-07-29 DIAGNOSIS — F329 Major depressive disorder, single episode, unspecified: Secondary | ICD-10-CM | POA: Diagnosis not present

## 2020-07-29 DIAGNOSIS — M25551 Pain in right hip: Secondary | ICD-10-CM | POA: Diagnosis not present

## 2020-07-29 DIAGNOSIS — R0902 Hypoxemia: Secondary | ICD-10-CM | POA: Diagnosis not present

## 2020-07-29 DIAGNOSIS — I447 Left bundle-branch block, unspecified: Secondary | ICD-10-CM | POA: Diagnosis not present

## 2020-07-30 DIAGNOSIS — Z471 Aftercare following joint replacement surgery: Secondary | ICD-10-CM | POA: Diagnosis not present

## 2020-07-30 DIAGNOSIS — I480 Paroxysmal atrial fibrillation: Secondary | ICD-10-CM | POA: Diagnosis not present

## 2020-07-30 DIAGNOSIS — F329 Major depressive disorder, single episode, unspecified: Secondary | ICD-10-CM | POA: Diagnosis not present

## 2020-07-30 DIAGNOSIS — J45909 Unspecified asthma, uncomplicated: Secondary | ICD-10-CM | POA: Diagnosis not present

## 2020-07-30 DIAGNOSIS — E039 Hypothyroidism, unspecified: Secondary | ICD-10-CM | POA: Diagnosis not present

## 2020-07-30 DIAGNOSIS — I10 Essential (primary) hypertension: Secondary | ICD-10-CM | POA: Diagnosis not present

## 2020-07-30 DIAGNOSIS — S72001A Fracture of unspecified part of neck of right femur, initial encounter for closed fracture: Secondary | ICD-10-CM | POA: Diagnosis not present

## 2020-07-30 DIAGNOSIS — G20 Parkinson's disease: Secondary | ICD-10-CM | POA: Diagnosis not present

## 2020-07-30 DIAGNOSIS — Z9889 Other specified postprocedural states: Secondary | ICD-10-CM | POA: Diagnosis not present

## 2020-07-30 DIAGNOSIS — Z96643 Presence of artificial hip joint, bilateral: Secondary | ICD-10-CM | POA: Diagnosis not present

## 2020-07-30 DIAGNOSIS — D696 Thrombocytopenia, unspecified: Secondary | ICD-10-CM | POA: Diagnosis not present

## 2020-07-31 DIAGNOSIS — Z9889 Other specified postprocedural states: Secondary | ICD-10-CM | POA: Diagnosis not present

## 2020-07-31 DIAGNOSIS — F329 Major depressive disorder, single episode, unspecified: Secondary | ICD-10-CM | POA: Diagnosis not present

## 2020-07-31 DIAGNOSIS — J189 Pneumonia, unspecified organism: Secondary | ICD-10-CM | POA: Diagnosis not present

## 2020-07-31 DIAGNOSIS — E039 Hypothyroidism, unspecified: Secondary | ICD-10-CM | POA: Diagnosis not present

## 2020-07-31 DIAGNOSIS — R296 Repeated falls: Secondary | ICD-10-CM | POA: Diagnosis not present

## 2020-07-31 DIAGNOSIS — G20 Parkinson's disease: Secondary | ICD-10-CM | POA: Diagnosis not present

## 2020-07-31 DIAGNOSIS — S72001A Fracture of unspecified part of neck of right femur, initial encounter for closed fracture: Secondary | ICD-10-CM | POA: Diagnosis not present

## 2020-07-31 DIAGNOSIS — D696 Thrombocytopenia, unspecified: Secondary | ICD-10-CM | POA: Diagnosis not present

## 2020-07-31 DIAGNOSIS — I480 Paroxysmal atrial fibrillation: Secondary | ICD-10-CM | POA: Diagnosis not present

## 2020-08-01 DIAGNOSIS — S72001A Fracture of unspecified part of neck of right femur, initial encounter for closed fracture: Secondary | ICD-10-CM | POA: Diagnosis not present

## 2020-08-01 DIAGNOSIS — Z9889 Other specified postprocedural states: Secondary | ICD-10-CM | POA: Diagnosis not present

## 2020-08-01 DIAGNOSIS — R296 Repeated falls: Secondary | ICD-10-CM | POA: Diagnosis not present

## 2020-08-01 DIAGNOSIS — G20 Parkinson's disease: Secondary | ICD-10-CM | POA: Diagnosis not present

## 2020-08-01 DIAGNOSIS — I480 Paroxysmal atrial fibrillation: Secondary | ICD-10-CM | POA: Diagnosis not present

## 2020-08-02 DIAGNOSIS — G20 Parkinson's disease: Secondary | ICD-10-CM | POA: Diagnosis not present

## 2020-08-02 DIAGNOSIS — Z9889 Other specified postprocedural states: Secondary | ICD-10-CM | POA: Diagnosis not present

## 2020-08-02 DIAGNOSIS — R296 Repeated falls: Secondary | ICD-10-CM | POA: Diagnosis not present

## 2020-08-02 DIAGNOSIS — S72001A Fracture of unspecified part of neck of right femur, initial encounter for closed fracture: Secondary | ICD-10-CM | POA: Diagnosis not present

## 2020-08-02 DIAGNOSIS — R0902 Hypoxemia: Secondary | ICD-10-CM | POA: Diagnosis not present

## 2020-08-02 DIAGNOSIS — M2550 Pain in unspecified joint: Secondary | ICD-10-CM | POA: Diagnosis not present

## 2020-08-02 DIAGNOSIS — Z7401 Bed confinement status: Secondary | ICD-10-CM | POA: Diagnosis not present

## 2020-08-02 DIAGNOSIS — I480 Paroxysmal atrial fibrillation: Secondary | ICD-10-CM | POA: Diagnosis not present

## 2020-08-03 DIAGNOSIS — Z8781 Personal history of (healed) traumatic fracture: Secondary | ICD-10-CM | POA: Diagnosis not present

## 2020-08-03 DIAGNOSIS — M1990 Unspecified osteoarthritis, unspecified site: Secondary | ICD-10-CM | POA: Diagnosis not present

## 2020-08-03 DIAGNOSIS — K921 Melena: Secondary | ICD-10-CM | POA: Diagnosis not present

## 2020-08-03 DIAGNOSIS — E611 Iron deficiency: Secondary | ICD-10-CM | POA: Diagnosis not present

## 2020-08-03 DIAGNOSIS — Z79899 Other long term (current) drug therapy: Secondary | ICD-10-CM | POA: Diagnosis not present

## 2020-08-03 DIAGNOSIS — Z96641 Presence of right artificial hip joint: Secondary | ICD-10-CM | POA: Diagnosis not present

## 2020-08-03 DIAGNOSIS — Z8546 Personal history of malignant neoplasm of prostate: Secondary | ICD-10-CM | POA: Diagnosis not present

## 2020-08-03 DIAGNOSIS — K219 Gastro-esophageal reflux disease without esophagitis: Secondary | ICD-10-CM | POA: Diagnosis not present

## 2020-08-03 DIAGNOSIS — Z9089 Acquired absence of other organs: Secondary | ICD-10-CM | POA: Diagnosis not present

## 2020-08-03 DIAGNOSIS — D62 Acute posthemorrhagic anemia: Secondary | ICD-10-CM | POA: Diagnosis not present

## 2020-08-03 DIAGNOSIS — F32A Depression, unspecified: Secondary | ICD-10-CM | POA: Diagnosis not present

## 2020-08-03 DIAGNOSIS — F419 Anxiety disorder, unspecified: Secondary | ICD-10-CM | POA: Diagnosis not present

## 2020-08-03 DIAGNOSIS — E039 Hypothyroidism, unspecified: Secondary | ICD-10-CM | POA: Diagnosis not present

## 2020-08-03 DIAGNOSIS — H6123 Impacted cerumen, bilateral: Secondary | ICD-10-CM | POA: Diagnosis not present

## 2020-08-03 DIAGNOSIS — I480 Paroxysmal atrial fibrillation: Secondary | ICD-10-CM | POA: Diagnosis not present

## 2020-08-03 DIAGNOSIS — I4891 Unspecified atrial fibrillation: Secondary | ICD-10-CM | POA: Diagnosis not present

## 2020-08-03 DIAGNOSIS — R32 Unspecified urinary incontinence: Secondary | ICD-10-CM | POA: Diagnosis not present

## 2020-08-03 DIAGNOSIS — Z7401 Bed confinement status: Secondary | ICD-10-CM | POA: Diagnosis not present

## 2020-08-03 DIAGNOSIS — R296 Repeated falls: Secondary | ICD-10-CM | POA: Diagnosis not present

## 2020-08-03 DIAGNOSIS — Z9181 History of falling: Secondary | ICD-10-CM | POA: Diagnosis not present

## 2020-08-03 DIAGNOSIS — S72001D Fracture of unspecified part of neck of right femur, subsequent encounter for closed fracture with routine healing: Secondary | ICD-10-CM | POA: Diagnosis not present

## 2020-08-03 DIAGNOSIS — G20 Parkinson's disease: Secondary | ICD-10-CM | POA: Diagnosis not present

## 2020-08-03 DIAGNOSIS — H906 Mixed conductive and sensorineural hearing loss, bilateral: Secondary | ICD-10-CM | POA: Diagnosis not present

## 2020-08-03 DIAGNOSIS — W19XXXD Unspecified fall, subsequent encounter: Secondary | ICD-10-CM | POA: Diagnosis not present

## 2020-08-03 DIAGNOSIS — Z7901 Long term (current) use of anticoagulants: Secondary | ICD-10-CM | POA: Diagnosis not present

## 2020-08-03 DIAGNOSIS — Z8701 Personal history of pneumonia (recurrent): Secondary | ICD-10-CM | POA: Diagnosis not present

## 2020-08-06 DIAGNOSIS — Z7901 Long term (current) use of anticoagulants: Secondary | ICD-10-CM | POA: Diagnosis not present

## 2020-08-06 DIAGNOSIS — D62 Acute posthemorrhagic anemia: Secondary | ICD-10-CM | POA: Diagnosis present

## 2020-08-06 DIAGNOSIS — F419 Anxiety disorder, unspecified: Secondary | ICD-10-CM | POA: Diagnosis present

## 2020-08-06 DIAGNOSIS — E039 Hypothyroidism, unspecified: Secondary | ICD-10-CM | POA: Diagnosis present

## 2020-08-06 DIAGNOSIS — K921 Melena: Secondary | ICD-10-CM | POA: Diagnosis not present

## 2020-08-06 DIAGNOSIS — R5381 Other malaise: Secondary | ICD-10-CM | POA: Diagnosis not present

## 2020-08-06 DIAGNOSIS — Z66 Do not resuscitate: Secondary | ICD-10-CM | POA: Diagnosis not present

## 2020-08-06 DIAGNOSIS — R531 Weakness: Secondary | ICD-10-CM | POA: Diagnosis not present

## 2020-08-06 DIAGNOSIS — F32A Depression, unspecified: Secondary | ICD-10-CM | POA: Diagnosis not present

## 2020-08-06 DIAGNOSIS — R059 Cough, unspecified: Secondary | ICD-10-CM | POA: Diagnosis not present

## 2020-08-06 DIAGNOSIS — R319 Hematuria, unspecified: Secondary | ICD-10-CM | POA: Diagnosis not present

## 2020-08-06 DIAGNOSIS — R638 Other symptoms and signs concerning food and fluid intake: Secondary | ICD-10-CM | POA: Diagnosis not present

## 2020-08-06 DIAGNOSIS — G20 Parkinson's disease: Secondary | ICD-10-CM | POA: Diagnosis present

## 2020-08-06 DIAGNOSIS — I959 Hypotension, unspecified: Secondary | ICD-10-CM | POA: Diagnosis not present

## 2020-08-06 DIAGNOSIS — I480 Paroxysmal atrial fibrillation: Secondary | ICD-10-CM | POA: Diagnosis present

## 2020-08-06 DIAGNOSIS — R279 Unspecified lack of coordination: Secondary | ICD-10-CM | POA: Diagnosis not present

## 2020-08-06 DIAGNOSIS — M1990 Unspecified osteoarthritis, unspecified site: Secondary | ICD-10-CM | POA: Diagnosis not present

## 2020-08-06 DIAGNOSIS — R296 Repeated falls: Secondary | ICD-10-CM | POA: Diagnosis present

## 2020-08-06 DIAGNOSIS — R0902 Hypoxemia: Secondary | ICD-10-CM | POA: Diagnosis not present

## 2020-08-06 DIAGNOSIS — R0689 Other abnormalities of breathing: Secondary | ICD-10-CM | POA: Diagnosis not present

## 2020-08-06 DIAGNOSIS — Z743 Need for continuous supervision: Secondary | ICD-10-CM | POA: Diagnosis not present

## 2020-08-06 DIAGNOSIS — F418 Other specified anxiety disorders: Secondary | ICD-10-CM | POA: Diagnosis not present

## 2020-08-06 DIAGNOSIS — I4891 Unspecified atrial fibrillation: Secondary | ICD-10-CM | POA: Diagnosis not present

## 2020-08-06 DIAGNOSIS — S72001D Fracture of unspecified part of neck of right femur, subsequent encounter for closed fracture with routine healing: Secondary | ICD-10-CM | POA: Diagnosis not present

## 2020-08-06 DIAGNOSIS — Z515 Encounter for palliative care: Secondary | ICD-10-CM | POA: Diagnosis not present

## 2020-08-06 DIAGNOSIS — K922 Gastrointestinal hemorrhage, unspecified: Secondary | ICD-10-CM | POA: Diagnosis not present

## 2020-08-07 DIAGNOSIS — E039 Hypothyroidism, unspecified: Secondary | ICD-10-CM | POA: Diagnosis not present

## 2020-08-07 DIAGNOSIS — R296 Repeated falls: Secondary | ICD-10-CM | POA: Diagnosis not present

## 2020-08-07 DIAGNOSIS — I480 Paroxysmal atrial fibrillation: Secondary | ICD-10-CM | POA: Diagnosis not present

## 2020-08-07 DIAGNOSIS — G20 Parkinson's disease: Secondary | ICD-10-CM | POA: Diagnosis not present

## 2020-08-07 DIAGNOSIS — F418 Other specified anxiety disorders: Secondary | ICD-10-CM | POA: Diagnosis not present

## 2020-08-07 DIAGNOSIS — D62 Acute posthemorrhagic anemia: Secondary | ICD-10-CM | POA: Diagnosis not present

## 2020-08-07 DIAGNOSIS — K921 Melena: Secondary | ICD-10-CM | POA: Diagnosis not present

## 2020-08-08 DIAGNOSIS — G20 Parkinson's disease: Secondary | ICD-10-CM | POA: Diagnosis not present

## 2020-08-08 DIAGNOSIS — K921 Melena: Secondary | ICD-10-CM | POA: Diagnosis not present

## 2020-08-08 DIAGNOSIS — E039 Hypothyroidism, unspecified: Secondary | ICD-10-CM | POA: Diagnosis not present

## 2020-08-08 DIAGNOSIS — R296 Repeated falls: Secondary | ICD-10-CM | POA: Diagnosis not present

## 2020-08-08 DIAGNOSIS — F32A Depression, unspecified: Secondary | ICD-10-CM | POA: Diagnosis not present

## 2020-08-08 DIAGNOSIS — F419 Anxiety disorder, unspecified: Secondary | ICD-10-CM | POA: Diagnosis not present

## 2020-08-08 DIAGNOSIS — D62 Acute posthemorrhagic anemia: Secondary | ICD-10-CM | POA: Diagnosis not present

## 2020-08-08 DIAGNOSIS — I480 Paroxysmal atrial fibrillation: Secondary | ICD-10-CM | POA: Diagnosis not present

## 2020-08-13 DIAGNOSIS — K921 Melena: Secondary | ICD-10-CM | POA: Diagnosis not present

## 2020-08-13 DIAGNOSIS — E039 Hypothyroidism, unspecified: Secondary | ICD-10-CM | POA: Diagnosis not present

## 2020-08-13 DIAGNOSIS — I4891 Unspecified atrial fibrillation: Secondary | ICD-10-CM | POA: Diagnosis not present

## 2020-08-13 DIAGNOSIS — G20 Parkinson's disease: Secondary | ICD-10-CM | POA: Diagnosis not present

## 2020-08-13 DIAGNOSIS — R296 Repeated falls: Secondary | ICD-10-CM | POA: Diagnosis not present

## 2020-08-13 DIAGNOSIS — M1990 Unspecified osteoarthritis, unspecified site: Secondary | ICD-10-CM | POA: Diagnosis not present

## 2020-08-13 DIAGNOSIS — S72001D Fracture of unspecified part of neck of right femur, subsequent encounter for closed fracture with routine healing: Secondary | ICD-10-CM | POA: Diagnosis not present

## 2020-08-13 DIAGNOSIS — D62 Acute posthemorrhagic anemia: Secondary | ICD-10-CM | POA: Diagnosis not present

## 2020-08-15 DIAGNOSIS — Z471 Aftercare following joint replacement surgery: Secondary | ICD-10-CM | POA: Diagnosis not present

## 2020-08-15 DIAGNOSIS — Z96641 Presence of right artificial hip joint: Secondary | ICD-10-CM | POA: Diagnosis not present

## 2020-08-16 DIAGNOSIS — M1990 Unspecified osteoarthritis, unspecified site: Secondary | ICD-10-CM | POA: Diagnosis not present

## 2020-08-16 DIAGNOSIS — R296 Repeated falls: Secondary | ICD-10-CM | POA: Diagnosis not present

## 2020-08-16 DIAGNOSIS — G20 Parkinson's disease: Secondary | ICD-10-CM | POA: Diagnosis not present

## 2020-08-16 DIAGNOSIS — I4891 Unspecified atrial fibrillation: Secondary | ICD-10-CM | POA: Diagnosis not present

## 2020-08-16 DIAGNOSIS — E039 Hypothyroidism, unspecified: Secondary | ICD-10-CM | POA: Diagnosis not present

## 2020-08-16 DIAGNOSIS — D62 Acute posthemorrhagic anemia: Secondary | ICD-10-CM | POA: Diagnosis not present

## 2020-08-16 DIAGNOSIS — K921 Melena: Secondary | ICD-10-CM | POA: Diagnosis not present

## 2020-08-16 DIAGNOSIS — S72001D Fracture of unspecified part of neck of right femur, subsequent encounter for closed fracture with routine healing: Secondary | ICD-10-CM | POA: Diagnosis not present

## 2020-08-20 DIAGNOSIS — S72001D Fracture of unspecified part of neck of right femur, subsequent encounter for closed fracture with routine healing: Secondary | ICD-10-CM | POA: Diagnosis not present

## 2020-08-20 DIAGNOSIS — R296 Repeated falls: Secondary | ICD-10-CM | POA: Diagnosis not present

## 2020-08-20 DIAGNOSIS — K921 Melena: Secondary | ICD-10-CM | POA: Diagnosis not present

## 2020-08-20 DIAGNOSIS — D62 Acute posthemorrhagic anemia: Secondary | ICD-10-CM | POA: Diagnosis not present

## 2020-08-20 DIAGNOSIS — M1990 Unspecified osteoarthritis, unspecified site: Secondary | ICD-10-CM | POA: Diagnosis not present

## 2020-08-20 DIAGNOSIS — I4891 Unspecified atrial fibrillation: Secondary | ICD-10-CM | POA: Diagnosis not present

## 2020-08-20 DIAGNOSIS — E039 Hypothyroidism, unspecified: Secondary | ICD-10-CM | POA: Diagnosis not present

## 2020-08-20 DIAGNOSIS — G20 Parkinson's disease: Secondary | ICD-10-CM | POA: Diagnosis not present

## 2020-08-23 DIAGNOSIS — G20 Parkinson's disease: Secondary | ICD-10-CM | POA: Diagnosis not present

## 2020-08-23 DIAGNOSIS — E039 Hypothyroidism, unspecified: Secondary | ICD-10-CM | POA: Diagnosis not present

## 2020-08-23 DIAGNOSIS — M1990 Unspecified osteoarthritis, unspecified site: Secondary | ICD-10-CM | POA: Diagnosis not present

## 2020-08-23 DIAGNOSIS — K921 Melena: Secondary | ICD-10-CM | POA: Diagnosis not present

## 2020-08-23 DIAGNOSIS — S72001D Fracture of unspecified part of neck of right femur, subsequent encounter for closed fracture with routine healing: Secondary | ICD-10-CM | POA: Diagnosis not present

## 2020-08-23 DIAGNOSIS — D62 Acute posthemorrhagic anemia: Secondary | ICD-10-CM | POA: Diagnosis not present

## 2020-08-23 DIAGNOSIS — R296 Repeated falls: Secondary | ICD-10-CM | POA: Diagnosis not present

## 2020-08-23 DIAGNOSIS — I4891 Unspecified atrial fibrillation: Secondary | ICD-10-CM | POA: Diagnosis not present

## 2020-08-24 DIAGNOSIS — S72001D Fracture of unspecified part of neck of right femur, subsequent encounter for closed fracture with routine healing: Secondary | ICD-10-CM | POA: Diagnosis not present

## 2020-08-24 DIAGNOSIS — K921 Melena: Secondary | ICD-10-CM | POA: Diagnosis not present

## 2020-08-24 DIAGNOSIS — M1990 Unspecified osteoarthritis, unspecified site: Secondary | ICD-10-CM | POA: Diagnosis not present

## 2020-08-24 DIAGNOSIS — R296 Repeated falls: Secondary | ICD-10-CM | POA: Diagnosis not present

## 2020-08-24 DIAGNOSIS — I4891 Unspecified atrial fibrillation: Secondary | ICD-10-CM | POA: Diagnosis not present

## 2020-08-24 DIAGNOSIS — G20 Parkinson's disease: Secondary | ICD-10-CM | POA: Diagnosis not present

## 2020-08-24 DIAGNOSIS — E039 Hypothyroidism, unspecified: Secondary | ICD-10-CM | POA: Diagnosis not present

## 2020-08-24 DIAGNOSIS — D62 Acute posthemorrhagic anemia: Secondary | ICD-10-CM | POA: Diagnosis not present

## 2020-08-27 DIAGNOSIS — E039 Hypothyroidism, unspecified: Secondary | ICD-10-CM | POA: Diagnosis not present

## 2020-08-27 DIAGNOSIS — S72001D Fracture of unspecified part of neck of right femur, subsequent encounter for closed fracture with routine healing: Secondary | ICD-10-CM | POA: Diagnosis not present

## 2020-08-27 DIAGNOSIS — K921 Melena: Secondary | ICD-10-CM | POA: Diagnosis not present

## 2020-08-27 DIAGNOSIS — G20 Parkinson's disease: Secondary | ICD-10-CM | POA: Diagnosis not present

## 2020-08-27 DIAGNOSIS — I4891 Unspecified atrial fibrillation: Secondary | ICD-10-CM | POA: Diagnosis not present

## 2020-08-27 DIAGNOSIS — D62 Acute posthemorrhagic anemia: Secondary | ICD-10-CM | POA: Diagnosis not present

## 2020-08-27 DIAGNOSIS — M1990 Unspecified osteoarthritis, unspecified site: Secondary | ICD-10-CM | POA: Diagnosis not present

## 2020-08-27 DIAGNOSIS — R296 Repeated falls: Secondary | ICD-10-CM | POA: Diagnosis not present

## 2020-08-29 DIAGNOSIS — M1990 Unspecified osteoarthritis, unspecified site: Secondary | ICD-10-CM | POA: Diagnosis not present

## 2020-08-29 DIAGNOSIS — K921 Melena: Secondary | ICD-10-CM | POA: Diagnosis not present

## 2020-08-29 DIAGNOSIS — R296 Repeated falls: Secondary | ICD-10-CM | POA: Diagnosis not present

## 2020-08-29 DIAGNOSIS — E039 Hypothyroidism, unspecified: Secondary | ICD-10-CM | POA: Diagnosis not present

## 2020-08-29 DIAGNOSIS — I4891 Unspecified atrial fibrillation: Secondary | ICD-10-CM | POA: Diagnosis not present

## 2020-08-29 DIAGNOSIS — D62 Acute posthemorrhagic anemia: Secondary | ICD-10-CM | POA: Diagnosis not present

## 2020-08-29 DIAGNOSIS — G20 Parkinson's disease: Secondary | ICD-10-CM | POA: Diagnosis not present

## 2020-08-29 DIAGNOSIS — S72001D Fracture of unspecified part of neck of right femur, subsequent encounter for closed fracture with routine healing: Secondary | ICD-10-CM | POA: Diagnosis not present

## 2020-09-01 DIAGNOSIS — I447 Left bundle-branch block, unspecified: Secondary | ICD-10-CM | POA: Diagnosis not present

## 2020-09-01 DIAGNOSIS — R0789 Other chest pain: Secondary | ICD-10-CM | POA: Diagnosis not present

## 2020-09-01 DIAGNOSIS — R079 Chest pain, unspecified: Secondary | ICD-10-CM | POA: Diagnosis not present

## 2020-09-02 DIAGNOSIS — K921 Melena: Secondary | ICD-10-CM | POA: Diagnosis not present

## 2020-09-02 DIAGNOSIS — E039 Hypothyroidism, unspecified: Secondary | ICD-10-CM | POA: Diagnosis not present

## 2020-09-02 DIAGNOSIS — E611 Iron deficiency: Secondary | ICD-10-CM | POA: Diagnosis not present

## 2020-09-02 DIAGNOSIS — F419 Anxiety disorder, unspecified: Secondary | ICD-10-CM | POA: Diagnosis not present

## 2020-09-02 DIAGNOSIS — Z79899 Other long term (current) drug therapy: Secondary | ICD-10-CM | POA: Diagnosis not present

## 2020-09-02 DIAGNOSIS — R32 Unspecified urinary incontinence: Secondary | ICD-10-CM | POA: Diagnosis not present

## 2020-09-02 DIAGNOSIS — H6123 Impacted cerumen, bilateral: Secondary | ICD-10-CM | POA: Diagnosis not present

## 2020-09-02 DIAGNOSIS — H906 Mixed conductive and sensorineural hearing loss, bilateral: Secondary | ICD-10-CM | POA: Diagnosis not present

## 2020-09-02 DIAGNOSIS — F32A Depression, unspecified: Secondary | ICD-10-CM | POA: Diagnosis not present

## 2020-09-02 DIAGNOSIS — Z9089 Acquired absence of other organs: Secondary | ICD-10-CM | POA: Diagnosis not present

## 2020-09-02 DIAGNOSIS — K219 Gastro-esophageal reflux disease without esophagitis: Secondary | ICD-10-CM | POA: Diagnosis not present

## 2020-09-02 DIAGNOSIS — G20 Parkinson's disease: Secondary | ICD-10-CM | POA: Diagnosis not present

## 2020-09-02 DIAGNOSIS — I480 Paroxysmal atrial fibrillation: Secondary | ICD-10-CM | POA: Diagnosis not present

## 2020-09-02 DIAGNOSIS — Z96641 Presence of right artificial hip joint: Secondary | ICD-10-CM | POA: Diagnosis not present

## 2020-09-02 DIAGNOSIS — Z9181 History of falling: Secondary | ICD-10-CM | POA: Diagnosis not present

## 2020-09-02 DIAGNOSIS — Z7401 Bed confinement status: Secondary | ICD-10-CM | POA: Diagnosis not present

## 2020-09-02 DIAGNOSIS — M1990 Unspecified osteoarthritis, unspecified site: Secondary | ICD-10-CM | POA: Diagnosis not present

## 2020-09-02 DIAGNOSIS — Z8701 Personal history of pneumonia (recurrent): Secondary | ICD-10-CM | POA: Diagnosis not present

## 2020-09-02 DIAGNOSIS — D62 Acute posthemorrhagic anemia: Secondary | ICD-10-CM | POA: Diagnosis not present

## 2020-09-02 DIAGNOSIS — Z8546 Personal history of malignant neoplasm of prostate: Secondary | ICD-10-CM | POA: Diagnosis not present

## 2020-09-02 DIAGNOSIS — W19XXXD Unspecified fall, subsequent encounter: Secondary | ICD-10-CM | POA: Diagnosis not present

## 2020-09-02 DIAGNOSIS — Z8781 Personal history of (healed) traumatic fracture: Secondary | ICD-10-CM | POA: Diagnosis not present

## 2020-09-02 DIAGNOSIS — S72001D Fracture of unspecified part of neck of right femur, subsequent encounter for closed fracture with routine healing: Secondary | ICD-10-CM | POA: Diagnosis not present

## 2020-09-02 DIAGNOSIS — R296 Repeated falls: Secondary | ICD-10-CM | POA: Diagnosis not present

## 2020-09-03 DIAGNOSIS — M1990 Unspecified osteoarthritis, unspecified site: Secondary | ICD-10-CM | POA: Diagnosis not present

## 2020-09-03 DIAGNOSIS — R296 Repeated falls: Secondary | ICD-10-CM | POA: Diagnosis not present

## 2020-09-03 DIAGNOSIS — K921 Melena: Secondary | ICD-10-CM | POA: Diagnosis not present

## 2020-09-03 DIAGNOSIS — S72001D Fracture of unspecified part of neck of right femur, subsequent encounter for closed fracture with routine healing: Secondary | ICD-10-CM | POA: Diagnosis not present

## 2020-09-03 DIAGNOSIS — G20 Parkinson's disease: Secondary | ICD-10-CM | POA: Diagnosis not present

## 2020-09-03 DIAGNOSIS — D62 Acute posthemorrhagic anemia: Secondary | ICD-10-CM | POA: Diagnosis not present

## 2020-09-04 DIAGNOSIS — D62 Acute posthemorrhagic anemia: Secondary | ICD-10-CM | POA: Diagnosis not present

## 2020-09-04 DIAGNOSIS — K921 Melena: Secondary | ICD-10-CM | POA: Diagnosis not present

## 2020-09-04 DIAGNOSIS — G20 Parkinson's disease: Secondary | ICD-10-CM | POA: Diagnosis not present

## 2020-09-04 DIAGNOSIS — M1990 Unspecified osteoarthritis, unspecified site: Secondary | ICD-10-CM | POA: Diagnosis not present

## 2020-09-04 DIAGNOSIS — S72001D Fracture of unspecified part of neck of right femur, subsequent encounter for closed fracture with routine healing: Secondary | ICD-10-CM | POA: Diagnosis not present

## 2020-09-04 DIAGNOSIS — R296 Repeated falls: Secondary | ICD-10-CM | POA: Diagnosis not present

## 2020-09-09 ENCOUNTER — Other Ambulatory Visit: Payer: Self-pay | Admitting: Neurology

## 2020-09-09 NOTE — Telephone Encounter (Signed)
Rx(s) sent to pharmacy electronically.  

## 2020-09-10 DIAGNOSIS — R296 Repeated falls: Secondary | ICD-10-CM | POA: Diagnosis not present

## 2020-09-10 DIAGNOSIS — S72001D Fracture of unspecified part of neck of right femur, subsequent encounter for closed fracture with routine healing: Secondary | ICD-10-CM | POA: Diagnosis not present

## 2020-09-10 DIAGNOSIS — M1990 Unspecified osteoarthritis, unspecified site: Secondary | ICD-10-CM | POA: Diagnosis not present

## 2020-09-10 DIAGNOSIS — K921 Melena: Secondary | ICD-10-CM | POA: Diagnosis not present

## 2020-09-10 DIAGNOSIS — D62 Acute posthemorrhagic anemia: Secondary | ICD-10-CM | POA: Diagnosis not present

## 2020-09-10 DIAGNOSIS — G20 Parkinson's disease: Secondary | ICD-10-CM | POA: Diagnosis not present

## 2020-09-12 DIAGNOSIS — M25572 Pain in left ankle and joints of left foot: Secondary | ICD-10-CM | POA: Diagnosis not present

## 2020-09-16 DIAGNOSIS — K921 Melena: Secondary | ICD-10-CM | POA: Diagnosis not present

## 2020-09-16 DIAGNOSIS — D62 Acute posthemorrhagic anemia: Secondary | ICD-10-CM | POA: Diagnosis not present

## 2020-09-16 DIAGNOSIS — S72001D Fracture of unspecified part of neck of right femur, subsequent encounter for closed fracture with routine healing: Secondary | ICD-10-CM | POA: Diagnosis not present

## 2020-09-16 DIAGNOSIS — M1990 Unspecified osteoarthritis, unspecified site: Secondary | ICD-10-CM | POA: Diagnosis not present

## 2020-09-16 DIAGNOSIS — G20 Parkinson's disease: Secondary | ICD-10-CM | POA: Diagnosis not present

## 2020-09-16 DIAGNOSIS — R296 Repeated falls: Secondary | ICD-10-CM | POA: Diagnosis not present

## 2020-09-23 DIAGNOSIS — D62 Acute posthemorrhagic anemia: Secondary | ICD-10-CM | POA: Diagnosis not present

## 2020-09-23 DIAGNOSIS — G20 Parkinson's disease: Secondary | ICD-10-CM | POA: Diagnosis not present

## 2020-09-23 DIAGNOSIS — S72001D Fracture of unspecified part of neck of right femur, subsequent encounter for closed fracture with routine healing: Secondary | ICD-10-CM | POA: Diagnosis not present

## 2020-09-23 DIAGNOSIS — R296 Repeated falls: Secondary | ICD-10-CM | POA: Diagnosis not present

## 2020-09-23 DIAGNOSIS — M1990 Unspecified osteoarthritis, unspecified site: Secondary | ICD-10-CM | POA: Diagnosis not present

## 2020-09-23 DIAGNOSIS — K921 Melena: Secondary | ICD-10-CM | POA: Diagnosis not present

## 2020-09-30 DIAGNOSIS — D62 Acute posthemorrhagic anemia: Secondary | ICD-10-CM | POA: Diagnosis not present

## 2020-09-30 DIAGNOSIS — K921 Melena: Secondary | ICD-10-CM | POA: Diagnosis not present

## 2020-09-30 DIAGNOSIS — G20 Parkinson's disease: Secondary | ICD-10-CM | POA: Diagnosis not present

## 2020-09-30 DIAGNOSIS — S72001D Fracture of unspecified part of neck of right femur, subsequent encounter for closed fracture with routine healing: Secondary | ICD-10-CM | POA: Diagnosis not present

## 2020-09-30 DIAGNOSIS — R296 Repeated falls: Secondary | ICD-10-CM | POA: Diagnosis not present

## 2020-09-30 DIAGNOSIS — M1990 Unspecified osteoarthritis, unspecified site: Secondary | ICD-10-CM | POA: Diagnosis not present

## 2020-10-01 DIAGNOSIS — D62 Acute posthemorrhagic anemia: Secondary | ICD-10-CM | POA: Diagnosis not present

## 2020-10-01 DIAGNOSIS — R296 Repeated falls: Secondary | ICD-10-CM | POA: Diagnosis not present

## 2020-10-01 DIAGNOSIS — S72001D Fracture of unspecified part of neck of right femur, subsequent encounter for closed fracture with routine healing: Secondary | ICD-10-CM | POA: Diagnosis not present

## 2020-10-01 DIAGNOSIS — K921 Melena: Secondary | ICD-10-CM | POA: Diagnosis not present

## 2020-10-01 DIAGNOSIS — G20 Parkinson's disease: Secondary | ICD-10-CM | POA: Diagnosis not present

## 2020-10-01 DIAGNOSIS — M1990 Unspecified osteoarthritis, unspecified site: Secondary | ICD-10-CM | POA: Diagnosis not present

## 2020-10-23 NOTE — Progress Notes (Signed)
Assessment/Plan:   1.  Parkinsons Disease  -Continue carbidopa/levodopa 25/100, 1.5 tablets 4 times per day but it keep it away from protein by 30 min.  Discussed timing with routine  -Continue carbidopa/levodopa 25/100 CR at bedtime.  They are to confirm that he is taking this.  Daughter did not know , but she also does not manage his medications.  -daughter asks about DBS.  He would not likely be a DBS candidate due to memory but also discussed that DBS doesn't do better than meds when working well and his meds generally working well  2.  Memory change, likely PDD  -Patient with 24 hour/day caregivers in the home.  -Patient's son is healthcare power of attorney but I didn't see a copy of that in chart.  I will request that from the family.  We did have to call his son after the visit to request this and he stated that he would get this to [place].  -we discussed neurocognitive testing.  They declined for now  3.  Atrial fibrillation  -Patient is on amiodarone  -Patient not on anticoagulation.    -Patient did have recent GI bleed and aspirin was stopped.  Subjective:   Nathan Stanley was seen today in follow up for Parkinsons disease.  My previous records were reviewed prior to todays visit as well as outside records available to me.  Daughter present and supplements the history.  Patient was in the emergency room January 31 after a fall resulting in a right hip fracture.  He ultimately had surgery on February 1 at [facility].  He sustained a GI bleed after that and was back in the hospital.  Family declined EGD/colonoscopy.  Current prescribed movement disorder medications: Carbidopa/levodopa 25/100, 1.5 tablets four times per day (8:30am/noon/3pm/6pm) - all of them are associated with meals or snack.   Carbidopa/levodopa 25/100 CR at bedtime (they aren't sure if he is taking this - caregivers manage meds and they aren't here)   ALLERGIES:  No Known Allergies  CURRENT  MEDICATIONS:  Outpatient Encounter Medications as of 10/25/2020  Medication Sig  . aspirin EC 81 MG tablet Take 1 tablet (81 mg total) by mouth daily.  . calcitonin, salmon, (MIACALCIN/FORTICAL) 200 UNIT/ACT nasal spray Place 1 spray into alternate nostrils daily.  . calcium citrate (CALCITRATE - DOSED IN MG ELEMENTAL CALCIUM) 950 (200 Ca) MG tablet Take 200 mg of elemental calcium by mouth daily.  . carbidopa-levodopa (SINEMET IR) 25-100 MG tablet TAKE 1 AND 1/2 TABLET BY MOUTH FOUR TIMES A DAY  . Carbidopa-Levodopa ER (SINEMET CR) 25-100 MG tablet controlled release TAKE ONE TABLET BY MOUTH AT BEDTIME  . Cholecalciferol (VITAMIN D-3) 25 MCG (1000 UT) CAPS Take 1 capsule by mouth daily.  Marland Kitchen diltiazem (DILACOR XR) 180 MG 24 hr capsule Take 180 mg by mouth daily.  Marland Kitchen escitalopram (LEXAPRO) 10 MG tablet Take 10 mg by mouth daily.  Marland Kitchen levothyroxine (SYNTHROID) 25 MCG tablet Take 25 mcg by mouth daily.  Marland Kitchen LORazepam (ATIVAN) 0.5 MG tablet Take 0.5 mg by mouth as needed for anxiety.   No facility-administered encounter medications on file as of 10/25/2020.    Objective:   PHYSICAL EXAMINATION:    VITALS:   Vitals:   10/25/20 1514  BP: 106/66  Pulse: 79  SpO2: 94%  Weight: 150 lb (68 kg)  Height: 6' (1.829 m)    GEN:  The patient appears stated age and is in NAD. HEENT:  Normocephalic, atraumatic.  The mucous membranes are moist. The superficial temporal arteries are without ropiness or tenderness. CV:  RRR Lungs:  CTAB Neck/HEME:  There are no carotid bruits bilaterally.  Neurological examination:  Orientation: The patient is alert and oriented to person, place and time today.  He was actually able to tell me his daytime medications and the exact time he took them, he was just unsure if he took the nighttime levodopa. Cranial nerves: There is good facial symmetry with mild facial hypomimia. The speech is fluent and clear. Soft palate rises symmetrically and there is no tongue deviation.  Hearing is decreased to conversational tone. Sensation: Sensation is intact to light touch throughout Motor: Strength is at least antigravity x4.  Movement examination: Tone: There is nl tone in the UE bilaterally Abnormal movements: there is L arm and leg dyskinesia.  There is rare L hand tremor Coordination:  There is no decremation with RAM's, but he has a disfigured hand on the right and cannot do RAMs on the right.   Gait and Station: The patient pushes off of the transport chair.  He is somewhat unsteady, even with a walker.  He is unsteady when he steps up on the scale and let go of the walker.   Total time spent on today's visit was 31 minutes, including both face-to-face time and nonface-to-face time.  Time included that spent on review of records (prior notes available to me/labs/imaging if pertinent), discussing treatment and goals, answering patient's questions and coordinating care.  Cc:  Burnard Bunting, MD

## 2020-10-25 ENCOUNTER — Other Ambulatory Visit: Payer: Self-pay

## 2020-10-25 ENCOUNTER — Ambulatory Visit (INDEPENDENT_AMBULATORY_CARE_PROVIDER_SITE_OTHER): Payer: Medicare Other | Admitting: Neurology

## 2020-10-25 ENCOUNTER — Encounter: Payer: Self-pay | Admitting: Neurology

## 2020-10-25 VITALS — BP 106/66 | HR 79 | Ht 72.0 in | Wt 150.0 lb

## 2020-10-25 DIAGNOSIS — G20 Parkinson's disease: Secondary | ICD-10-CM | POA: Diagnosis not present

## 2020-10-25 NOTE — Patient Instructions (Signed)
1.  Carbidopa/levodopa 25/100, 1.5 tablets four times per day (8:30am/noon/3pm/6pm)  2.  Carbidopa/levodopa 25/100 CR at bedtime  The physicians and staff at [facility] are committed to providing excellent care. You may receive a survey requesting feedback about your experience at our office. We strive to receive "very good" responses to the survey questions. If you feel that your experience would prevent you from giving the office a "very good " response, please contact our office to try to remedy the situation. We may be reached at [phone]. Thank you for taking the time out of your busy day to complete the survey.

## 2020-11-04 DIAGNOSIS — J180 Bronchopneumonia, unspecified organism: Secondary | ICD-10-CM | POA: Diagnosis not present

## 2020-11-11 DIAGNOSIS — R7301 Impaired fasting glucose: Secondary | ICD-10-CM | POA: Diagnosis not present

## 2020-11-11 DIAGNOSIS — I480 Paroxysmal atrial fibrillation: Secondary | ICD-10-CM | POA: Diagnosis not present

## 2020-11-11 DIAGNOSIS — R2689 Other abnormalities of gait and mobility: Secondary | ICD-10-CM | POA: Diagnosis not present

## 2020-11-11 DIAGNOSIS — G20 Parkinson's disease: Secondary | ICD-10-CM | POA: Diagnosis not present

## 2020-11-28 DIAGNOSIS — E7800 Pure hypercholesterolemia, unspecified: Secondary | ICD-10-CM | POA: Diagnosis not present

## 2020-11-28 DIAGNOSIS — Z8546 Personal history of malignant neoplasm of prostate: Secondary | ICD-10-CM | POA: Diagnosis not present

## 2020-11-28 DIAGNOSIS — R7301 Impaired fasting glucose: Secondary | ICD-10-CM | POA: Diagnosis not present

## 2020-11-28 DIAGNOSIS — I480 Paroxysmal atrial fibrillation: Secondary | ICD-10-CM | POA: Diagnosis not present

## 2020-11-28 DIAGNOSIS — R2689 Other abnormalities of gait and mobility: Secondary | ICD-10-CM | POA: Diagnosis not present

## 2020-11-28 DIAGNOSIS — Z6820 Body mass index (BMI) 20.0-20.9, adult: Secondary | ICD-10-CM | POA: Diagnosis not present

## 2020-11-28 DIAGNOSIS — M1990 Unspecified osteoarthritis, unspecified site: Secondary | ICD-10-CM | POA: Diagnosis not present

## 2020-11-28 DIAGNOSIS — Z7982 Long term (current) use of aspirin: Secondary | ICD-10-CM | POA: Diagnosis not present

## 2020-11-28 DIAGNOSIS — G20 Parkinson's disease: Secondary | ICD-10-CM | POA: Diagnosis not present

## 2020-11-28 DIAGNOSIS — Z9181 History of falling: Secondary | ICD-10-CM | POA: Diagnosis not present

## 2020-11-28 DIAGNOSIS — E039 Hypothyroidism, unspecified: Secondary | ICD-10-CM | POA: Diagnosis not present

## 2020-11-28 DIAGNOSIS — M810 Age-related osteoporosis without current pathological fracture: Secondary | ICD-10-CM | POA: Diagnosis not present

## 2020-11-28 DIAGNOSIS — F418 Other specified anxiety disorders: Secondary | ICD-10-CM | POA: Diagnosis not present

## 2020-11-28 DIAGNOSIS — N400 Enlarged prostate without lower urinary tract symptoms: Secondary | ICD-10-CM | POA: Diagnosis not present

## 2020-11-28 DIAGNOSIS — R634 Abnormal weight loss: Secondary | ICD-10-CM | POA: Diagnosis not present

## 2020-12-01 DIAGNOSIS — G20 Parkinson's disease: Secondary | ICD-10-CM | POA: Diagnosis not present

## 2020-12-01 DIAGNOSIS — R2689 Other abnormalities of gait and mobility: Secondary | ICD-10-CM | POA: Diagnosis not present

## 2020-12-01 DIAGNOSIS — R7301 Impaired fasting glucose: Secondary | ICD-10-CM | POA: Diagnosis not present

## 2020-12-01 DIAGNOSIS — E7800 Pure hypercholesterolemia, unspecified: Secondary | ICD-10-CM | POA: Diagnosis not present

## 2020-12-01 DIAGNOSIS — I480 Paroxysmal atrial fibrillation: Secondary | ICD-10-CM | POA: Diagnosis not present

## 2020-12-01 DIAGNOSIS — E039 Hypothyroidism, unspecified: Secondary | ICD-10-CM | POA: Diagnosis not present

## 2020-12-02 DIAGNOSIS — R2689 Other abnormalities of gait and mobility: Secondary | ICD-10-CM | POA: Diagnosis not present

## 2020-12-02 DIAGNOSIS — E7800 Pure hypercholesterolemia, unspecified: Secondary | ICD-10-CM | POA: Diagnosis not present

## 2020-12-02 DIAGNOSIS — E039 Hypothyroidism, unspecified: Secondary | ICD-10-CM | POA: Diagnosis not present

## 2020-12-02 DIAGNOSIS — R7301 Impaired fasting glucose: Secondary | ICD-10-CM | POA: Diagnosis not present

## 2020-12-02 DIAGNOSIS — G20 Parkinson's disease: Secondary | ICD-10-CM | POA: Diagnosis not present

## 2020-12-02 DIAGNOSIS — I480 Paroxysmal atrial fibrillation: Secondary | ICD-10-CM | POA: Diagnosis not present

## 2020-12-03 DIAGNOSIS — E039 Hypothyroidism, unspecified: Secondary | ICD-10-CM | POA: Diagnosis not present

## 2020-12-03 DIAGNOSIS — E7800 Pure hypercholesterolemia, unspecified: Secondary | ICD-10-CM | POA: Diagnosis not present

## 2020-12-03 DIAGNOSIS — R2689 Other abnormalities of gait and mobility: Secondary | ICD-10-CM | POA: Diagnosis not present

## 2020-12-03 DIAGNOSIS — R7301 Impaired fasting glucose: Secondary | ICD-10-CM | POA: Diagnosis not present

## 2020-12-03 DIAGNOSIS — I480 Paroxysmal atrial fibrillation: Secondary | ICD-10-CM | POA: Diagnosis not present

## 2020-12-03 DIAGNOSIS — G20 Parkinson's disease: Secondary | ICD-10-CM | POA: Diagnosis not present

## 2020-12-04 DIAGNOSIS — E7800 Pure hypercholesterolemia, unspecified: Secondary | ICD-10-CM | POA: Diagnosis not present

## 2020-12-04 DIAGNOSIS — E039 Hypothyroidism, unspecified: Secondary | ICD-10-CM | POA: Diagnosis not present

## 2020-12-04 DIAGNOSIS — R7301 Impaired fasting glucose: Secondary | ICD-10-CM | POA: Diagnosis not present

## 2020-12-04 DIAGNOSIS — R2689 Other abnormalities of gait and mobility: Secondary | ICD-10-CM | POA: Diagnosis not present

## 2020-12-04 DIAGNOSIS — G20 Parkinson's disease: Secondary | ICD-10-CM | POA: Diagnosis not present

## 2020-12-04 DIAGNOSIS — I480 Paroxysmal atrial fibrillation: Secondary | ICD-10-CM | POA: Diagnosis not present

## 2020-12-11 DIAGNOSIS — R2689 Other abnormalities of gait and mobility: Secondary | ICD-10-CM | POA: Diagnosis not present

## 2020-12-11 DIAGNOSIS — E7800 Pure hypercholesterolemia, unspecified: Secondary | ICD-10-CM | POA: Diagnosis not present

## 2020-12-11 DIAGNOSIS — G20 Parkinson's disease: Secondary | ICD-10-CM | POA: Diagnosis not present

## 2020-12-11 DIAGNOSIS — I480 Paroxysmal atrial fibrillation: Secondary | ICD-10-CM | POA: Diagnosis not present

## 2020-12-11 DIAGNOSIS — E039 Hypothyroidism, unspecified: Secondary | ICD-10-CM | POA: Diagnosis not present

## 2020-12-11 DIAGNOSIS — R7301 Impaired fasting glucose: Secondary | ICD-10-CM | POA: Diagnosis not present

## 2020-12-13 DIAGNOSIS — R7301 Impaired fasting glucose: Secondary | ICD-10-CM | POA: Diagnosis not present

## 2020-12-13 DIAGNOSIS — E039 Hypothyroidism, unspecified: Secondary | ICD-10-CM | POA: Diagnosis not present

## 2020-12-13 DIAGNOSIS — G20 Parkinson's disease: Secondary | ICD-10-CM | POA: Diagnosis not present

## 2020-12-13 DIAGNOSIS — R2689 Other abnormalities of gait and mobility: Secondary | ICD-10-CM | POA: Diagnosis not present

## 2020-12-13 DIAGNOSIS — E7800 Pure hypercholesterolemia, unspecified: Secondary | ICD-10-CM | POA: Diagnosis not present

## 2020-12-13 DIAGNOSIS — I480 Paroxysmal atrial fibrillation: Secondary | ICD-10-CM | POA: Diagnosis not present

## 2020-12-16 DIAGNOSIS — I480 Paroxysmal atrial fibrillation: Secondary | ICD-10-CM | POA: Diagnosis not present

## 2020-12-16 DIAGNOSIS — R7301 Impaired fasting glucose: Secondary | ICD-10-CM | POA: Diagnosis not present

## 2020-12-16 DIAGNOSIS — E039 Hypothyroidism, unspecified: Secondary | ICD-10-CM | POA: Diagnosis not present

## 2020-12-16 DIAGNOSIS — E7800 Pure hypercholesterolemia, unspecified: Secondary | ICD-10-CM | POA: Diagnosis not present

## 2020-12-16 DIAGNOSIS — G20 Parkinson's disease: Secondary | ICD-10-CM | POA: Diagnosis not present

## 2020-12-16 DIAGNOSIS — R2689 Other abnormalities of gait and mobility: Secondary | ICD-10-CM | POA: Diagnosis not present

## 2020-12-19 DIAGNOSIS — E039 Hypothyroidism, unspecified: Secondary | ICD-10-CM | POA: Diagnosis not present

## 2020-12-19 DIAGNOSIS — R7301 Impaired fasting glucose: Secondary | ICD-10-CM | POA: Diagnosis not present

## 2020-12-19 DIAGNOSIS — I480 Paroxysmal atrial fibrillation: Secondary | ICD-10-CM | POA: Diagnosis not present

## 2020-12-19 DIAGNOSIS — G20 Parkinson's disease: Secondary | ICD-10-CM | POA: Diagnosis not present

## 2020-12-19 DIAGNOSIS — R2689 Other abnormalities of gait and mobility: Secondary | ICD-10-CM | POA: Diagnosis not present

## 2020-12-19 DIAGNOSIS — E7800 Pure hypercholesterolemia, unspecified: Secondary | ICD-10-CM | POA: Diagnosis not present

## 2020-12-25 DIAGNOSIS — I480 Paroxysmal atrial fibrillation: Secondary | ICD-10-CM | POA: Diagnosis not present

## 2020-12-25 DIAGNOSIS — G20 Parkinson's disease: Secondary | ICD-10-CM | POA: Diagnosis not present

## 2020-12-25 DIAGNOSIS — E7800 Pure hypercholesterolemia, unspecified: Secondary | ICD-10-CM | POA: Diagnosis not present

## 2020-12-25 DIAGNOSIS — E039 Hypothyroidism, unspecified: Secondary | ICD-10-CM | POA: Diagnosis not present

## 2020-12-25 DIAGNOSIS — R2689 Other abnormalities of gait and mobility: Secondary | ICD-10-CM | POA: Diagnosis not present

## 2020-12-25 DIAGNOSIS — R7301 Impaired fasting glucose: Secondary | ICD-10-CM | POA: Diagnosis not present

## 2020-12-27 DIAGNOSIS — R7301 Impaired fasting glucose: Secondary | ICD-10-CM | POA: Diagnosis not present

## 2020-12-27 DIAGNOSIS — E039 Hypothyroidism, unspecified: Secondary | ICD-10-CM | POA: Diagnosis not present

## 2020-12-27 DIAGNOSIS — I480 Paroxysmal atrial fibrillation: Secondary | ICD-10-CM | POA: Diagnosis not present

## 2020-12-27 DIAGNOSIS — E7800 Pure hypercholesterolemia, unspecified: Secondary | ICD-10-CM | POA: Diagnosis not present

## 2020-12-27 DIAGNOSIS — G20 Parkinson's disease: Secondary | ICD-10-CM | POA: Diagnosis not present

## 2020-12-27 DIAGNOSIS — R2689 Other abnormalities of gait and mobility: Secondary | ICD-10-CM | POA: Diagnosis not present

## 2020-12-28 DIAGNOSIS — M1990 Unspecified osteoarthritis, unspecified site: Secondary | ICD-10-CM | POA: Diagnosis not present

## 2020-12-28 DIAGNOSIS — R634 Abnormal weight loss: Secondary | ICD-10-CM | POA: Diagnosis not present

## 2020-12-28 DIAGNOSIS — R2689 Other abnormalities of gait and mobility: Secondary | ICD-10-CM | POA: Diagnosis not present

## 2020-12-28 DIAGNOSIS — G20 Parkinson's disease: Secondary | ICD-10-CM | POA: Diagnosis not present

## 2020-12-28 DIAGNOSIS — R7301 Impaired fasting glucose: Secondary | ICD-10-CM | POA: Diagnosis not present

## 2020-12-28 DIAGNOSIS — Z7982 Long term (current) use of aspirin: Secondary | ICD-10-CM | POA: Diagnosis not present

## 2020-12-28 DIAGNOSIS — I480 Paroxysmal atrial fibrillation: Secondary | ICD-10-CM | POA: Diagnosis not present

## 2020-12-28 DIAGNOSIS — N400 Enlarged prostate without lower urinary tract symptoms: Secondary | ICD-10-CM | POA: Diagnosis not present

## 2020-12-28 DIAGNOSIS — Z9181 History of falling: Secondary | ICD-10-CM | POA: Diagnosis not present

## 2020-12-28 DIAGNOSIS — F418 Other specified anxiety disorders: Secondary | ICD-10-CM | POA: Diagnosis not present

## 2020-12-28 DIAGNOSIS — E7800 Pure hypercholesterolemia, unspecified: Secondary | ICD-10-CM | POA: Diagnosis not present

## 2020-12-28 DIAGNOSIS — Z8546 Personal history of malignant neoplasm of prostate: Secondary | ICD-10-CM | POA: Diagnosis not present

## 2020-12-28 DIAGNOSIS — Z6820 Body mass index (BMI) 20.0-20.9, adult: Secondary | ICD-10-CM | POA: Diagnosis not present

## 2020-12-28 DIAGNOSIS — M810 Age-related osteoporosis without current pathological fracture: Secondary | ICD-10-CM | POA: Diagnosis not present

## 2020-12-28 DIAGNOSIS — E039 Hypothyroidism, unspecified: Secondary | ICD-10-CM | POA: Diagnosis not present

## 2020-12-31 ENCOUNTER — Other Ambulatory Visit: Payer: Self-pay | Admitting: Neurology

## 2021-01-02 DIAGNOSIS — E039 Hypothyroidism, unspecified: Secondary | ICD-10-CM | POA: Diagnosis not present

## 2021-01-02 DIAGNOSIS — R2689 Other abnormalities of gait and mobility: Secondary | ICD-10-CM | POA: Diagnosis not present

## 2021-01-02 DIAGNOSIS — I480 Paroxysmal atrial fibrillation: Secondary | ICD-10-CM | POA: Diagnosis not present

## 2021-01-02 DIAGNOSIS — R7301 Impaired fasting glucose: Secondary | ICD-10-CM | POA: Diagnosis not present

## 2021-01-02 DIAGNOSIS — E7800 Pure hypercholesterolemia, unspecified: Secondary | ICD-10-CM | POA: Diagnosis not present

## 2021-01-02 DIAGNOSIS — G20 Parkinson's disease: Secondary | ICD-10-CM | POA: Diagnosis not present

## 2021-01-07 DIAGNOSIS — E039 Hypothyroidism, unspecified: Secondary | ICD-10-CM | POA: Diagnosis not present

## 2021-01-07 DIAGNOSIS — I480 Paroxysmal atrial fibrillation: Secondary | ICD-10-CM | POA: Diagnosis not present

## 2021-01-07 DIAGNOSIS — E7800 Pure hypercholesterolemia, unspecified: Secondary | ICD-10-CM | POA: Diagnosis not present

## 2021-01-07 DIAGNOSIS — R7301 Impaired fasting glucose: Secondary | ICD-10-CM | POA: Diagnosis not present

## 2021-01-07 DIAGNOSIS — G20 Parkinson's disease: Secondary | ICD-10-CM | POA: Diagnosis not present

## 2021-01-07 DIAGNOSIS — R2689 Other abnormalities of gait and mobility: Secondary | ICD-10-CM | POA: Diagnosis not present

## 2021-01-08 DIAGNOSIS — G20 Parkinson's disease: Secondary | ICD-10-CM | POA: Diagnosis not present

## 2021-01-08 DIAGNOSIS — R2689 Other abnormalities of gait and mobility: Secondary | ICD-10-CM | POA: Diagnosis not present

## 2021-01-08 DIAGNOSIS — E039 Hypothyroidism, unspecified: Secondary | ICD-10-CM | POA: Diagnosis not present

## 2021-01-08 DIAGNOSIS — I480 Paroxysmal atrial fibrillation: Secondary | ICD-10-CM | POA: Diagnosis not present

## 2021-01-08 DIAGNOSIS — R7301 Impaired fasting glucose: Secondary | ICD-10-CM | POA: Diagnosis not present

## 2021-01-08 DIAGNOSIS — E7800 Pure hypercholesterolemia, unspecified: Secondary | ICD-10-CM | POA: Diagnosis not present

## 2021-01-14 DIAGNOSIS — E039 Hypothyroidism, unspecified: Secondary | ICD-10-CM | POA: Diagnosis not present

## 2021-01-14 DIAGNOSIS — G20 Parkinson's disease: Secondary | ICD-10-CM | POA: Diagnosis not present

## 2021-01-14 DIAGNOSIS — R7301 Impaired fasting glucose: Secondary | ICD-10-CM | POA: Diagnosis not present

## 2021-01-14 DIAGNOSIS — E7800 Pure hypercholesterolemia, unspecified: Secondary | ICD-10-CM | POA: Diagnosis not present

## 2021-01-14 DIAGNOSIS — R2689 Other abnormalities of gait and mobility: Secondary | ICD-10-CM | POA: Diagnosis not present

## 2021-01-14 DIAGNOSIS — I480 Paroxysmal atrial fibrillation: Secondary | ICD-10-CM | POA: Diagnosis not present

## 2021-01-15 DIAGNOSIS — E039 Hypothyroidism, unspecified: Secondary | ICD-10-CM | POA: Diagnosis not present

## 2021-01-15 DIAGNOSIS — R7301 Impaired fasting glucose: Secondary | ICD-10-CM | POA: Diagnosis not present

## 2021-01-15 DIAGNOSIS — R2689 Other abnormalities of gait and mobility: Secondary | ICD-10-CM | POA: Diagnosis not present

## 2021-01-15 DIAGNOSIS — G20 Parkinson's disease: Secondary | ICD-10-CM | POA: Diagnosis not present

## 2021-01-15 DIAGNOSIS — E7800 Pure hypercholesterolemia, unspecified: Secondary | ICD-10-CM | POA: Diagnosis not present

## 2021-01-15 DIAGNOSIS — I480 Paroxysmal atrial fibrillation: Secondary | ICD-10-CM | POA: Diagnosis not present

## 2021-01-20 DIAGNOSIS — G20 Parkinson's disease: Secondary | ICD-10-CM | POA: Diagnosis not present

## 2021-01-20 DIAGNOSIS — E039 Hypothyroidism, unspecified: Secondary | ICD-10-CM | POA: Diagnosis not present

## 2021-01-20 DIAGNOSIS — R2689 Other abnormalities of gait and mobility: Secondary | ICD-10-CM | POA: Diagnosis not present

## 2021-01-20 DIAGNOSIS — R7301 Impaired fasting glucose: Secondary | ICD-10-CM | POA: Diagnosis not present

## 2021-01-20 DIAGNOSIS — I480 Paroxysmal atrial fibrillation: Secondary | ICD-10-CM | POA: Diagnosis not present

## 2021-01-20 DIAGNOSIS — E7800 Pure hypercholesterolemia, unspecified: Secondary | ICD-10-CM | POA: Diagnosis not present

## 2021-01-23 DIAGNOSIS — R7301 Impaired fasting glucose: Secondary | ICD-10-CM | POA: Diagnosis not present

## 2021-01-23 DIAGNOSIS — R2689 Other abnormalities of gait and mobility: Secondary | ICD-10-CM | POA: Diagnosis not present

## 2021-01-23 DIAGNOSIS — E039 Hypothyroidism, unspecified: Secondary | ICD-10-CM | POA: Diagnosis not present

## 2021-01-23 DIAGNOSIS — I480 Paroxysmal atrial fibrillation: Secondary | ICD-10-CM | POA: Diagnosis not present

## 2021-01-23 DIAGNOSIS — E7800 Pure hypercholesterolemia, unspecified: Secondary | ICD-10-CM | POA: Diagnosis not present

## 2021-01-23 DIAGNOSIS — G20 Parkinson's disease: Secondary | ICD-10-CM | POA: Diagnosis not present

## 2021-02-04 ENCOUNTER — Encounter: Payer: Self-pay | Admitting: Neurology

## 2021-02-26 ENCOUNTER — Other Ambulatory Visit: Payer: Self-pay | Admitting: Neurology

## 2021-03-28 DIAGNOSIS — E785 Hyperlipidemia, unspecified: Secondary | ICD-10-CM | POA: Diagnosis not present

## 2021-03-28 DIAGNOSIS — E039 Hypothyroidism, unspecified: Secondary | ICD-10-CM | POA: Diagnosis not present

## 2021-03-28 DIAGNOSIS — I480 Paroxysmal atrial fibrillation: Secondary | ICD-10-CM | POA: Diagnosis not present

## 2021-03-28 DIAGNOSIS — G20 Parkinson's disease: Secondary | ICD-10-CM | POA: Diagnosis not present

## 2021-04-02 ENCOUNTER — Other Ambulatory Visit: Payer: Self-pay | Admitting: Neurology

## 2021-04-02 DIAGNOSIS — G20 Parkinson's disease: Secondary | ICD-10-CM

## 2021-04-02 DIAGNOSIS — F0280 Dementia in other diseases classified elsewhere without behavioral disturbance: Secondary | ICD-10-CM

## 2021-04-03 ENCOUNTER — Other Ambulatory Visit: Payer: Self-pay

## 2021-04-03 DIAGNOSIS — H6123 Impacted cerumen, bilateral: Secondary | ICD-10-CM | POA: Diagnosis not present

## 2021-04-29 ENCOUNTER — Ambulatory Visit: Payer: Medicare Other | Admitting: Neurology

## 2021-05-01 DIAGNOSIS — H903 Sensorineural hearing loss, bilateral: Secondary | ICD-10-CM | POA: Diagnosis not present

## 2021-05-01 DIAGNOSIS — Z7409 Other reduced mobility: Secondary | ICD-10-CM | POA: Diagnosis not present

## 2021-05-01 DIAGNOSIS — G20 Parkinson's disease: Secondary | ICD-10-CM | POA: Diagnosis not present

## 2021-05-01 DIAGNOSIS — H938X3 Other specified disorders of ear, bilateral: Secondary | ICD-10-CM | POA: Diagnosis not present

## 2021-05-05 NOTE — Progress Notes (Deleted)
Assessment/Plan:   1.  Parkinsons Disease  -Continue carbidopa/levodopa 25/100, 1.5 tablets 4 times per day but it keep it away from protein by 30 min.  Discussed timing with routine  -Continue carbidopa/levodopa 25/100 CR at bedtime.  They are to confirm that he is taking this.  Daughter did not know , but she also does not manage his medications.   2.  Memory change, likely PDD  -Patient with 24 hour/day caregivers in the home.  -They have declined neurocognitive testing.  3.  Atrial fibrillation  -Patient is on amiodarone  -Patient not on anticoagulation.    -Patient did have recent GI bleed and aspirin was stopped.  Subjective:   Nathan Stanley was seen today in follow up for Parkinsons disease.  My previous records were reviewed prior to todays visit as well as outside records available to me.  Daughter present and supplements the history.  Patient recently seen by ENT on May 01, 2021 for sensorineural hearing loss.  They scheduled an MRI of the brain to rule out a retrocochlear lesion.  I do not see that that has been completed.  Current prescribed movement disorder medications: Carbidopa/levodopa 25/100, 1.5 tablets four times per day (8:30am/noon/3pm/6pm)  Carbidopa/levodopa 25/100 CR at bedtime    ALLERGIES:  No Known Allergies  CURRENT MEDICATIONS:  Outpatient Encounter Medications as of 05/06/2021  Medication Sig   aspirin EC 81 MG tablet Take 1 tablet (81 mg total) by mouth daily.   calcitonin, salmon, (MIACALCIN/FORTICAL) 200 UNIT/ACT nasal spray Place 1 spray into alternate nostrils daily.   calcium citrate (CALCITRATE - DOSED IN MG ELEMENTAL CALCIUM) 950 (200 Ca) MG tablet Take 200 mg of elemental calcium by mouth daily.   carbidopa-levodopa (SINEMET IR) 25-100 MG tablet TAKE 1 AND 1/2  TABLETS BY MOUTH FOUR TIMES A DAY   Carbidopa-Levodopa ER (SINEMET CR) 25-100 MG tablet controlled release TAKE ONE TABLET BY MOUTH AT BEDTIME   Cholecalciferol (VITAMIN D-3)  25 MCG (1000 UT) CAPS Take 1 capsule by mouth daily.   diltiazem (DILACOR XR) 180 MG 24 hr capsule Take 180 mg by mouth daily.   escitalopram (LEXAPRO) 10 MG tablet Take 10 mg by mouth daily.   levothyroxine (SYNTHROID) 25 MCG tablet Take 25 mcg by mouth daily.   LORazepam (ATIVAN) 0.5 MG tablet Take 0.5 mg by mouth as needed for anxiety.   No facility-administered encounter medications on file as of 05/06/2021.    Objective:   PHYSICAL EXAMINATION:    VITALS:   There were no vitals filed for this visit.   GEN:  The patient appears stated age and is in NAD. HEENT:  Normocephalic, atraumatic.  The mucous membranes are moist. The superficial temporal arteries are without ropiness or tenderness. CV:  RRR Lungs:  CTAB Neck/HEME:  There are no carotid bruits bilaterally.  Neurological examination:  Orientation: The patient is alert and oriented to person, place and time today.  He was actually able to tell me his daytime medications and the exact time he took them, he was just unsure if he took the nighttime levodopa. Cranial nerves: There is good facial symmetry with mild facial hypomimia. The speech is fluent and clear. Soft palate rises symmetrically and there is no tongue deviation. Hearing is decreased to conversational tone. Sensation: Sensation is intact to light touch throughout Motor: Strength is at least antigravity x4.  Movement examination: Tone: There is nl tone in the UE bilaterally Abnormal movements: there is L arm and leg dyskinesia.  There is rare L hand tremor Coordination:  There is no decremation with RAM's, but he has a disfigured hand on the right and cannot do RAMs on the right.   Gait and Station: The patient pushes off of the transport chair.  He is somewhat unsteady, even with a walker.  He is unsteady when he steps up on the scale and let go of the walker.   Total time spent on today's visit was 31 minutes, including both face-to-face time and  nonface-to-face time.  Time included that spent on review of records (prior notes available to me/labs/imaging if pertinent), discussing treatment and goals, answering patient's questions and coordinating care.  Cc:  Burnard Bunting, MD

## 2021-05-06 ENCOUNTER — Ambulatory Visit: Payer: Medicare Other | Admitting: Neurology

## 2021-05-21 DIAGNOSIS — E039 Hypothyroidism, unspecified: Secondary | ICD-10-CM | POA: Diagnosis not present

## 2021-05-21 DIAGNOSIS — E785 Hyperlipidemia, unspecified: Secondary | ICD-10-CM | POA: Diagnosis not present

## 2021-05-21 DIAGNOSIS — C61 Malignant neoplasm of prostate: Secondary | ICD-10-CM | POA: Diagnosis not present

## 2021-05-21 DIAGNOSIS — Z0000 Encounter for general adult medical examination without abnormal findings: Secondary | ICD-10-CM | POA: Diagnosis not present

## 2021-05-21 DIAGNOSIS — Z125 Encounter for screening for malignant neoplasm of prostate: Secondary | ICD-10-CM | POA: Diagnosis not present

## 2021-05-21 DIAGNOSIS — G20 Parkinson's disease: Secondary | ICD-10-CM | POA: Diagnosis not present

## 2021-05-21 DIAGNOSIS — R7301 Impaired fasting glucose: Secondary | ICD-10-CM | POA: Diagnosis not present

## 2021-05-21 DIAGNOSIS — K4090 Unilateral inguinal hernia, without obstruction or gangrene, not specified as recurrent: Secondary | ICD-10-CM | POA: Diagnosis not present

## 2021-05-21 DIAGNOSIS — Z1331 Encounter for screening for depression: Secondary | ICD-10-CM | POA: Diagnosis not present

## 2021-05-21 DIAGNOSIS — I480 Paroxysmal atrial fibrillation: Secondary | ICD-10-CM | POA: Diagnosis not present

## 2021-05-21 DIAGNOSIS — Z1339 Encounter for screening examination for other mental health and behavioral disorders: Secondary | ICD-10-CM | POA: Diagnosis not present

## 2021-05-21 DIAGNOSIS — M1990 Unspecified osteoarthritis, unspecified site: Secondary | ICD-10-CM | POA: Diagnosis not present

## 2021-06-26 DIAGNOSIS — R0981 Nasal congestion: Secondary | ICD-10-CM | POA: Diagnosis not present

## 2021-06-26 DIAGNOSIS — R059 Cough, unspecified: Secondary | ICD-10-CM | POA: Diagnosis not present

## 2021-06-26 DIAGNOSIS — R5383 Other fatigue: Secondary | ICD-10-CM | POA: Diagnosis not present

## 2021-06-26 DIAGNOSIS — J029 Acute pharyngitis, unspecified: Secondary | ICD-10-CM | POA: Diagnosis not present

## 2021-06-26 DIAGNOSIS — J069 Acute upper respiratory infection, unspecified: Secondary | ICD-10-CM | POA: Diagnosis not present

## 2021-06-26 DIAGNOSIS — Z1152 Encounter for screening for COVID-19: Secondary | ICD-10-CM | POA: Diagnosis not present

## 2021-06-26 DIAGNOSIS — G479 Sleep disorder, unspecified: Secondary | ICD-10-CM | POA: Diagnosis not present

## 2021-07-03 ENCOUNTER — Ambulatory Visit: Payer: Medicare Other | Admitting: Neurology

## 2021-07-09 ENCOUNTER — Other Ambulatory Visit: Payer: Self-pay | Admitting: Neurology

## 2021-07-09 DIAGNOSIS — F0280 Dementia in other diseases classified elsewhere without behavioral disturbance: Secondary | ICD-10-CM

## 2021-07-09 DIAGNOSIS — G20 Parkinson's disease: Secondary | ICD-10-CM

## 2021-08-01 DIAGNOSIS — Z9889 Other specified postprocedural states: Secondary | ICD-10-CM | POA: Diagnosis not present

## 2021-08-01 DIAGNOSIS — I4891 Unspecified atrial fibrillation: Secondary | ICD-10-CM | POA: Diagnosis not present

## 2021-08-01 DIAGNOSIS — Z8679 Personal history of other diseases of the circulatory system: Secondary | ICD-10-CM | POA: Diagnosis not present

## 2021-08-04 DIAGNOSIS — Z79899 Other long term (current) drug therapy: Secondary | ICD-10-CM | POA: Diagnosis not present

## 2021-08-04 DIAGNOSIS — R002 Palpitations: Secondary | ICD-10-CM | POA: Diagnosis not present

## 2021-08-04 DIAGNOSIS — K219 Gastro-esophageal reflux disease without esophagitis: Secondary | ICD-10-CM | POA: Diagnosis not present

## 2021-08-04 DIAGNOSIS — G20 Parkinson's disease: Secondary | ICD-10-CM | POA: Diagnosis not present

## 2021-08-04 DIAGNOSIS — R7989 Other specified abnormal findings of blood chemistry: Secondary | ICD-10-CM | POA: Diagnosis not present

## 2021-08-04 DIAGNOSIS — I480 Paroxysmal atrial fibrillation: Secondary | ICD-10-CM | POA: Diagnosis not present

## 2021-08-04 DIAGNOSIS — E039 Hypothyroidism, unspecified: Secondary | ICD-10-CM | POA: Diagnosis not present

## 2021-08-04 DIAGNOSIS — E785 Hyperlipidemia, unspecified: Secondary | ICD-10-CM | POA: Diagnosis not present

## 2021-08-11 ENCOUNTER — Ambulatory Visit: Payer: Medicare Other | Admitting: Neurology

## 2021-08-13 ENCOUNTER — Other Ambulatory Visit: Payer: Self-pay | Admitting: Neurology

## 2021-08-13 DIAGNOSIS — G20 Parkinson's disease: Secondary | ICD-10-CM

## 2021-08-13 DIAGNOSIS — F0280 Dementia in other diseases classified elsewhere without behavioral disturbance: Secondary | ICD-10-CM

## 2021-09-01 DIAGNOSIS — Z79899 Other long term (current) drug therapy: Secondary | ICD-10-CM | POA: Diagnosis not present

## 2021-09-01 DIAGNOSIS — E039 Hypothyroidism, unspecified: Secondary | ICD-10-CM | POA: Diagnosis not present

## 2021-09-01 DIAGNOSIS — D649 Anemia, unspecified: Secondary | ICD-10-CM | POA: Diagnosis not present

## 2021-09-01 DIAGNOSIS — Z9889 Other specified postprocedural states: Secondary | ICD-10-CM | POA: Diagnosis not present

## 2021-09-01 DIAGNOSIS — G20 Parkinson's disease: Secondary | ICD-10-CM | POA: Diagnosis not present

## 2021-09-01 DIAGNOSIS — G255 Other chorea: Secondary | ICD-10-CM | POA: Diagnosis not present

## 2021-09-01 DIAGNOSIS — I4891 Unspecified atrial fibrillation: Secondary | ICD-10-CM | POA: Diagnosis not present

## 2021-09-01 DIAGNOSIS — Z8679 Personal history of other diseases of the circulatory system: Secondary | ICD-10-CM | POA: Diagnosis not present

## 2021-10-09 ENCOUNTER — Institutional Professional Consult (permissible substitution): Payer: Medicare Other | Admitting: Internal Medicine

## 2021-10-09 NOTE — Progress Notes (Deleted)
? ?  Nathan Stanley, male    DOB: 05-23-1927    MRN: [id number] ? ? ?Brief patient profile:  ?referred to pulmonary clinic 10/09/2021 by *** for ***     ? ? ? ?History of Present Illness  ?10/09/2021  Pulmonary/ 1st office eval/[facility]  ?No chief complaint on file. ?  ? ?Dyspnea:  *** ?Cough: *** ?Sleep: *** ?SABA use:  ? ?Past Medical History:  ?Diagnosis Date  ? Adenomatous colon polyp   ? Anxiety   ? BPH (benign prostatic hyperplasia)   ? Depression   ? Diverticulosis   ? Hypercholesterolemia   ? Hyperlipidemia   ? Osteoarthritis   ? Osteoporosis   ? PAF (paroxysmal atrial fibrillation) ([place])   ? Parkinsonism ([place])   ? Prostate cancer ([place])   ? Tremor   ? ? ?Outpatient Medications Prior to Visit  ?Medication Sig Dispense Refill  ? aspirin EC 81 MG tablet Take 1 tablet (81 mg total) by mouth daily.    ? calcitonin, salmon, (MIACALCIN/FORTICAL) 200 UNIT/ACT nasal spray Place 1 spray into alternate nostrils daily.    ? calcium citrate (CALCITRATE - DOSED IN MG ELEMENTAL CALCIUM) 950 (200 Ca) MG tablet Take 200 mg of elemental calcium by mouth daily.    ? carbidopa-levodopa (SINEMET IR) 25-100 MG tablet TAKE 1 AND 1/2 TABLETS BY MOUTH FOUR TIMES A DAY 198 tablet 0  ? Carbidopa-Levodopa ER (SINEMET CR) 25-100 MG tablet controlled release TAKE ONE TABLET BY MOUTH AT BEDTIME 90 tablet 2  ? Cholecalciferol (VITAMIN D-3) 25 MCG (1000 UT) CAPS Take 1 capsule by mouth daily.    ? diltiazem (DILACOR XR) 180 MG 24 hr capsule Take 180 mg by mouth daily.    ? escitalopram (LEXAPRO) 10 MG tablet Take 10 mg by mouth daily.    ? levothyroxine (SYNTHROID) 25 MCG tablet Take 25 mcg by mouth daily.    ? LORazepam (ATIVAN) 0.5 MG tablet Take 0.5 mg by mouth as needed for anxiety.    ? ?No facility-administered medications prior to visit.  ? ? ? ?Objective:  ?  ? ?There were no vitals taken for this visit. ? ?  ? ? ?   ?Assessment  ? ?No problem-specific Assessment & Plan notes found for this encounter. ? ? ? ? ?Christinia Gully, MD ?10/09/2021 ?  ?

## 2021-10-24 ENCOUNTER — Encounter: Payer: Self-pay | Admitting: Internal Medicine

## 2021-10-24 ENCOUNTER — Ambulatory Visit (INDEPENDENT_AMBULATORY_CARE_PROVIDER_SITE_OTHER): Payer: Medicare Other | Admitting: Internal Medicine

## 2021-10-24 DIAGNOSIS — R053 Chronic cough: Secondary | ICD-10-CM | POA: Diagnosis not present

## 2021-10-24 DIAGNOSIS — R0609 Other forms of dyspnea: Secondary | ICD-10-CM

## 2021-10-24 MED ORDER — FAMOTIDINE 20 MG PO TABS: ORAL_TABLET | ORAL | 11 refills | Status: DC

## 2021-10-24 MED ORDER — PANTOPRAZOLE SODIUM 40 MG PO TBEC: 40.0000 mg | DELAYED_RELEASE_TABLET | Freq: Every day | ORAL | 2 refills | Status: DC

## 2021-10-24 NOTE — Patient Instructions (Addendum)
Pantoprazole (protonix) 40 mg   Take  30-60 min before first meal of the day and Pepcid (famotidine)  20 mg after supper until return to office - this is the best way to tell whether stomach acid is contributing to your problem.   ? ?GERD (REFLUX)  is an extremely common cause of respiratory symptoms just like yours , many times with no obvious heartburn at all.  ? ? It can be treated with medication, but also with lifestyle changes including elevation of the head of your bed (ideally with 6 -8inch blocks under the headboard of your bed),  Smoking cessation, avoidance of late meals, excessive alcohol, and avoid fatty foods, chocolate, peppermint, colas, red wine, and acidic juices such as orange juice.  ?NO MINT OR MENTHOL PRODUCTS SO NO COUGH DROPS  ?USE SUGARLESS CANDY INSTEAD (Jolley ranchers or Stover's or Life Savers) or even ice chips will also do - the key is to swallow to prevent all throat clearing. ?NO OIL BASED VITAMINS - use powdered substitutes.  Avoid fish oil when coughing.  ? ? ?If cough or swallowing not better next step is a modified barium swallow but I would let Dr Reynaldo Minium make that call and pulmonary follow up is as needed  ?

## 2021-10-24 NOTE — Assessment & Plan Note (Signed)
Onset per pt prior to 2002 when moved in to present home ?- Swallowing study done at [facility] 08/12/20 "For occasional cough, chest x-ray was done which was nonacute. ST evaluation and treatment was done and patient was recommended ground diet. He was also placed on as needed antitussive and bronchodilator. Cough improved"  (pt and daughter do not remember any dietary restrictions ? ? ?>>>  10/24/2021 max gerd rx and f/u MBS next step if not better at high risk asp due to Parkinson's dz  ? ? ?Comment: ?The most common causes of chronic cough in immunocompetent adults include the following: upper airway cough syndrome (UACS), previously referred to as postnasal drip syndrome (PNDS), which is caused by variety of rhinosinus conditions; (2) asthma; (3) GERD; (4) chronic bronchitis from cigarette smoking or other inhaled environmental irritants; (5) nonasthmatic eosinophilic bronchitis; and (6) bronchiectasis. ? ? These conditions, singly or in combination, have accounted for up to 94% of the causes of chronic cough in prospective studies. ? ? Other conditions have constituted no >6% of the causes in prospective studies These have included bronchogenic carcinoma, chronic interstitial pneumonia, sarcoidosis, left ventricular failure, ACEI-induced cough, and aspiration from a condition associated with pharyngeal dysfunction.   ? ?Chronic cough is often simultaneously caused by more than one condition. A single cause has been found from 38 to 82% of the time, multiple causes from 18 to 62%. Multiply caused cough has been the result of three diseases up to 42% of the time.  ? ?   ?Advised:  ?NB the  ramp to expected improvement in symptoms from an empiric trial of PPI (and for that matter, worsening, if a chronic effective medication is stopped)  can be measured in weeks, not days, a common misconception because this is not the same as treating heartburn (no immediate cause and effect relationship)  so that response to therapy or  lack thereof can be very difficult to assess especially if the patient is not adherent to the treatment plan which includes dietary restrictions.  ? ?

## 2021-10-24 NOTE — Progress Notes (Signed)
? ?Nathan Stanley, male    DOB: 1927-01-01,    MRN: [id number] ? ? ?Brief patient profile:  ?67 yowm never smoker with parkinson's dz self referred to pulmonary clinic 10/24/2021  for  cough x "around 25 years"    ? ? ? ?History of Present Illness  ?10/24/2021  Pulmonary/ 1st office eval/Anne Boltz  ?Chief Complaint  ?Patient presents with  ? Pulmonary Consult  ?  Self referral. Pt c/o cough since Sept 2021. He is coughing up white to clear sputum. Cough occ wakes him in the night.   ?Dyspnea:  100 ft/gets tired  ?Cough: insists it started prior to moving into new house 21 y prior to OV  mostly dry worse p eats / sense of globus ?Sleep: not disturbing slee  ?SABA use: not using  ? ?No obvious day to day or daytime variability or assoc excess/ purulent sputum or mucus plugs or hemoptysis or cp or chest tightness, subjective wheeze or overt sinus or hb symptoms.  ? ?Sleeping  without nocturnal  or early am exacerbation  of respiratory  c/o's or need for noct saba. Also denies any obvious fluctuation of symptoms with weather or environmental changes or other aggravating or alleviating factors except as outlined above  ? ?No unusual exposure hx or h/o childhood pna/ asthma or knowledge of premature birth. ? ?Current Allergies, Complete Past Medical History, Past Surgical History, Family History, and Social History were reviewed in [employer] record. ? ?ROS  The following are not active complaints unless bolded ?Hoarseness, sore throat, dysphagia, dental problems, itching, sneezing,  nasal congestion or discharge of excess mucus or purulent secretions, ear ache,   fever, chills, sweats, unintended wt loss or wt gain, classically pleuritic or exertional cp,  orthopnea pnd or arm/hand swelling  or leg swelling, presyncope, palpitations, abdominal pain, anorexia, nausea, vomiting, diarrhea  or change in bowel habits or change in bladder habits, change in stools or change in urine, dysuria, hematuria,  rash,  arthralgias, visual complaints, headache, numbness, weakness or ataxia or problems with walking or coordination,  change in mood or  memory. ?      ?   ? ?Past Medical History:  ?Diagnosis Date  ? Adenomatous colon polyp   ? Anxiety   ? BPH (benign prostatic hyperplasia)   ? Depression   ? Diverticulosis   ? Hypercholesterolemia   ? Hyperlipidemia   ? Osteoarthritis   ? Osteoporosis   ? PAF (paroxysmal atrial fibrillation) ([place])   ? Parkinsonism ([place])   ? Prostate cancer ([place])   ? Tremor   ? ? ?Outpatient Medications Prior to Visit  ?Medication Sig Dispense Refill  ? aspirin EC 81 MG tablet Take 1 tablet (81 mg total) by mouth daily.    ? calcitonin, salmon, (MIACALCIN/FORTICAL) 200 UNIT/ACT nasal spray Place 1 spray into alternate nostrils daily.    ? calcium citrate (CALCITRATE - DOSED IN MG ELEMENTAL CALCIUM) 950 (200 Ca) MG tablet Take 200 mg of elemental calcium by mouth daily.    ? carbidopa-levodopa (SINEMET IR) 25-100 MG tablet TAKE 1 AND 1/2 TABLETS BY MOUTH FOUR TIMES A DAY 198 tablet 0  ? Carbidopa-Levodopa ER (SINEMET CR) 25-100 MG tablet controlled release TAKE ONE TABLET BY MOUTH AT BEDTIME 90 tablet 2  ? Cholecalciferol (VITAMIN D-3) 25 MCG (1000 UT) CAPS Take 1 capsule by mouth daily.    ? diltiazem (DILACOR XR) 180 MG 24 hr capsule Take 180 mg by mouth daily.    ?  escitalopram (LEXAPRO) 10 MG tablet Take 10 mg by mouth daily.    ? levothyroxine (SYNTHROID) 25 MCG tablet Take 25 mcg by mouth daily.    ? LORazepam (ATIVAN) 0.5 MG tablet Take 0.5 mg by mouth as needed for anxiety.    ? ?No facility-administered medications prior to visit.  ? ? ? ?Objective:  ?  ? ?BP 126/68 (BP Location: Left Arm, Cuff Size: Normal)   Pulse 66   Temp 97.7 ?F (36.5 ?C) (Oral)   Ht 6' (1.829 m)   Wt 152 lb (68.9 kg)   SpO2 94% Comment: on RA  BMI 20.61 kg/m?  ? ?SpO2: 94 % (on RA) ? ?W/c bound elderly hoarse wm with ?  choreathetoid  movements  ? ? HEENT : 2 lower teeth left / no cobblestoning or excess pnd/ freq  throat clearing   ? ? ?NECK :  without JVD/Nodes/TM/ nl carotid upstrokes bilaterally ? ? ?LUNGS: no acc muscle use,  Nl contour chest which is clear to A and P bilaterally without cough on insp or exp maneuvers ? ? ?CV:  RRR  no s3 or murmur or increase in P2, and no edema  ? ?ABD:  soft and nontender with nl inspiratory excursion in the supine position. No bruits or organomegaly appreciated, bowel sounds nl ? ?MS:   ext warm with R hand deformity , calf tenderness, cyanosis or clubbing ?No obvious joint restrictions  ? ?SKIN: warm and dry without lesions   ? ?NEURO:  alert,  no motor or cerebellar deficits apparent.  Walks moderately fast pace with wlker  ? ? ? ?Reynaldo Minium eval was  a few weeks prior to OV  per pt and daughter with cxr report "ok" but not available at time of ov  ?   ?Assessment  ? ?Chronic cough ?Onset per pt prior to 2002 when moved in to present home ?- Swallowing study done at [facility] 08/12/20 "For occasional cough, chest x-ray was done which was nonacute. ST evaluation and treatment was done and patient was recommended ground diet. He was also placed on as needed antitussive and bronchodilator. Cough improved"  (pt and daughter do not remember any dietary restrictions ? ? ?>>>  10/24/2021 max gerd rx and f/u MBS next step if not better at high risk asp due to Parkinson's dz  ? ? ?Comment: ?The most common causes of chronic cough in immunocompetent adults include the following: upper airway cough syndrome (UACS), previously referred to as postnasal drip syndrome (PNDS), which is caused by variety of rhinosinus conditions; (2) asthma; (3) GERD; (4) chronic bronchitis from cigarette smoking or other inhaled environmental irritants; (5) nonasthmatic eosinophilic bronchitis; and (6) bronchiectasis. ? ? These conditions, singly or in combination, have accounted for up to 94% of the causes of chronic cough in prospective studies. ? ? Other conditions have constituted no >6% of the causes in prospective  studies These have included bronchogenic carcinoma, chronic interstitial pneumonia, sarcoidosis, left ventricular failure, ACEI-induced cough, and aspiration from a condition associated with pharyngeal dysfunction.   ? ?Chronic cough is often simultaneously caused by more than one condition. A single cause has been found from 38 to 82% of the time, multiple causes from 18 to 62%. Multiply caused cough has been the result of three diseases up to 42% of the time.  ? ?   ?Advised:  ?NB the  ramp to expected improvement in symptoms from an empiric trial of PPI (and for that matter, worsening, if a chronic effective  medication is stopped)  can be measured in weeks, not days, a common misconception because this is not the same as treating heartburn (no immediate cause and effect relationship)  so that response to therapy or lack thereof can be very difficult to assess especially if the patient is not adherent to the treatment plan which includes dietary restrictions.  ? ? ?DOE (dyspnea on exertion) ?Onset ? When  ?-  10/24/2021   Walked on RA  x    approx 200  ft  @ moderate pace/rolling walker, stopped due to feeling  tired  with lowest 02 sats 92%  ? ?He walks twice as far for [place] and twice as fast as I expected he would and was not limited by doe.  rec cont PT and eval sats at peak levels of ex with pulmoonary f/u prn desats though most likely mechanism I would be concerned about in this setting is recurrent low grade aspiration. ? ? ?Each maintenance medication was reviewed in detail including emphasizing most importantly the difference between maintenance and prns and under what circumstances the prns are to be triggered using an action plan format where appropriate. ? ?Total time for H and P, chart review, counseling,  directly observing portions of ambulatory 02 saturation study/ and generating customized AVS unique to this office visit / same day charting = 40 min with pt new to me ?     ?  ?       ? ? ? ? ?Christinia Gully, MD ?10/24/2021 ?  ?

## 2021-10-26 ENCOUNTER — Encounter: Payer: Self-pay | Admitting: Internal Medicine

## 2021-10-26 NOTE — Assessment & Plan Note (Signed)
Onset ? When  ?-  10/24/2021   Walked on RA  x    approx 200  ft  @ moderate pace/rolling walker, stopped due to feeling  tired  with lowest 02 sats 92%  ? ?He walks twice as far for [place] and twice as fast as I expected he would and was not limited by doe.  rec cont PT and eval sats at peak levels of ex with pulmoonary f/u prn desats though most likely mechanism I would be concerned about in this setting is recurrent low grade aspiration. ? ? ?Each maintenance medication was reviewed in detail including emphasizing most importantly the difference between maintenance and prns and under what circumstances the prns are to be triggered using an action plan format where appropriate. ? ?Total time for H and P, chart review, counseling,  directly observing portions of ambulatory 02 saturation study/ and generating customized AVS unique to this office visit / same day charting = 40 min with pt new to me ?     ?  ?       ?

## 2021-11-05 DIAGNOSIS — I480 Paroxysmal atrial fibrillation: Secondary | ICD-10-CM | POA: Diagnosis not present

## 2021-11-05 DIAGNOSIS — F419 Anxiety disorder, unspecified: Secondary | ICD-10-CM | POA: Diagnosis not present

## 2021-11-05 DIAGNOSIS — R053 Chronic cough: Secondary | ICD-10-CM | POA: Diagnosis not present

## 2021-11-05 DIAGNOSIS — G20 Parkinson's disease: Secondary | ICD-10-CM | POA: Diagnosis not present

## 2021-11-13 ENCOUNTER — Other Ambulatory Visit: Payer: Self-pay | Admitting: Neurology

## 2021-11-14 DIAGNOSIS — G20 Parkinson's disease: Secondary | ICD-10-CM | POA: Diagnosis not present

## 2021-11-14 DIAGNOSIS — K219 Gastro-esophageal reflux disease without esophagitis: Secondary | ICD-10-CM | POA: Diagnosis not present

## 2021-11-14 DIAGNOSIS — Z96642 Presence of left artificial hip joint: Secondary | ICD-10-CM | POA: Diagnosis not present

## 2021-11-14 DIAGNOSIS — F419 Anxiety disorder, unspecified: Secondary | ICD-10-CM | POA: Diagnosis not present

## 2021-11-14 DIAGNOSIS — I480 Paroxysmal atrial fibrillation: Secondary | ICD-10-CM | POA: Diagnosis not present

## 2021-11-14 DIAGNOSIS — M810 Age-related osteoporosis without current pathological fracture: Secondary | ICD-10-CM | POA: Diagnosis not present

## 2021-11-14 DIAGNOSIS — Z9181 History of falling: Secondary | ICD-10-CM | POA: Diagnosis not present

## 2021-11-14 DIAGNOSIS — H9193 Unspecified hearing loss, bilateral: Secondary | ICD-10-CM | POA: Diagnosis not present

## 2021-11-14 DIAGNOSIS — Z7982 Long term (current) use of aspirin: Secondary | ICD-10-CM | POA: Diagnosis not present

## 2021-11-14 DIAGNOSIS — Z96653 Presence of artificial knee joint, bilateral: Secondary | ICD-10-CM | POA: Diagnosis not present

## 2021-11-14 DIAGNOSIS — R053 Chronic cough: Secondary | ICD-10-CM | POA: Diagnosis not present

## 2021-11-14 DIAGNOSIS — Z6821 Body mass index (BMI) 21.0-21.9, adult: Secondary | ICD-10-CM | POA: Diagnosis not present

## 2021-11-14 DIAGNOSIS — E7800 Pure hypercholesterolemia, unspecified: Secondary | ICD-10-CM | POA: Diagnosis not present

## 2021-11-14 DIAGNOSIS — Z8546 Personal history of malignant neoplasm of prostate: Secondary | ICD-10-CM | POA: Diagnosis not present

## 2021-11-14 DIAGNOSIS — R634 Abnormal weight loss: Secondary | ICD-10-CM | POA: Diagnosis not present

## 2021-11-14 DIAGNOSIS — F32A Depression, unspecified: Secondary | ICD-10-CM | POA: Diagnosis not present

## 2021-11-14 DIAGNOSIS — E039 Hypothyroidism, unspecified: Secondary | ICD-10-CM | POA: Diagnosis not present

## 2021-11-14 DIAGNOSIS — N400 Enlarged prostate without lower urinary tract symptoms: Secondary | ICD-10-CM | POA: Diagnosis not present

## 2021-11-18 DIAGNOSIS — M810 Age-related osteoporosis without current pathological fracture: Secondary | ICD-10-CM | POA: Diagnosis not present

## 2021-11-18 DIAGNOSIS — R634 Abnormal weight loss: Secondary | ICD-10-CM | POA: Diagnosis not present

## 2021-11-18 DIAGNOSIS — F419 Anxiety disorder, unspecified: Secondary | ICD-10-CM | POA: Diagnosis not present

## 2021-11-18 DIAGNOSIS — G20 Parkinson's disease: Secondary | ICD-10-CM | POA: Diagnosis not present

## 2021-11-18 DIAGNOSIS — E039 Hypothyroidism, unspecified: Secondary | ICD-10-CM | POA: Diagnosis not present

## 2021-11-18 DIAGNOSIS — F32A Depression, unspecified: Secondary | ICD-10-CM | POA: Diagnosis not present

## 2021-11-25 DIAGNOSIS — G20 Parkinson's disease: Secondary | ICD-10-CM | POA: Diagnosis not present

## 2021-11-25 DIAGNOSIS — F419 Anxiety disorder, unspecified: Secondary | ICD-10-CM | POA: Diagnosis not present

## 2021-11-25 DIAGNOSIS — R634 Abnormal weight loss: Secondary | ICD-10-CM | POA: Diagnosis not present

## 2021-11-25 DIAGNOSIS — M810 Age-related osteoporosis without current pathological fracture: Secondary | ICD-10-CM | POA: Diagnosis not present

## 2021-11-25 DIAGNOSIS — F32A Depression, unspecified: Secondary | ICD-10-CM | POA: Diagnosis not present

## 2021-11-25 DIAGNOSIS — E039 Hypothyroidism, unspecified: Secondary | ICD-10-CM | POA: Diagnosis not present

## 2021-11-26 DIAGNOSIS — Z79899 Other long term (current) drug therapy: Secondary | ICD-10-CM | POA: Diagnosis not present

## 2021-11-26 DIAGNOSIS — I480 Paroxysmal atrial fibrillation: Secondary | ICD-10-CM | POA: Diagnosis not present

## 2021-11-26 DIAGNOSIS — R053 Chronic cough: Secondary | ICD-10-CM | POA: Diagnosis not present

## 2021-11-26 DIAGNOSIS — K219 Gastro-esophageal reflux disease without esophagitis: Secondary | ICD-10-CM | POA: Diagnosis not present

## 2021-11-26 DIAGNOSIS — B3742 Candidal balanitis: Secondary | ICD-10-CM | POA: Diagnosis not present

## 2021-11-26 DIAGNOSIS — G20 Parkinson's disease: Secondary | ICD-10-CM | POA: Diagnosis not present

## 2021-11-26 DIAGNOSIS — F419 Anxiety disorder, unspecified: Secondary | ICD-10-CM | POA: Diagnosis not present

## 2021-11-26 DIAGNOSIS — R7301 Impaired fasting glucose: Secondary | ICD-10-CM | POA: Diagnosis not present

## 2021-11-27 ENCOUNTER — Other Ambulatory Visit: Payer: Self-pay | Admitting: Neurology

## 2021-12-02 DIAGNOSIS — F32A Depression, unspecified: Secondary | ICD-10-CM | POA: Diagnosis not present

## 2021-12-02 DIAGNOSIS — R634 Abnormal weight loss: Secondary | ICD-10-CM | POA: Diagnosis not present

## 2021-12-02 DIAGNOSIS — G20 Parkinson's disease: Secondary | ICD-10-CM | POA: Diagnosis not present

## 2021-12-02 DIAGNOSIS — M810 Age-related osteoporosis without current pathological fracture: Secondary | ICD-10-CM | POA: Diagnosis not present

## 2021-12-02 DIAGNOSIS — E039 Hypothyroidism, unspecified: Secondary | ICD-10-CM | POA: Diagnosis not present

## 2021-12-02 DIAGNOSIS — F419 Anxiety disorder, unspecified: Secondary | ICD-10-CM | POA: Diagnosis not present

## 2021-12-08 DIAGNOSIS — F419 Anxiety disorder, unspecified: Secondary | ICD-10-CM | POA: Diagnosis not present

## 2021-12-08 DIAGNOSIS — R634 Abnormal weight loss: Secondary | ICD-10-CM | POA: Diagnosis not present

## 2021-12-08 DIAGNOSIS — F32A Depression, unspecified: Secondary | ICD-10-CM | POA: Diagnosis not present

## 2021-12-08 DIAGNOSIS — M810 Age-related osteoporosis without current pathological fracture: Secondary | ICD-10-CM | POA: Diagnosis not present

## 2021-12-08 DIAGNOSIS — E039 Hypothyroidism, unspecified: Secondary | ICD-10-CM | POA: Diagnosis not present

## 2021-12-08 DIAGNOSIS — G20 Parkinson's disease: Secondary | ICD-10-CM | POA: Diagnosis not present

## 2021-12-14 DIAGNOSIS — Z7982 Long term (current) use of aspirin: Secondary | ICD-10-CM | POA: Diagnosis not present

## 2021-12-14 DIAGNOSIS — Z8546 Personal history of malignant neoplasm of prostate: Secondary | ICD-10-CM | POA: Diagnosis not present

## 2021-12-14 DIAGNOSIS — K219 Gastro-esophageal reflux disease without esophagitis: Secondary | ICD-10-CM | POA: Diagnosis not present

## 2021-12-14 DIAGNOSIS — I480 Paroxysmal atrial fibrillation: Secondary | ICD-10-CM | POA: Diagnosis not present

## 2021-12-14 DIAGNOSIS — E7800 Pure hypercholesterolemia, unspecified: Secondary | ICD-10-CM | POA: Diagnosis not present

## 2021-12-14 DIAGNOSIS — Z9181 History of falling: Secondary | ICD-10-CM | POA: Diagnosis not present

## 2021-12-14 DIAGNOSIS — Z6821 Body mass index (BMI) 21.0-21.9, adult: Secondary | ICD-10-CM | POA: Diagnosis not present

## 2021-12-14 DIAGNOSIS — R053 Chronic cough: Secondary | ICD-10-CM | POA: Diagnosis not present

## 2021-12-14 DIAGNOSIS — Z96653 Presence of artificial knee joint, bilateral: Secondary | ICD-10-CM | POA: Diagnosis not present

## 2021-12-14 DIAGNOSIS — F32A Depression, unspecified: Secondary | ICD-10-CM | POA: Diagnosis not present

## 2021-12-14 DIAGNOSIS — E039 Hypothyroidism, unspecified: Secondary | ICD-10-CM | POA: Diagnosis not present

## 2021-12-14 DIAGNOSIS — R634 Abnormal weight loss: Secondary | ICD-10-CM | POA: Diagnosis not present

## 2021-12-14 DIAGNOSIS — G20 Parkinson's disease: Secondary | ICD-10-CM | POA: Diagnosis not present

## 2021-12-14 DIAGNOSIS — M810 Age-related osteoporosis without current pathological fracture: Secondary | ICD-10-CM | POA: Diagnosis not present

## 2021-12-14 DIAGNOSIS — H9193 Unspecified hearing loss, bilateral: Secondary | ICD-10-CM | POA: Diagnosis not present

## 2021-12-14 DIAGNOSIS — Z96642 Presence of left artificial hip joint: Secondary | ICD-10-CM | POA: Diagnosis not present

## 2021-12-14 DIAGNOSIS — F419 Anxiety disorder, unspecified: Secondary | ICD-10-CM | POA: Diagnosis not present

## 2021-12-14 DIAGNOSIS — N400 Enlarged prostate without lower urinary tract symptoms: Secondary | ICD-10-CM | POA: Diagnosis not present

## 2021-12-16 DIAGNOSIS — F419 Anxiety disorder, unspecified: Secondary | ICD-10-CM | POA: Diagnosis not present

## 2021-12-16 DIAGNOSIS — F32A Depression, unspecified: Secondary | ICD-10-CM | POA: Diagnosis not present

## 2021-12-16 DIAGNOSIS — M810 Age-related osteoporosis without current pathological fracture: Secondary | ICD-10-CM | POA: Diagnosis not present

## 2021-12-16 DIAGNOSIS — R634 Abnormal weight loss: Secondary | ICD-10-CM | POA: Diagnosis not present

## 2021-12-16 DIAGNOSIS — E039 Hypothyroidism, unspecified: Secondary | ICD-10-CM | POA: Diagnosis not present

## 2021-12-16 DIAGNOSIS — G20 Parkinson's disease: Secondary | ICD-10-CM | POA: Diagnosis not present

## 2021-12-23 DIAGNOSIS — F32A Depression, unspecified: Secondary | ICD-10-CM | POA: Diagnosis not present

## 2021-12-23 DIAGNOSIS — G20 Parkinson's disease: Secondary | ICD-10-CM | POA: Diagnosis not present

## 2021-12-23 DIAGNOSIS — M810 Age-related osteoporosis without current pathological fracture: Secondary | ICD-10-CM | POA: Diagnosis not present

## 2021-12-23 DIAGNOSIS — R634 Abnormal weight loss: Secondary | ICD-10-CM | POA: Diagnosis not present

## 2021-12-23 DIAGNOSIS — E039 Hypothyroidism, unspecified: Secondary | ICD-10-CM | POA: Diagnosis not present

## 2021-12-23 DIAGNOSIS — F419 Anxiety disorder, unspecified: Secondary | ICD-10-CM | POA: Diagnosis not present

## 2022-01-01 DIAGNOSIS — F419 Anxiety disorder, unspecified: Secondary | ICD-10-CM | POA: Diagnosis not present

## 2022-01-01 DIAGNOSIS — R634 Abnormal weight loss: Secondary | ICD-10-CM | POA: Diagnosis not present

## 2022-01-01 DIAGNOSIS — M810 Age-related osteoporosis without current pathological fracture: Secondary | ICD-10-CM | POA: Diagnosis not present

## 2022-01-01 DIAGNOSIS — G20 Parkinson's disease: Secondary | ICD-10-CM | POA: Diagnosis not present

## 2022-01-01 DIAGNOSIS — F32A Depression, unspecified: Secondary | ICD-10-CM | POA: Diagnosis not present

## 2022-01-01 DIAGNOSIS — E039 Hypothyroidism, unspecified: Secondary | ICD-10-CM | POA: Diagnosis not present

## 2022-01-07 DIAGNOSIS — R634 Abnormal weight loss: Secondary | ICD-10-CM | POA: Diagnosis not present

## 2022-01-07 DIAGNOSIS — F32A Depression, unspecified: Secondary | ICD-10-CM | POA: Diagnosis not present

## 2022-01-07 DIAGNOSIS — G20 Parkinson's disease: Secondary | ICD-10-CM | POA: Diagnosis not present

## 2022-01-07 DIAGNOSIS — E039 Hypothyroidism, unspecified: Secondary | ICD-10-CM | POA: Diagnosis not present

## 2022-01-07 DIAGNOSIS — F419 Anxiety disorder, unspecified: Secondary | ICD-10-CM | POA: Diagnosis not present

## 2022-01-07 DIAGNOSIS — M810 Age-related osteoporosis without current pathological fracture: Secondary | ICD-10-CM | POA: Diagnosis not present

## 2022-01-26 DIAGNOSIS — D485 Neoplasm of uncertain behavior of skin: Secondary | ICD-10-CM | POA: Diagnosis not present

## 2022-01-26 DIAGNOSIS — C44311 Basal cell carcinoma of skin of nose: Secondary | ICD-10-CM | POA: Diagnosis not present

## 2022-02-12 DIAGNOSIS — R296 Repeated falls: Secondary | ICD-10-CM | POA: Diagnosis not present

## 2022-02-12 DIAGNOSIS — M12812 Other specific arthropathies, not elsewhere classified, left shoulder: Secondary | ICD-10-CM | POA: Diagnosis not present

## 2022-02-12 DIAGNOSIS — M25512 Pain in left shoulder: Secondary | ICD-10-CM | POA: Diagnosis not present

## 2022-02-12 DIAGNOSIS — R2689 Other abnormalities of gait and mobility: Secondary | ICD-10-CM | POA: Diagnosis not present

## 2022-02-12 DIAGNOSIS — G20 Parkinson's disease: Secondary | ICD-10-CM | POA: Diagnosis not present

## 2022-02-12 DIAGNOSIS — M25532 Pain in left wrist: Secondary | ICD-10-CM | POA: Diagnosis not present

## 2022-03-09 DIAGNOSIS — E039 Hypothyroidism, unspecified: Secondary | ICD-10-CM | POA: Diagnosis not present

## 2022-03-09 DIAGNOSIS — Z8679 Personal history of other diseases of the circulatory system: Secondary | ICD-10-CM | POA: Diagnosis not present

## 2022-03-09 DIAGNOSIS — Z9889 Other specified postprocedural states: Secondary | ICD-10-CM | POA: Diagnosis not present

## 2022-03-09 DIAGNOSIS — D649 Anemia, unspecified: Secondary | ICD-10-CM | POA: Diagnosis not present

## 2022-03-09 DIAGNOSIS — I4891 Unspecified atrial fibrillation: Secondary | ICD-10-CM | POA: Diagnosis not present

## 2022-03-09 DIAGNOSIS — Z79899 Other long term (current) drug therapy: Secondary | ICD-10-CM | POA: Diagnosis not present

## 2022-03-09 DIAGNOSIS — G20 Parkinson's disease: Secondary | ICD-10-CM | POA: Diagnosis not present

## 2022-04-16 DIAGNOSIS — C61 Malignant neoplasm of prostate: Secondary | ICD-10-CM | POA: Diagnosis not present

## 2022-04-29 DIAGNOSIS — R053 Chronic cough: Secondary | ICD-10-CM | POA: Diagnosis not present

## 2022-04-29 DIAGNOSIS — R7301 Impaired fasting glucose: Secondary | ICD-10-CM | POA: Diagnosis not present

## 2022-04-29 DIAGNOSIS — F419 Anxiety disorder, unspecified: Secondary | ICD-10-CM | POA: Diagnosis not present

## 2022-04-29 DIAGNOSIS — B372 Candidiasis of skin and nail: Secondary | ICD-10-CM | POA: Diagnosis not present

## 2022-05-29 ENCOUNTER — Other Ambulatory Visit: Payer: Self-pay | Admitting: *Deleted

## 2022-05-29 MED ORDER — PANTOPRAZOLE SODIUM 40 MG PO TBEC: 40.0000 mg | DELAYED_RELEASE_TABLET | Freq: Every day | ORAL | 2 refills | Status: DC

## 2022-05-29 MED ORDER — FAMOTIDINE 20 MG PO TABS: ORAL_TABLET | ORAL | 11 refills | Status: AC

## 2022-07-06 DIAGNOSIS — Z1339 Encounter for screening examination for other mental health and behavioral disorders: Secondary | ICD-10-CM | POA: Diagnosis not present

## 2022-07-06 DIAGNOSIS — R2689 Other abnormalities of gait and mobility: Secondary | ICD-10-CM | POA: Diagnosis not present

## 2022-07-06 DIAGNOSIS — Z0000 Encounter for general adult medical examination without abnormal findings: Secondary | ICD-10-CM | POA: Diagnosis not present

## 2022-07-06 DIAGNOSIS — R251 Tremor, unspecified: Secondary | ICD-10-CM | POA: Diagnosis not present

## 2022-07-06 DIAGNOSIS — Z79899 Other long term (current) drug therapy: Secondary | ICD-10-CM | POA: Diagnosis not present

## 2022-07-06 DIAGNOSIS — G20A1 Parkinson's disease without dyskinesia, without mention of fluctuations: Secondary | ICD-10-CM | POA: Diagnosis not present

## 2022-07-06 DIAGNOSIS — Z1331 Encounter for screening for depression: Secondary | ICD-10-CM | POA: Diagnosis not present

## 2022-07-06 DIAGNOSIS — R7301 Impaired fasting glucose: Secondary | ICD-10-CM | POA: Diagnosis not present

## 2022-07-19 DIAGNOSIS — Z9181 History of falling: Secondary | ICD-10-CM | POA: Diagnosis not present

## 2022-07-19 DIAGNOSIS — Z96653 Presence of artificial knee joint, bilateral: Secondary | ICD-10-CM | POA: Diagnosis not present

## 2022-07-19 DIAGNOSIS — Z7982 Long term (current) use of aspirin: Secondary | ICD-10-CM | POA: Diagnosis not present

## 2022-07-19 DIAGNOSIS — Z993 Dependence on wheelchair: Secondary | ICD-10-CM | POA: Diagnosis not present

## 2022-07-19 DIAGNOSIS — E7800 Pure hypercholesterolemia, unspecified: Secondary | ICD-10-CM | POA: Diagnosis not present

## 2022-07-19 DIAGNOSIS — I480 Paroxysmal atrial fibrillation: Secondary | ICD-10-CM | POA: Diagnosis not present

## 2022-07-19 DIAGNOSIS — N39498 Other specified urinary incontinence: Secondary | ICD-10-CM | POA: Diagnosis not present

## 2022-07-19 DIAGNOSIS — H9193 Unspecified hearing loss, bilateral: Secondary | ICD-10-CM | POA: Diagnosis not present

## 2022-07-19 DIAGNOSIS — N401 Enlarged prostate with lower urinary tract symptoms: Secondary | ICD-10-CM | POA: Diagnosis not present

## 2022-07-19 DIAGNOSIS — G20C Parkinsonism, unspecified: Secondary | ICD-10-CM | POA: Diagnosis not present

## 2022-07-19 DIAGNOSIS — R7301 Impaired fasting glucose: Secondary | ICD-10-CM | POA: Diagnosis not present

## 2022-07-19 DIAGNOSIS — R251 Tremor, unspecified: Secondary | ICD-10-CM | POA: Diagnosis not present

## 2022-07-19 DIAGNOSIS — Z8546 Personal history of malignant neoplasm of prostate: Secondary | ICD-10-CM | POA: Diagnosis not present

## 2022-07-19 DIAGNOSIS — G20A1 Parkinson's disease without dyskinesia, without mention of fluctuations: Secondary | ICD-10-CM | POA: Diagnosis not present

## 2022-07-19 DIAGNOSIS — R410 Disorientation, unspecified: Secondary | ICD-10-CM | POA: Diagnosis not present

## 2022-07-19 DIAGNOSIS — E039 Hypothyroidism, unspecified: Secondary | ICD-10-CM | POA: Diagnosis not present

## 2022-07-19 DIAGNOSIS — M810 Age-related osteoporosis without current pathological fracture: Secondary | ICD-10-CM | POA: Diagnosis not present

## 2022-07-19 DIAGNOSIS — M1990 Unspecified osteoarthritis, unspecified site: Secondary | ICD-10-CM | POA: Diagnosis not present

## 2022-07-23 DIAGNOSIS — G20A1 Parkinson's disease without dyskinesia, without mention of fluctuations: Secondary | ICD-10-CM | POA: Diagnosis not present

## 2022-07-23 DIAGNOSIS — N401 Enlarged prostate with lower urinary tract symptoms: Secondary | ICD-10-CM | POA: Diagnosis not present

## 2022-07-23 DIAGNOSIS — I480 Paroxysmal atrial fibrillation: Secondary | ICD-10-CM | POA: Diagnosis not present

## 2022-07-23 DIAGNOSIS — N39498 Other specified urinary incontinence: Secondary | ICD-10-CM | POA: Diagnosis not present

## 2022-07-23 DIAGNOSIS — E039 Hypothyroidism, unspecified: Secondary | ICD-10-CM | POA: Diagnosis not present

## 2022-07-23 DIAGNOSIS — R251 Tremor, unspecified: Secondary | ICD-10-CM | POA: Diagnosis not present

## 2022-07-29 DIAGNOSIS — R251 Tremor, unspecified: Secondary | ICD-10-CM | POA: Diagnosis not present

## 2022-07-29 DIAGNOSIS — E039 Hypothyroidism, unspecified: Secondary | ICD-10-CM | POA: Diagnosis not present

## 2022-07-29 DIAGNOSIS — N39498 Other specified urinary incontinence: Secondary | ICD-10-CM | POA: Diagnosis not present

## 2022-07-29 DIAGNOSIS — I480 Paroxysmal atrial fibrillation: Secondary | ICD-10-CM | POA: Diagnosis not present

## 2022-07-29 DIAGNOSIS — G20A1 Parkinson's disease without dyskinesia, without mention of fluctuations: Secondary | ICD-10-CM | POA: Diagnosis not present

## 2022-07-29 DIAGNOSIS — N401 Enlarged prostate with lower urinary tract symptoms: Secondary | ICD-10-CM | POA: Diagnosis not present

## 2022-08-04 DIAGNOSIS — N401 Enlarged prostate with lower urinary tract symptoms: Secondary | ICD-10-CM | POA: Diagnosis not present

## 2022-08-04 DIAGNOSIS — G20A1 Parkinson's disease without dyskinesia, without mention of fluctuations: Secondary | ICD-10-CM | POA: Diagnosis not present

## 2022-08-04 DIAGNOSIS — R300 Dysuria: Secondary | ICD-10-CM | POA: Diagnosis not present

## 2022-08-04 DIAGNOSIS — I480 Paroxysmal atrial fibrillation: Secondary | ICD-10-CM | POA: Diagnosis not present

## 2022-08-04 DIAGNOSIS — E039 Hypothyroidism, unspecified: Secondary | ICD-10-CM | POA: Diagnosis not present

## 2022-08-04 DIAGNOSIS — N39498 Other specified urinary incontinence: Secondary | ICD-10-CM | POA: Diagnosis not present

## 2022-08-04 DIAGNOSIS — R251 Tremor, unspecified: Secondary | ICD-10-CM | POA: Diagnosis not present

## 2022-08-05 DIAGNOSIS — R251 Tremor, unspecified: Secondary | ICD-10-CM | POA: Diagnosis not present

## 2022-08-05 DIAGNOSIS — I480 Paroxysmal atrial fibrillation: Secondary | ICD-10-CM | POA: Diagnosis not present

## 2022-08-05 DIAGNOSIS — G20A1 Parkinson's disease without dyskinesia, without mention of fluctuations: Secondary | ICD-10-CM | POA: Diagnosis not present

## 2022-08-05 DIAGNOSIS — E039 Hypothyroidism, unspecified: Secondary | ICD-10-CM | POA: Diagnosis not present

## 2022-08-05 DIAGNOSIS — N401 Enlarged prostate with lower urinary tract symptoms: Secondary | ICD-10-CM | POA: Diagnosis not present

## 2022-08-05 DIAGNOSIS — N39498 Other specified urinary incontinence: Secondary | ICD-10-CM | POA: Diagnosis not present

## 2022-08-06 DIAGNOSIS — E039 Hypothyroidism, unspecified: Secondary | ICD-10-CM | POA: Diagnosis not present

## 2022-08-06 DIAGNOSIS — R251 Tremor, unspecified: Secondary | ICD-10-CM | POA: Diagnosis not present

## 2022-08-06 DIAGNOSIS — N401 Enlarged prostate with lower urinary tract symptoms: Secondary | ICD-10-CM | POA: Diagnosis not present

## 2022-08-06 DIAGNOSIS — I480 Paroxysmal atrial fibrillation: Secondary | ICD-10-CM | POA: Diagnosis not present

## 2022-08-06 DIAGNOSIS — G20A1 Parkinson's disease without dyskinesia, without mention of fluctuations: Secondary | ICD-10-CM | POA: Diagnosis not present

## 2022-08-06 DIAGNOSIS — N39498 Other specified urinary incontinence: Secondary | ICD-10-CM | POA: Diagnosis not present

## 2022-08-11 DIAGNOSIS — G20A1 Parkinson's disease without dyskinesia, without mention of fluctuations: Secondary | ICD-10-CM | POA: Diagnosis not present

## 2022-08-11 DIAGNOSIS — N401 Enlarged prostate with lower urinary tract symptoms: Secondary | ICD-10-CM | POA: Diagnosis not present

## 2022-08-11 DIAGNOSIS — I480 Paroxysmal atrial fibrillation: Secondary | ICD-10-CM | POA: Diagnosis not present

## 2022-08-11 DIAGNOSIS — E039 Hypothyroidism, unspecified: Secondary | ICD-10-CM | POA: Diagnosis not present

## 2022-08-11 DIAGNOSIS — N39498 Other specified urinary incontinence: Secondary | ICD-10-CM | POA: Diagnosis not present

## 2022-08-11 DIAGNOSIS — R251 Tremor, unspecified: Secondary | ICD-10-CM | POA: Diagnosis not present

## 2022-08-12 DIAGNOSIS — I480 Paroxysmal atrial fibrillation: Secondary | ICD-10-CM | POA: Diagnosis not present

## 2022-08-12 DIAGNOSIS — N39498 Other specified urinary incontinence: Secondary | ICD-10-CM | POA: Diagnosis not present

## 2022-08-12 DIAGNOSIS — G20A1 Parkinson's disease without dyskinesia, without mention of fluctuations: Secondary | ICD-10-CM | POA: Diagnosis not present

## 2022-08-12 DIAGNOSIS — N401 Enlarged prostate with lower urinary tract symptoms: Secondary | ICD-10-CM | POA: Diagnosis not present

## 2022-08-12 DIAGNOSIS — E039 Hypothyroidism, unspecified: Secondary | ICD-10-CM | POA: Diagnosis not present

## 2022-08-12 DIAGNOSIS — R251 Tremor, unspecified: Secondary | ICD-10-CM | POA: Diagnosis not present

## 2022-08-13 DIAGNOSIS — G20A1 Parkinson's disease without dyskinesia, without mention of fluctuations: Secondary | ICD-10-CM | POA: Diagnosis not present

## 2022-08-13 DIAGNOSIS — R251 Tremor, unspecified: Secondary | ICD-10-CM | POA: Diagnosis not present

## 2022-08-13 DIAGNOSIS — I480 Paroxysmal atrial fibrillation: Secondary | ICD-10-CM | POA: Diagnosis not present

## 2022-08-13 DIAGNOSIS — N39498 Other specified urinary incontinence: Secondary | ICD-10-CM | POA: Diagnosis not present

## 2022-08-13 DIAGNOSIS — N401 Enlarged prostate with lower urinary tract symptoms: Secondary | ICD-10-CM | POA: Diagnosis not present

## 2022-08-13 DIAGNOSIS — E039 Hypothyroidism, unspecified: Secondary | ICD-10-CM | POA: Diagnosis not present

## 2022-08-18 DIAGNOSIS — M810 Age-related osteoporosis without current pathological fracture: Secondary | ICD-10-CM | POA: Diagnosis not present

## 2022-08-18 DIAGNOSIS — G20A1 Parkinson's disease without dyskinesia, without mention of fluctuations: Secondary | ICD-10-CM | POA: Diagnosis not present

## 2022-08-18 DIAGNOSIS — N401 Enlarged prostate with lower urinary tract symptoms: Secondary | ICD-10-CM | POA: Diagnosis not present

## 2022-08-18 DIAGNOSIS — E7800 Pure hypercholesterolemia, unspecified: Secondary | ICD-10-CM | POA: Diagnosis not present

## 2022-08-18 DIAGNOSIS — R410 Disorientation, unspecified: Secondary | ICD-10-CM | POA: Diagnosis not present

## 2022-08-18 DIAGNOSIS — R7301 Impaired fasting glucose: Secondary | ICD-10-CM | POA: Diagnosis not present

## 2022-08-18 DIAGNOSIS — Z7982 Long term (current) use of aspirin: Secondary | ICD-10-CM | POA: Diagnosis not present

## 2022-08-18 DIAGNOSIS — Z9181 History of falling: Secondary | ICD-10-CM | POA: Diagnosis not present

## 2022-08-18 DIAGNOSIS — M1990 Unspecified osteoarthritis, unspecified site: Secondary | ICD-10-CM | POA: Diagnosis not present

## 2022-08-18 DIAGNOSIS — N39498 Other specified urinary incontinence: Secondary | ICD-10-CM | POA: Diagnosis not present

## 2022-08-18 DIAGNOSIS — E039 Hypothyroidism, unspecified: Secondary | ICD-10-CM | POA: Diagnosis not present

## 2022-08-18 DIAGNOSIS — Z8546 Personal history of malignant neoplasm of prostate: Secondary | ICD-10-CM | POA: Diagnosis not present

## 2022-08-18 DIAGNOSIS — Z993 Dependence on wheelchair: Secondary | ICD-10-CM | POA: Diagnosis not present

## 2022-08-18 DIAGNOSIS — R251 Tremor, unspecified: Secondary | ICD-10-CM | POA: Diagnosis not present

## 2022-08-18 DIAGNOSIS — Z96653 Presence of artificial knee joint, bilateral: Secondary | ICD-10-CM | POA: Diagnosis not present

## 2022-08-18 DIAGNOSIS — I480 Paroxysmal atrial fibrillation: Secondary | ICD-10-CM | POA: Diagnosis not present

## 2022-08-18 DIAGNOSIS — H9193 Unspecified hearing loss, bilateral: Secondary | ICD-10-CM | POA: Diagnosis not present

## 2022-08-18 DIAGNOSIS — G20C Parkinsonism, unspecified: Secondary | ICD-10-CM | POA: Diagnosis not present

## 2022-08-20 DIAGNOSIS — N39498 Other specified urinary incontinence: Secondary | ICD-10-CM | POA: Diagnosis not present

## 2022-08-20 DIAGNOSIS — E039 Hypothyroidism, unspecified: Secondary | ICD-10-CM | POA: Diagnosis not present

## 2022-08-20 DIAGNOSIS — R251 Tremor, unspecified: Secondary | ICD-10-CM | POA: Diagnosis not present

## 2022-08-20 DIAGNOSIS — N401 Enlarged prostate with lower urinary tract symptoms: Secondary | ICD-10-CM | POA: Diagnosis not present

## 2022-08-20 DIAGNOSIS — G20A1 Parkinson's disease without dyskinesia, without mention of fluctuations: Secondary | ICD-10-CM | POA: Diagnosis not present

## 2022-08-20 DIAGNOSIS — I480 Paroxysmal atrial fibrillation: Secondary | ICD-10-CM | POA: Diagnosis not present

## 2022-08-21 DIAGNOSIS — E039 Hypothyroidism, unspecified: Secondary | ICD-10-CM | POA: Diagnosis not present

## 2022-08-21 DIAGNOSIS — N39498 Other specified urinary incontinence: Secondary | ICD-10-CM | POA: Diagnosis not present

## 2022-08-21 DIAGNOSIS — G20A1 Parkinson's disease without dyskinesia, without mention of fluctuations: Secondary | ICD-10-CM | POA: Diagnosis not present

## 2022-08-21 DIAGNOSIS — I480 Paroxysmal atrial fibrillation: Secondary | ICD-10-CM | POA: Diagnosis not present

## 2022-08-21 DIAGNOSIS — N401 Enlarged prostate with lower urinary tract symptoms: Secondary | ICD-10-CM | POA: Diagnosis not present

## 2022-08-21 DIAGNOSIS — R251 Tremor, unspecified: Secondary | ICD-10-CM | POA: Diagnosis not present

## 2022-08-26 DIAGNOSIS — G20A1 Parkinson's disease without dyskinesia, without mention of fluctuations: Secondary | ICD-10-CM | POA: Diagnosis not present

## 2022-08-26 DIAGNOSIS — E039 Hypothyroidism, unspecified: Secondary | ICD-10-CM | POA: Diagnosis not present

## 2022-08-26 DIAGNOSIS — I480 Paroxysmal atrial fibrillation: Secondary | ICD-10-CM | POA: Diagnosis not present

## 2022-08-26 DIAGNOSIS — R251 Tremor, unspecified: Secondary | ICD-10-CM | POA: Diagnosis not present

## 2022-08-26 DIAGNOSIS — N401 Enlarged prostate with lower urinary tract symptoms: Secondary | ICD-10-CM | POA: Diagnosis not present

## 2022-08-26 DIAGNOSIS — N39498 Other specified urinary incontinence: Secondary | ICD-10-CM | POA: Diagnosis not present

## 2022-08-27 DIAGNOSIS — R251 Tremor, unspecified: Secondary | ICD-10-CM | POA: Diagnosis not present

## 2022-08-27 DIAGNOSIS — N39498 Other specified urinary incontinence: Secondary | ICD-10-CM | POA: Diagnosis not present

## 2022-08-27 DIAGNOSIS — G20A1 Parkinson's disease without dyskinesia, without mention of fluctuations: Secondary | ICD-10-CM | POA: Diagnosis not present

## 2022-08-27 DIAGNOSIS — I480 Paroxysmal atrial fibrillation: Secondary | ICD-10-CM | POA: Diagnosis not present

## 2022-08-27 DIAGNOSIS — N401 Enlarged prostate with lower urinary tract symptoms: Secondary | ICD-10-CM | POA: Diagnosis not present

## 2022-08-27 DIAGNOSIS — E039 Hypothyroidism, unspecified: Secondary | ICD-10-CM | POA: Diagnosis not present

## 2022-08-28 DIAGNOSIS — N39498 Other specified urinary incontinence: Secondary | ICD-10-CM | POA: Diagnosis not present

## 2022-08-28 DIAGNOSIS — N401 Enlarged prostate with lower urinary tract symptoms: Secondary | ICD-10-CM | POA: Diagnosis not present

## 2022-08-28 DIAGNOSIS — E039 Hypothyroidism, unspecified: Secondary | ICD-10-CM | POA: Diagnosis not present

## 2022-08-28 DIAGNOSIS — G20A1 Parkinson's disease without dyskinesia, without mention of fluctuations: Secondary | ICD-10-CM | POA: Diagnosis not present

## 2022-08-28 DIAGNOSIS — R251 Tremor, unspecified: Secondary | ICD-10-CM | POA: Diagnosis not present

## 2022-08-28 DIAGNOSIS — I480 Paroxysmal atrial fibrillation: Secondary | ICD-10-CM | POA: Diagnosis not present

## 2022-08-31 ENCOUNTER — Other Ambulatory Visit: Payer: Self-pay | Admitting: Internal Medicine

## 2022-09-01 DIAGNOSIS — G20A1 Parkinson's disease without dyskinesia, without mention of fluctuations: Secondary | ICD-10-CM | POA: Diagnosis not present

## 2022-09-01 DIAGNOSIS — R251 Tremor, unspecified: Secondary | ICD-10-CM | POA: Diagnosis not present

## 2022-09-01 DIAGNOSIS — N401 Enlarged prostate with lower urinary tract symptoms: Secondary | ICD-10-CM | POA: Diagnosis not present

## 2022-09-01 DIAGNOSIS — E039 Hypothyroidism, unspecified: Secondary | ICD-10-CM | POA: Diagnosis not present

## 2022-09-01 DIAGNOSIS — I480 Paroxysmal atrial fibrillation: Secondary | ICD-10-CM | POA: Diagnosis not present

## 2022-09-01 DIAGNOSIS — N39498 Other specified urinary incontinence: Secondary | ICD-10-CM | POA: Diagnosis not present

## 2022-09-03 DIAGNOSIS — G20A1 Parkinson's disease without dyskinesia, without mention of fluctuations: Secondary | ICD-10-CM | POA: Diagnosis not present

## 2022-09-03 DIAGNOSIS — R251 Tremor, unspecified: Secondary | ICD-10-CM | POA: Diagnosis not present

## 2022-09-03 DIAGNOSIS — I480 Paroxysmal atrial fibrillation: Secondary | ICD-10-CM | POA: Diagnosis not present

## 2022-09-03 DIAGNOSIS — E039 Hypothyroidism, unspecified: Secondary | ICD-10-CM | POA: Diagnosis not present

## 2022-09-03 DIAGNOSIS — N401 Enlarged prostate with lower urinary tract symptoms: Secondary | ICD-10-CM | POA: Diagnosis not present

## 2022-09-03 DIAGNOSIS — N39498 Other specified urinary incontinence: Secondary | ICD-10-CM | POA: Diagnosis not present

## 2022-09-10 DIAGNOSIS — G20A1 Parkinson's disease without dyskinesia, without mention of fluctuations: Secondary | ICD-10-CM | POA: Diagnosis not present

## 2022-09-10 DIAGNOSIS — N401 Enlarged prostate with lower urinary tract symptoms: Secondary | ICD-10-CM | POA: Diagnosis not present

## 2022-09-10 DIAGNOSIS — R251 Tremor, unspecified: Secondary | ICD-10-CM | POA: Diagnosis not present

## 2022-09-10 DIAGNOSIS — E039 Hypothyroidism, unspecified: Secondary | ICD-10-CM | POA: Diagnosis not present

## 2022-09-10 DIAGNOSIS — I480 Paroxysmal atrial fibrillation: Secondary | ICD-10-CM | POA: Diagnosis not present

## 2022-09-10 DIAGNOSIS — N39498 Other specified urinary incontinence: Secondary | ICD-10-CM | POA: Diagnosis not present

## 2022-09-11 ENCOUNTER — Telehealth: Payer: Self-pay | Admitting: Internal Medicine

## 2022-09-11 DIAGNOSIS — Z1330 Encounter for screening examination for mental health and behavioral disorders, unspecified: Secondary | ICD-10-CM | POA: Diagnosis not present

## 2022-09-11 DIAGNOSIS — I4891 Unspecified atrial fibrillation: Secondary | ICD-10-CM | POA: Diagnosis not present

## 2022-09-11 DIAGNOSIS — E039 Hypothyroidism, unspecified: Secondary | ICD-10-CM | POA: Diagnosis not present

## 2022-09-11 DIAGNOSIS — D649 Anemia, unspecified: Secondary | ICD-10-CM | POA: Diagnosis not present

## 2022-09-11 DIAGNOSIS — Z79899 Other long term (current) drug therapy: Secondary | ICD-10-CM | POA: Diagnosis not present

## 2022-09-11 DIAGNOSIS — Z9889 Other specified postprocedural states: Secondary | ICD-10-CM | POA: Diagnosis not present

## 2022-09-11 DIAGNOSIS — G20C Parkinsonism, unspecified: Secondary | ICD-10-CM | POA: Diagnosis not present

## 2022-09-11 DIAGNOSIS — Z8679 Personal history of other diseases of the circulatory system: Secondary | ICD-10-CM | POA: Diagnosis not present

## 2022-09-11 NOTE — Telephone Encounter (Signed)
ATC X1 LVM for patient to call the office back. Please advise refills on pantoprazole was sent to pharmacy on 08/31/2022

## 2022-09-11 NOTE — Telephone Encounter (Signed)
  Pantoprazole Sodium 40 MG  need more refills sent to pharmacy

## 2022-09-11 NOTE — Telephone Encounter (Signed)
Patient's daughter calling back. Informed refill was sent in on 08/31/2022- she will check with other caregiver and will call back if there is a problem.

## 2022-09-14 DIAGNOSIS — I480 Paroxysmal atrial fibrillation: Secondary | ICD-10-CM | POA: Diagnosis not present

## 2022-09-14 DIAGNOSIS — R251 Tremor, unspecified: Secondary | ICD-10-CM | POA: Diagnosis not present

## 2022-09-14 DIAGNOSIS — N39498 Other specified urinary incontinence: Secondary | ICD-10-CM | POA: Diagnosis not present

## 2022-09-14 DIAGNOSIS — N401 Enlarged prostate with lower urinary tract symptoms: Secondary | ICD-10-CM | POA: Diagnosis not present

## 2022-09-14 DIAGNOSIS — E039 Hypothyroidism, unspecified: Secondary | ICD-10-CM | POA: Diagnosis not present

## 2022-09-14 DIAGNOSIS — G20A1 Parkinson's disease without dyskinesia, without mention of fluctuations: Secondary | ICD-10-CM | POA: Diagnosis not present

## 2022-09-15 DIAGNOSIS — R251 Tremor, unspecified: Secondary | ICD-10-CM | POA: Diagnosis not present

## 2022-09-15 DIAGNOSIS — G20A1 Parkinson's disease without dyskinesia, without mention of fluctuations: Secondary | ICD-10-CM | POA: Diagnosis not present

## 2022-09-15 DIAGNOSIS — I480 Paroxysmal atrial fibrillation: Secondary | ICD-10-CM | POA: Diagnosis not present

## 2022-09-15 DIAGNOSIS — E039 Hypothyroidism, unspecified: Secondary | ICD-10-CM | POA: Diagnosis not present

## 2022-09-15 DIAGNOSIS — N39498 Other specified urinary incontinence: Secondary | ICD-10-CM | POA: Diagnosis not present

## 2022-09-15 DIAGNOSIS — N401 Enlarged prostate with lower urinary tract symptoms: Secondary | ICD-10-CM | POA: Diagnosis not present

## 2022-09-17 DIAGNOSIS — Z7982 Long term (current) use of aspirin: Secondary | ICD-10-CM | POA: Diagnosis not present

## 2022-09-17 DIAGNOSIS — M810 Age-related osteoporosis without current pathological fracture: Secondary | ICD-10-CM | POA: Diagnosis not present

## 2022-09-17 DIAGNOSIS — Z96653 Presence of artificial knee joint, bilateral: Secondary | ICD-10-CM | POA: Diagnosis not present

## 2022-09-17 DIAGNOSIS — N39498 Other specified urinary incontinence: Secondary | ICD-10-CM | POA: Diagnosis not present

## 2022-09-17 DIAGNOSIS — G20C Parkinsonism, unspecified: Secondary | ICD-10-CM | POA: Diagnosis not present

## 2022-09-17 DIAGNOSIS — Z9849 Cataract extraction status, unspecified eye: Secondary | ICD-10-CM | POA: Diagnosis not present

## 2022-09-17 DIAGNOSIS — E039 Hypothyroidism, unspecified: Secondary | ICD-10-CM | POA: Diagnosis not present

## 2022-09-17 DIAGNOSIS — N401 Enlarged prostate with lower urinary tract symptoms: Secondary | ICD-10-CM | POA: Diagnosis not present

## 2022-09-17 DIAGNOSIS — Z8546 Personal history of malignant neoplasm of prostate: Secondary | ICD-10-CM | POA: Diagnosis not present

## 2022-09-17 DIAGNOSIS — M1990 Unspecified osteoarthritis, unspecified site: Secondary | ICD-10-CM | POA: Diagnosis not present

## 2022-09-17 DIAGNOSIS — I480 Paroxysmal atrial fibrillation: Secondary | ICD-10-CM | POA: Diagnosis not present

## 2022-09-17 DIAGNOSIS — Z96642 Presence of left artificial hip joint: Secondary | ICD-10-CM | POA: Diagnosis not present

## 2022-09-17 DIAGNOSIS — Z993 Dependence on wheelchair: Secondary | ICD-10-CM | POA: Diagnosis not present

## 2022-09-17 DIAGNOSIS — H9193 Unspecified hearing loss, bilateral: Secondary | ICD-10-CM | POA: Diagnosis not present

## 2022-09-17 DIAGNOSIS — E7800 Pure hypercholesterolemia, unspecified: Secondary | ICD-10-CM | POA: Diagnosis not present

## 2022-09-17 DIAGNOSIS — R7301 Impaired fasting glucose: Secondary | ICD-10-CM | POA: Diagnosis not present

## 2022-09-24 DIAGNOSIS — E039 Hypothyroidism, unspecified: Secondary | ICD-10-CM | POA: Diagnosis not present

## 2022-09-24 DIAGNOSIS — G20C Parkinsonism, unspecified: Secondary | ICD-10-CM | POA: Diagnosis not present

## 2022-09-24 DIAGNOSIS — N39498 Other specified urinary incontinence: Secondary | ICD-10-CM | POA: Diagnosis not present

## 2022-09-24 DIAGNOSIS — N401 Enlarged prostate with lower urinary tract symptoms: Secondary | ICD-10-CM | POA: Diagnosis not present

## 2022-09-24 DIAGNOSIS — I480 Paroxysmal atrial fibrillation: Secondary | ICD-10-CM | POA: Diagnosis not present

## 2022-09-24 DIAGNOSIS — E7800 Pure hypercholesterolemia, unspecified: Secondary | ICD-10-CM | POA: Diagnosis not present

## 2022-09-29 DIAGNOSIS — E7800 Pure hypercholesterolemia, unspecified: Secondary | ICD-10-CM | POA: Diagnosis not present

## 2022-09-29 DIAGNOSIS — N401 Enlarged prostate with lower urinary tract symptoms: Secondary | ICD-10-CM | POA: Diagnosis not present

## 2022-09-29 DIAGNOSIS — I480 Paroxysmal atrial fibrillation: Secondary | ICD-10-CM | POA: Diagnosis not present

## 2022-09-29 DIAGNOSIS — G20C Parkinsonism, unspecified: Secondary | ICD-10-CM | POA: Diagnosis not present

## 2022-09-29 DIAGNOSIS — N39498 Other specified urinary incontinence: Secondary | ICD-10-CM | POA: Diagnosis not present

## 2022-09-29 DIAGNOSIS — E039 Hypothyroidism, unspecified: Secondary | ICD-10-CM | POA: Diagnosis not present

## 2022-10-01 DIAGNOSIS — G20C Parkinsonism, unspecified: Secondary | ICD-10-CM | POA: Diagnosis not present

## 2022-10-01 DIAGNOSIS — N401 Enlarged prostate with lower urinary tract symptoms: Secondary | ICD-10-CM | POA: Diagnosis not present

## 2022-10-01 DIAGNOSIS — I480 Paroxysmal atrial fibrillation: Secondary | ICD-10-CM | POA: Diagnosis not present

## 2022-10-01 DIAGNOSIS — E7800 Pure hypercholesterolemia, unspecified: Secondary | ICD-10-CM | POA: Diagnosis not present

## 2022-10-01 DIAGNOSIS — N39498 Other specified urinary incontinence: Secondary | ICD-10-CM | POA: Diagnosis not present

## 2022-10-01 DIAGNOSIS — E039 Hypothyroidism, unspecified: Secondary | ICD-10-CM | POA: Diagnosis not present

## 2022-10-02 NOTE — Progress Notes (Unsigned)
Assessment/Plan:   1.  Parkinsons Disease  -Take carbidopa/levodopa, 2 at 8am/noon/4pm  -Take carbidopa/levodopa 50/200 cr at bed  -Discussed follow-up with patient and family.  Until today, I had not seen the patient in 2 years.  -would like to see PT more than 1 time per week ([facility])  -daughter asks about inbrija but don't think that will be helpful right now  -daughter asks about Parkinsons Disease dyskinesia.  Not bothersome to patient.  Discussed nature and pathophysiollogy.  I would not change anything in that regard.    -daughter asks about more information and education.  Told her that support groups/caregiver support group would be beneficial.  She isn't interested.  Told her that it would be beneficial for her/him to make sure that he f/u more than q 2 years as well.  2.  Memory change, likely PDD  -Patient with 24 hour/day caregivers in the home.  -Patient's son is healthcare power of attorney   3.  Atrial fibrillation  -Patient is on amiodarone  -Patient not on anticoagulation.    -Patient did have GI bleed and aspirin was stopped.  Subjective:   Nathan Stanley was seen today for Parkinsons disease.  I have not seen him in 2 years.  Patient is with daughter and caregiver who supplemented history.  Pt has had a few falls.  The last was in the bathroom and the feet will turn inward.   They state he had an appt at [facility] but decided not to go.  They stated that he did well until Christmas when he had some type of GI illness and then got very weak.  They had trouble getting some PT but it just got started about 6 weeks ago.  Its only 1 time per week and the therapist told them to otherwise not have the patient walk between therapy visit.  Caregiver does think that it has helped.   He had recently had some hallucinations last week (just 1 time last week).    Current prescribed movement disorder medications: Carbidopa/levodopa 25/100, 1.5 tablets four times per day  (8am/11:30/4:30/8:30 - takes this same as the CR) Carbidopa/levodopa 25/100 CR at bedtime    ALLERGIES:  No Known Allergies  CURRENT MEDICATIONS:  Outpatient Encounter Medications as of 10/06/2022  Medication Sig   amiodarone (PACERONE) 200 MG tablet Take 1 tablet by mouth daily.   aspirin EC 81 MG tablet Take 1 tablet (81 mg total) by mouth daily.   carbidopa-levodopa (SINEMET CR) 50-200 MG tablet Take 1 tablet by mouth at bedtime.   carbidopa-levodopa (SINEMET IR) 25-100 MG tablet Take 2 tablets by mouth 3 (three) times daily. 8am/noon/4pm   diltiazem (DILACOR XR) 180 MG 24 hr capsule Take 180 mg by mouth daily.   escitalopram (LEXAPRO) 10 MG tablet Take 10 mg by mouth daily.   famotidine (PEPCID) 20 MG tablet One after supper   levothyroxine (SYNTHROID) 25 MCG tablet Take 25 mcg by mouth daily.   pantoprazole (PROTONIX) 40 MG tablet Take 1 tablet by mouth daily 30 to 60 minutes before first meal of the day.   [DISCONTINUED] Carbidopa-Levodopa ER (SINEMET CR) 25-100 MG tablet controlled release TAKE ONE TABLET BY MOUTH AT BEDTIME   calcitonin, salmon, (MIACALCIN/FORTICAL) 200 UNIT/ACT nasal spray Place 1 spray into alternate nostrils daily. (Patient not taking: Reported on 10/06/2022)   calcium citrate (CALCITRATE - DOSED IN MG ELEMENTAL CALCIUM) 950 (200 Ca) MG tablet Take 200 mg of elemental calcium by mouth daily. (Patient not  taking: Reported on 10/06/2022)   Cholecalciferol (VITAMIN D-3) 25 MCG (1000 UT) CAPS Take 1 capsule by mouth daily. (Patient not taking: Reported on 10/06/2022)   LORazepam (ATIVAN) 0.5 MG tablet Take 0.5 mg by mouth as needed for anxiety. (Patient not taking: Reported on 10/06/2022)   [DISCONTINUED] carbidopa-levodopa (SINEMET IR) 25-100 MG tablet TAKE 1 AND 1/2 TABLETS BY MOUTH FOUR TIMES A DAY (Patient not taking: Reported on 10/06/2022)   No facility-administered encounter medications on file as of 10/06/2022.    Objective:   PHYSICAL EXAMINATION:    VITALS:    Vitals:   10/06/22 0855  BP: 126/82  Pulse: 64  Weight: 143 lb 6.4 oz (65 kg)     GEN:  The patient appears stated age and is in NAD. HEENT:  Normocephalic, atraumatic.  The mucous membranes are moist. The superficial temporal arteries are without ropiness or tenderness. CV:  RRR Lungs:  CTAB Neck/HEME:  There are no carotid bruits bilaterally.  Neurological examination:  Orientation: The patient is alert and oriented to person, place and time today.   Cranial nerves: There is good facial symmetry with mild facial hypomimia. The speech is fluent and clear. Soft palate rises symmetrically and there is no tongue deviation. Hearing is decreased to conversational tone. Sensation: Sensation is intact to light touch throughout Motor: Strength is at least antigravity x4.  Movement examination: Tone: There is nl tone in the UE bilaterally Abnormal movements: there is mild leg dyskinesia.   Coordination:  There is no decremation with RAM's, but he has a disfigured hand on the right and cannot do RAMs on the right, although he attempts alternation of supination/pronation Gait and Station: The patient pushes off of the transport chair and requires assist out of chair.  He is given a walker and drags the L leg.    Total time spent on today's visit was 31 minutes, including both face-to-face time and nonface-to-face time.  Time included that spent on review of records (prior notes available to me/labs/imaging if pertinent), discussing treatment and goals, answering patient's questions and coordinating care.  Cc:  Geoffry Paradise, MD

## 2022-10-06 ENCOUNTER — Encounter: Payer: Self-pay | Admitting: Neurology

## 2022-10-06 ENCOUNTER — Other Ambulatory Visit: Payer: Self-pay

## 2022-10-06 ENCOUNTER — Ambulatory Visit (INDEPENDENT_AMBULATORY_CARE_PROVIDER_SITE_OTHER): Payer: Medicare Other | Admitting: Neurology

## 2022-10-06 VITALS — BP 126/82 | HR 64 | Wt 143.4 lb

## 2022-10-06 DIAGNOSIS — G20B2 Parkinson's disease with dyskinesia, with fluctuations: Secondary | ICD-10-CM

## 2022-10-06 MED ORDER — CARBIDOPA-LEVODOPA ER 50-200 MG PO TBCR
1.0000 | EXTENDED_RELEASE_TABLET | Freq: Every day | ORAL | 0 refills | Status: AC
Start: 2022-10-06 — End: ?

## 2022-10-06 MED ORDER — CARBIDOPA-LEVODOPA 25-100 MG PO TABS: 2.0000 | ORAL_TABLET | Freq: Three times a day (TID) | ORAL | 1 refills | Status: DC

## 2022-10-06 MED ORDER — CARBIDOPA-LEVODOPA ER 50-200 MG PO TBCR: 1.0000 | EXTENDED_RELEASE_TABLET | Freq: Every day | ORAL | 1 refills | Status: DC

## 2022-10-06 MED ORDER — CARBIDOPA-LEVODOPA 25-100 MG PO TABS
2.0000 | ORAL_TABLET | Freq: Three times a day (TID) | ORAL | 0 refills | Status: AC
Start: 2022-10-06 — End: ?

## 2022-10-06 NOTE — Patient Instructions (Addendum)
Take carbidopa/levodopa 25/100, 2 at 8am/noon/4pm Take carbidopa/levodopa 50/200 CR at bed  Local and Online Resources for Power over Parkinson's Group  April 2024   LOCAL [place] PARKINSON'S GROUPS   Power over Parkinson's Group:    Power Over Parkinson's Patient Education Group will be Wednesday, April 10th-*Hybrid meting*- in person at [facility] location and via [facility], 2:00-3:00 pm.   Power over [employer] and Care Partner Groups will meet together, with plans for separate break out session for caregivers, depending on topic/speaker Upcoming Power over Parkinson's Meetings/Care Partner Support:  2nd Wednesdays of the month at 2 pm:   April 10th, May 8th Contact Amy Marriott at [email or web address] if interested in participating in this group    LOCAL EVENTS AND NEW OFFERINGS  NEW:  Parkinson's Social Game Night.  First Thursday of each month, 2:00-4:00 pm.  *Next date is April 4th*.  Rossie Muskrat [employer], [employer].  Contact [email or web address] if interested. Parkinson's CarePartner Group for Men is in the works, if interested email [email or web address] ACT FITNESS Chair Yoga classes "Train and Gain", Fridays 10 am, [employer].  Contact Gina at [phone].  Health visitor Classes offering at [employer]!  TUESDAYS (Chair Yoga)  and Wednesdays (PWR! Moves)  1:00 pm.   Contact Synetta Shadow at  [email or web address]  or [phone]  Drumming for Parkinson's will be held on 2nd and 4th Mondays at 11:00 am.   Located at the [place] of the [employer] ([address] [place].)  Contact Albertina Parr at [email or web address] or [phone]  Dance for Parkinson 's classes will be on Tuesdays 10-11 am. Located in the [employer], in the first floor of the [employer] ([address] To register:  [email or web address] or  [phone] [facility] Parkinson's Tai Chi Class, Mondays at 11 am.  Call [phone] for details Hamil-Kerr Challenge.  Bike, Run, [employer] for [employer].  Saturday, April 6th at [facility].  To register, visit [email or web address] [facility].  Saturday, May 4th, 10 am start.  Register at [email or web address]    ONLINE EDUCATION AND SUPPORT  Parkinson Foundation:  www.parkinson.org  PD Health at Home continues:  Mindfulness Mondays, Wellness Wednesdays, Fitness Fridays  (PWR! Moves as part of Fitness Fridays March 22nd, 1-1:45 pm) Upcoming Education:   Parkinson's 101.  Wednesday, April 3rd, 1-2 pm Movement for Parkinson's.  Wednesday, May 1st, 1-2 pm Expert Briefing:  Research Update:  Working to [employer] PD.  Wednesday, April 10th, 1-2 pm Trouble with Zzz's:  Sleep Challenges with Parkinson's.  Wed, May 8th 1-2 pm Register for virtual education and expert briefings (webinars) at [email or web address].gl Please check out their website to sign up for emails and see their full online offerings     [employer] Foundation:  www.[email or web address]   Third Thursday Webinars:  On the third Thursday of every month at 12 p.m. ET, join our free live webinars to learn about various aspects of living with Parkinson's disease and our work to speed medical breakthroughs.  Upcoming Webinar:  Let's Talk Taboos:  Hard-to-Discuss Parkinson's Symptoms.  Thursday, April 18th at 12 noon. Check out additional information on their website to see their full online offerings    [facility]:  [email or web address]  Upcoming Webinar:   Emergent Therapies.  Wednesday, April 2nd, 4 pm Series:  Living with Parkinson's Meetup.   Third Thursdays each month, 3 pm  Care Partner Monthly Meetup.  With Jillene Bucks  [place].  First Tuesday of each month, 2 pm  Check out additional information to Live Well Today on their  website    Parkinson and Movement Disorders (PMD) Alliance:  www.pmdalliance.org  [employer] Online:  Online Education Events  Sign up for emails, which are sent weekly to give you updates on programming and online offerings    Parkinson's Association of the [place]:  www.parkinsonassociation.org  Information on online support groups, education events, and online exercises including Yoga, Parkinson's exercises and more-LOTS of information on links to PD resources and online events  Virtual Support Group through Parkinson's Association of the [place]; next one is scheduled for Wednesday, April 3rd  MOVEMENT AND EXERCISE OPPORTUNITIES  PWR! Moves Classes at [facility] Exercise Room.  Wednesdays 10 and 11 am.   Contact Amy Marriott, PT [email or web address] if interested.  Parkinson's Exercise Class offerings at [employer]. *TUESDAYS* (Chair yoga) and Wednesdays (PWR! Moves)  1:00 pm.    Contact Synetta Shadow at [email or web address]    Parkinson's Wellness Recovery (PWR! Moves)  www.[email or web address]  Info on the PWR! Virtual Experience:  You will have access to our expertise?through self-assessment, guided plans that start with the PD-specific fundamentals, educational content, tips, Q&A with an expert, and a growing Engineering geologist of PD-specific pre-recorded and live exercise classes of varying types and intensity - both physical and cognitive! If that is not enough, we offer 1:1 wellness consultations (in-person or virtual) to personalize your PWR! Dance movement psychotherapist.   Parkinson [employer] Fridays:   As part of the PD Health @ Home program, this free video series focuses each week on one aspect of fitness designed to support people living with Parkinson's.? These weekly videos highlight the Parkinson Foundation fitness guidelines for people with Parkinson's disease.  MenusLocal.com.[email or web address]  Dance for PD website is offering free,  live-stream classes throughout the week, as well as links to [employer] of classes:  [email or web address]  Virtual dance and Pilates for Parkinson's classes: Click on the Community Tab> Parkinson's Movement Initiative Tab.  To register for classes and for more information, visit www.[email or web address].co.[email or web address] and click the "community" tab.   [employer] Parkinson's Cycling Classes   [facility]:  Thursdays @ Noon-Live classes at [employer] ([employer] at [email or web address]?or [phone])  [employer]: Classes Tuesday, Wednesday and Thursday (contact [place] at [email or web address] ?or [phone])  [facility] [employer]  Varied levels of classes are offered Tuesdays and Thursdays at [employer].   Stretching with Byrd Hesselbach weekly class is also offered for people with Parkinson's  To observe a class or for more information, call [phone] or email Patricia Nettle at [email or web address]   ADDITIONAL SUPPORT AND RESOURCES  [employer]:  Online Caregiver Education Opportunities:  www.well-springsolutions.org/caregiver-education/caregiver-support-group.  You may also contact Loleta Chance at [facility] -spring.[email or web address] or [phone].     [employer] April [facility] Decisions Day:  The Most Critical Legal and Medical Decisions to Consider Now!  Tuesday, April 16th, 1-3 pm at [facility] at [facility].  Contact Loleta Chance at [facility] -spring.org or [phone] Powerful Tools for Caregivers.  6 week educational series for caregivers.  April 18-May 23, 10:30 am-12:15 pm at [facility] 3rd Floor Conference Room.   Contact Loleta Chance at [facility] -spring.org or [phone] to register Well-Spring Navigator:  Just1Navigator program, a?free service to help individuals and families through the journey of determining care for older adults.  The "Navigator" is a  Child psychotherapist, Sidney Ace, who will speak with a prospective client and/or loved ones to provide an  assessment of the situation and a set of recommendations for a personalized care plan -- all free of charge, and whether?Well[employer] offers the needed service or not. If the need is not a service we provide, we are well-connected with reputable programs in town that we can refer you to.  www.well-springsolutions.org or to speak with the Navigator, call [phone].

## 2022-10-08 DIAGNOSIS — E7800 Pure hypercholesterolemia, unspecified: Secondary | ICD-10-CM | POA: Diagnosis not present

## 2022-10-08 DIAGNOSIS — G20C Parkinsonism, unspecified: Secondary | ICD-10-CM | POA: Diagnosis not present

## 2022-10-08 DIAGNOSIS — E039 Hypothyroidism, unspecified: Secondary | ICD-10-CM | POA: Diagnosis not present

## 2022-10-08 DIAGNOSIS — N401 Enlarged prostate with lower urinary tract symptoms: Secondary | ICD-10-CM | POA: Diagnosis not present

## 2022-10-08 DIAGNOSIS — I480 Paroxysmal atrial fibrillation: Secondary | ICD-10-CM | POA: Diagnosis not present

## 2022-10-08 DIAGNOSIS — N39498 Other specified urinary incontinence: Secondary | ICD-10-CM | POA: Diagnosis not present

## 2022-10-14 DIAGNOSIS — E039 Hypothyroidism, unspecified: Secondary | ICD-10-CM | POA: Diagnosis not present

## 2022-10-14 DIAGNOSIS — E7800 Pure hypercholesterolemia, unspecified: Secondary | ICD-10-CM | POA: Diagnosis not present

## 2022-10-14 DIAGNOSIS — I480 Paroxysmal atrial fibrillation: Secondary | ICD-10-CM | POA: Diagnosis not present

## 2022-10-14 DIAGNOSIS — G20C Parkinsonism, unspecified: Secondary | ICD-10-CM | POA: Diagnosis not present

## 2022-10-14 DIAGNOSIS — N401 Enlarged prostate with lower urinary tract symptoms: Secondary | ICD-10-CM | POA: Diagnosis not present

## 2022-10-14 DIAGNOSIS — N39498 Other specified urinary incontinence: Secondary | ICD-10-CM | POA: Diagnosis not present

## 2022-10-15 ENCOUNTER — Telehealth: Payer: Self-pay

## 2022-10-15 NOTE — Telephone Encounter (Signed)
Called and daughter is getting pateint screened tomorrow

## 2022-10-15 NOTE — Telephone Encounter (Signed)
Daughter states Nathan Stanley has had hallucinations all day today and is asking for advice.

## 2022-10-15 NOTE — Telephone Encounter (Signed)
Spoke to patients daughter and patient has been seeing things today like tractor trailer loading up and its not there home health told patiens daughter that all parkinson's patient hallucinate and it is something that is a part of parkinson's to call Dr. Arbutus Leas. I asked patients daughter about a UTI patients daughter said she could get it screened tomorrow if you suggest

## 2022-10-16 DIAGNOSIS — R300 Dysuria: Secondary | ICD-10-CM | POA: Diagnosis not present

## 2022-10-16 NOTE — Telephone Encounter (Signed)
Had front desk call to get patient in to see Maralyn Sago and her response was  daughter said that this is not anything new that they told dr.tat at the last appointment, but he is scheduled for Monday -daughter doesn't know if she can convince him to come.

## 2022-10-17 DIAGNOSIS — M810 Age-related osteoporosis without current pathological fracture: Secondary | ICD-10-CM | POA: Diagnosis not present

## 2022-10-17 DIAGNOSIS — N401 Enlarged prostate with lower urinary tract symptoms: Secondary | ICD-10-CM | POA: Diagnosis not present

## 2022-10-17 DIAGNOSIS — N39498 Other specified urinary incontinence: Secondary | ICD-10-CM | POA: Diagnosis not present

## 2022-10-17 DIAGNOSIS — Z96653 Presence of artificial knee joint, bilateral: Secondary | ICD-10-CM | POA: Diagnosis not present

## 2022-10-17 DIAGNOSIS — R7301 Impaired fasting glucose: Secondary | ICD-10-CM | POA: Diagnosis not present

## 2022-10-17 DIAGNOSIS — H9193 Unspecified hearing loss, bilateral: Secondary | ICD-10-CM | POA: Diagnosis not present

## 2022-10-17 DIAGNOSIS — Z7982 Long term (current) use of aspirin: Secondary | ICD-10-CM | POA: Diagnosis not present

## 2022-10-17 DIAGNOSIS — I480 Paroxysmal atrial fibrillation: Secondary | ICD-10-CM | POA: Diagnosis not present

## 2022-10-17 DIAGNOSIS — G20C Parkinsonism, unspecified: Secondary | ICD-10-CM | POA: Diagnosis not present

## 2022-10-17 DIAGNOSIS — M1990 Unspecified osteoarthritis, unspecified site: Secondary | ICD-10-CM | POA: Diagnosis not present

## 2022-10-17 DIAGNOSIS — Z9849 Cataract extraction status, unspecified eye: Secondary | ICD-10-CM | POA: Diagnosis not present

## 2022-10-17 DIAGNOSIS — Z96642 Presence of left artificial hip joint: Secondary | ICD-10-CM | POA: Diagnosis not present

## 2022-10-17 DIAGNOSIS — E7800 Pure hypercholesterolemia, unspecified: Secondary | ICD-10-CM | POA: Diagnosis not present

## 2022-10-17 DIAGNOSIS — E039 Hypothyroidism, unspecified: Secondary | ICD-10-CM | POA: Diagnosis not present

## 2022-10-17 DIAGNOSIS — Z8546 Personal history of malignant neoplasm of prostate: Secondary | ICD-10-CM | POA: Diagnosis not present

## 2022-10-17 DIAGNOSIS — Z993 Dependence on wheelchair: Secondary | ICD-10-CM | POA: Diagnosis not present

## 2022-10-19 ENCOUNTER — Telehealth: Payer: Self-pay | Admitting: Neurology

## 2022-10-19 ENCOUNTER — Ambulatory Visit: Payer: Medicare Other | Admitting: Physician Assistant

## 2022-10-19 NOTE — Telephone Encounter (Signed)
Pt's daughter called in stating the caregiver didn't show up and she can't get the pt here for the visit with Huntley Dec today. She is requesting that Dr. Arbutus Leas just write the pt a prescription for his hallucinations. She didn't want to reschedule with Huntley Dec. She says it very hard to get the pt into an office visit especially since he was just here.

## 2022-10-19 NOTE — Telephone Encounter (Signed)
The patient needs to be seen for the hallucinations since they told me 1-2 weeks ago at the appt that hallucinations were ONE time only and the medications have significant risks that need to be discussed.  They can reschedule with sara

## 2022-10-19 NOTE — Telephone Encounter (Addendum)
Called patients daughter and she was frustrated with the fact that the patient needs to be seen in order to prescribe medication Patients daughter is not POA patients son is POA and patients daughter has refused treatment at this time for hallucinations

## 2022-10-21 ENCOUNTER — Telehealth: Payer: Self-pay | Admitting: Neurology

## 2022-10-21 DIAGNOSIS — G20C Parkinsonism, unspecified: Secondary | ICD-10-CM | POA: Diagnosis not present

## 2022-10-21 DIAGNOSIS — I480 Paroxysmal atrial fibrillation: Secondary | ICD-10-CM | POA: Diagnosis not present

## 2022-10-21 DIAGNOSIS — E7800 Pure hypercholesterolemia, unspecified: Secondary | ICD-10-CM | POA: Diagnosis not present

## 2022-10-21 DIAGNOSIS — E039 Hypothyroidism, unspecified: Secondary | ICD-10-CM | POA: Diagnosis not present

## 2022-10-21 DIAGNOSIS — N401 Enlarged prostate with lower urinary tract symptoms: Secondary | ICD-10-CM | POA: Diagnosis not present

## 2022-10-21 DIAGNOSIS — N39498 Other specified urinary incontinence: Secondary | ICD-10-CM | POA: Diagnosis not present

## 2022-10-21 NOTE — Telephone Encounter (Signed)
New message    POA Robey - son gave caregiver permission to call the MD office.   Pt c/o medication issue:  1. Name of Medication: carbidopa-levodopa (SINEMET IR) 25-100 MG tablet   2. How are you currently taking this medication (dosage and times per day)? Take 2 tablets by mouth 3 (three) times daily. 8am/noon/4pm   3. Are you having a reaction (difficulty breathing--STAT)? No   4. What is your medication issue? Hallucination is getting worse.
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
## 2022-10-22 DIAGNOSIS — N401 Enlarged prostate with lower urinary tract symptoms: Secondary | ICD-10-CM | POA: Diagnosis not present

## 2022-10-22 DIAGNOSIS — N39498 Other specified urinary incontinence: Secondary | ICD-10-CM | POA: Diagnosis not present

## 2022-10-22 DIAGNOSIS — E039 Hypothyroidism, unspecified: Secondary | ICD-10-CM | POA: Diagnosis not present

## 2022-10-22 DIAGNOSIS — E7800 Pure hypercholesterolemia, unspecified: Secondary | ICD-10-CM | POA: Diagnosis not present

## 2022-10-22 DIAGNOSIS — G20C Parkinsonism, unspecified: Secondary | ICD-10-CM | POA: Diagnosis not present

## 2022-10-22 DIAGNOSIS — I480 Paroxysmal atrial fibrillation: Secondary | ICD-10-CM | POA: Diagnosis not present

## 2022-10-26 ENCOUNTER — Other Ambulatory Visit: Payer: Self-pay | Admitting: Internal Medicine

## 2022-10-27 DIAGNOSIS — N401 Enlarged prostate with lower urinary tract symptoms: Secondary | ICD-10-CM | POA: Diagnosis not present

## 2022-10-27 DIAGNOSIS — I480 Paroxysmal atrial fibrillation: Secondary | ICD-10-CM | POA: Diagnosis not present

## 2022-10-27 DIAGNOSIS — E7800 Pure hypercholesterolemia, unspecified: Secondary | ICD-10-CM | POA: Diagnosis not present

## 2022-10-27 DIAGNOSIS — E039 Hypothyroidism, unspecified: Secondary | ICD-10-CM | POA: Diagnosis not present

## 2022-10-27 DIAGNOSIS — G20C Parkinsonism, unspecified: Secondary | ICD-10-CM | POA: Diagnosis not present

## 2022-10-27 DIAGNOSIS — N39498 Other specified urinary incontinence: Secondary | ICD-10-CM | POA: Diagnosis not present

## 2022-10-29 DIAGNOSIS — N401 Enlarged prostate with lower urinary tract symptoms: Secondary | ICD-10-CM | POA: Diagnosis not present

## 2022-10-29 DIAGNOSIS — N39498 Other specified urinary incontinence: Secondary | ICD-10-CM | POA: Diagnosis not present

## 2022-10-29 DIAGNOSIS — G20C Parkinsonism, unspecified: Secondary | ICD-10-CM | POA: Diagnosis not present

## 2022-10-29 DIAGNOSIS — E7800 Pure hypercholesterolemia, unspecified: Secondary | ICD-10-CM | POA: Diagnosis not present

## 2022-10-29 DIAGNOSIS — I480 Paroxysmal atrial fibrillation: Secondary | ICD-10-CM | POA: Diagnosis not present

## 2022-10-29 DIAGNOSIS — E039 Hypothyroidism, unspecified: Secondary | ICD-10-CM | POA: Diagnosis not present

## 2022-11-03 DIAGNOSIS — G20C Parkinsonism, unspecified: Secondary | ICD-10-CM | POA: Diagnosis not present

## 2022-11-03 DIAGNOSIS — E7800 Pure hypercholesterolemia, unspecified: Secondary | ICD-10-CM | POA: Diagnosis not present

## 2022-11-03 DIAGNOSIS — N401 Enlarged prostate with lower urinary tract symptoms: Secondary | ICD-10-CM | POA: Diagnosis not present

## 2022-11-03 DIAGNOSIS — E039 Hypothyroidism, unspecified: Secondary | ICD-10-CM | POA: Diagnosis not present

## 2022-11-03 DIAGNOSIS — N39498 Other specified urinary incontinence: Secondary | ICD-10-CM | POA: Diagnosis not present

## 2022-11-03 DIAGNOSIS — I480 Paroxysmal atrial fibrillation: Secondary | ICD-10-CM | POA: Diagnosis not present

## 2022-11-05 DIAGNOSIS — N401 Enlarged prostate with lower urinary tract symptoms: Secondary | ICD-10-CM | POA: Diagnosis not present

## 2022-11-05 DIAGNOSIS — I480 Paroxysmal atrial fibrillation: Secondary | ICD-10-CM | POA: Diagnosis not present

## 2022-11-05 DIAGNOSIS — E039 Hypothyroidism, unspecified: Secondary | ICD-10-CM | POA: Diagnosis not present

## 2022-11-05 DIAGNOSIS — E7800 Pure hypercholesterolemia, unspecified: Secondary | ICD-10-CM | POA: Diagnosis not present

## 2022-11-05 DIAGNOSIS — N39498 Other specified urinary incontinence: Secondary | ICD-10-CM | POA: Diagnosis not present

## 2022-11-05 DIAGNOSIS — G20C Parkinsonism, unspecified: Secondary | ICD-10-CM | POA: Diagnosis not present

## 2022-11-10 DIAGNOSIS — N39498 Other specified urinary incontinence: Secondary | ICD-10-CM | POA: Diagnosis not present

## 2022-11-10 DIAGNOSIS — E039 Hypothyroidism, unspecified: Secondary | ICD-10-CM | POA: Diagnosis not present

## 2022-11-10 DIAGNOSIS — I480 Paroxysmal atrial fibrillation: Secondary | ICD-10-CM | POA: Diagnosis not present

## 2022-11-10 DIAGNOSIS — E7800 Pure hypercholesterolemia, unspecified: Secondary | ICD-10-CM | POA: Diagnosis not present

## 2022-11-10 DIAGNOSIS — N401 Enlarged prostate with lower urinary tract symptoms: Secondary | ICD-10-CM | POA: Diagnosis not present

## 2022-11-10 DIAGNOSIS — G20C Parkinsonism, unspecified: Secondary | ICD-10-CM | POA: Diagnosis not present

## 2022-11-12 DIAGNOSIS — N401 Enlarged prostate with lower urinary tract symptoms: Secondary | ICD-10-CM | POA: Diagnosis not present

## 2022-11-12 DIAGNOSIS — E7800 Pure hypercholesterolemia, unspecified: Secondary | ICD-10-CM | POA: Diagnosis not present

## 2022-11-12 DIAGNOSIS — I480 Paroxysmal atrial fibrillation: Secondary | ICD-10-CM | POA: Diagnosis not present

## 2022-11-12 DIAGNOSIS — E039 Hypothyroidism, unspecified: Secondary | ICD-10-CM | POA: Diagnosis not present

## 2022-11-12 DIAGNOSIS — N39498 Other specified urinary incontinence: Secondary | ICD-10-CM | POA: Diagnosis not present

## 2022-11-12 DIAGNOSIS — G20C Parkinsonism, unspecified: Secondary | ICD-10-CM | POA: Diagnosis not present

## 2022-11-30 DIAGNOSIS — Z79899 Other long term (current) drug therapy: Secondary | ICD-10-CM | POA: Diagnosis not present

## 2022-11-30 DIAGNOSIS — G20A1 Parkinson's disease without dyskinesia, without mention of fluctuations: Secondary | ICD-10-CM | POA: Diagnosis not present

## 2022-11-30 DIAGNOSIS — R0981 Nasal congestion: Secondary | ICD-10-CM | POA: Diagnosis not present

## 2022-11-30 DIAGNOSIS — R2689 Other abnormalities of gait and mobility: Secondary | ICD-10-CM | POA: Diagnosis not present

## 2022-11-30 DIAGNOSIS — R7301 Impaired fasting glucose: Secondary | ICD-10-CM | POA: Diagnosis not present

## 2022-11-30 DIAGNOSIS — R251 Tremor, unspecified: Secondary | ICD-10-CM | POA: Diagnosis not present

## 2023-01-13 DIAGNOSIS — I6201 Nontraumatic acute subdural hemorrhage: Secondary | ICD-10-CM | POA: Diagnosis present

## 2023-01-13 DIAGNOSIS — K4090 Unilateral inguinal hernia, without obstruction or gangrene, not specified as recurrent: Secondary | ICD-10-CM | POA: Diagnosis not present

## 2023-01-13 DIAGNOSIS — R918 Other nonspecific abnormal finding of lung field: Secondary | ICD-10-CM | POA: Diagnosis not present

## 2023-01-13 DIAGNOSIS — L89152 Pressure ulcer of sacral region, stage 2: Secondary | ICD-10-CM | POA: Diagnosis present

## 2023-01-13 DIAGNOSIS — A419 Sepsis, unspecified organism: Secondary | ICD-10-CM | POA: Diagnosis not present

## 2023-01-13 DIAGNOSIS — R6520 Severe sepsis without septic shock: Secondary | ICD-10-CM | POA: Diagnosis not present

## 2023-01-13 DIAGNOSIS — K5731 Diverticulosis of large intestine without perforation or abscess with bleeding: Secondary | ICD-10-CM | POA: Diagnosis present

## 2023-01-13 DIAGNOSIS — I480 Paroxysmal atrial fibrillation: Secondary | ICD-10-CM | POA: Diagnosis present

## 2023-01-13 DIAGNOSIS — R58 Hemorrhage, not elsewhere classified: Secondary | ICD-10-CM | POA: Diagnosis not present

## 2023-01-13 DIAGNOSIS — N281 Cyst of kidney, acquired: Secondary | ICD-10-CM | POA: Diagnosis not present

## 2023-01-13 DIAGNOSIS — G20B1 Parkinson's disease with dyskinesia, without mention of fluctuations: Secondary | ICD-10-CM | POA: Diagnosis not present

## 2023-01-13 DIAGNOSIS — R29898 Other symptoms and signs involving the musculoskeletal system: Secondary | ICD-10-CM | POA: Diagnosis not present

## 2023-01-13 DIAGNOSIS — J189 Pneumonia, unspecified organism: Secondary | ICD-10-CM | POA: Diagnosis not present

## 2023-01-13 DIAGNOSIS — I6782 Cerebral ischemia: Secondary | ICD-10-CM | POA: Diagnosis not present

## 2023-01-13 DIAGNOSIS — J9601 Acute respiratory failure with hypoxia: Secondary | ICD-10-CM | POA: Diagnosis not present

## 2023-01-13 DIAGNOSIS — F419 Anxiety disorder, unspecified: Secondary | ICD-10-CM | POA: Diagnosis not present

## 2023-01-13 DIAGNOSIS — G20A1 Parkinson's disease without dyskinesia, without mention of fluctuations: Secondary | ICD-10-CM | POA: Diagnosis present

## 2023-01-13 DIAGNOSIS — L89611 Pressure ulcer of right heel, stage 1: Secondary | ICD-10-CM | POA: Diagnosis present

## 2023-01-13 DIAGNOSIS — Z79899 Other long term (current) drug therapy: Secondary | ICD-10-CM | POA: Diagnosis not present

## 2023-01-13 DIAGNOSIS — I482 Chronic atrial fibrillation, unspecified: Secondary | ICD-10-CM | POA: Diagnosis not present

## 2023-01-13 DIAGNOSIS — K922 Gastrointestinal hemorrhage, unspecified: Secondary | ICD-10-CM | POA: Diagnosis not present

## 2023-01-13 DIAGNOSIS — N179 Acute kidney failure, unspecified: Secondary | ICD-10-CM | POA: Diagnosis not present

## 2023-01-13 DIAGNOSIS — I447 Left bundle-branch block, unspecified: Secondary | ICD-10-CM | POA: Diagnosis not present

## 2023-01-13 DIAGNOSIS — K625 Hemorrhage of anus and rectum: Secondary | ICD-10-CM | POA: Diagnosis not present

## 2023-01-13 DIAGNOSIS — I499 Cardiac arrhythmia, unspecified: Secondary | ICD-10-CM | POA: Diagnosis not present

## 2023-01-13 DIAGNOSIS — F32A Depression, unspecified: Secondary | ICD-10-CM | POA: Diagnosis not present

## 2023-01-13 DIAGNOSIS — E039 Hypothyroidism, unspecified: Secondary | ICD-10-CM | POA: Diagnosis present

## 2023-01-13 DIAGNOSIS — J984 Other disorders of lung: Secondary | ICD-10-CM | POA: Diagnosis not present

## 2023-01-13 DIAGNOSIS — I959 Hypotension, unspecified: Secondary | ICD-10-CM | POA: Diagnosis not present

## 2023-01-13 DIAGNOSIS — R4182 Altered mental status, unspecified: Secondary | ICD-10-CM | POA: Diagnosis not present

## 2023-01-13 DIAGNOSIS — K828 Other specified diseases of gallbladder: Secondary | ICD-10-CM | POA: Diagnosis not present

## 2023-01-13 DIAGNOSIS — K5730 Diverticulosis of large intestine without perforation or abscess without bleeding: Secondary | ICD-10-CM | POA: Diagnosis not present

## 2023-01-13 DIAGNOSIS — K219 Gastro-esophageal reflux disease without esophagitis: Secondary | ICD-10-CM | POA: Diagnosis not present

## 2023-01-13 DIAGNOSIS — R935 Abnormal findings on diagnostic imaging of other abdominal regions, including retroperitoneum: Secondary | ICD-10-CM | POA: Diagnosis not present

## 2023-01-13 DIAGNOSIS — I700 Atherosclerosis of aorta: Secondary | ICD-10-CM | POA: Diagnosis not present

## 2023-01-13 DIAGNOSIS — I723 Aneurysm of iliac artery: Secondary | ICD-10-CM | POA: Diagnosis present

## 2023-01-13 DIAGNOSIS — I609 Nontraumatic subarachnoid hemorrhage, unspecified: Secondary | ICD-10-CM | POA: Diagnosis not present

## 2023-01-13 DIAGNOSIS — F329 Major depressive disorder, single episode, unspecified: Secondary | ICD-10-CM | POA: Diagnosis present

## 2023-01-13 DIAGNOSIS — I6200 Nontraumatic subdural hemorrhage, unspecified: Secondary | ICD-10-CM | POA: Diagnosis not present

## 2023-01-13 DIAGNOSIS — Z7982 Long term (current) use of aspirin: Secondary | ICD-10-CM | POA: Diagnosis not present

## 2023-01-13 DIAGNOSIS — D62 Acute posthemorrhagic anemia: Secondary | ICD-10-CM | POA: Diagnosis present

## 2023-01-13 DIAGNOSIS — Z743 Need for continuous supervision: Secondary | ICD-10-CM | POA: Diagnosis not present

## 2023-01-13 DIAGNOSIS — Z66 Do not resuscitate: Secondary | ICD-10-CM | POA: Diagnosis present

## 2023-01-14 DIAGNOSIS — N179 Acute kidney failure, unspecified: Secondary | ICD-10-CM | POA: Diagnosis not present

## 2023-01-14 DIAGNOSIS — G20B1 Parkinson's disease with dyskinesia, without mention of fluctuations: Secondary | ICD-10-CM | POA: Diagnosis not present

## 2023-01-14 DIAGNOSIS — L89152 Pressure ulcer of sacral region, stage 2: Secondary | ICD-10-CM | POA: Diagnosis present

## 2023-01-14 DIAGNOSIS — J984 Other disorders of lung: Secondary | ICD-10-CM | POA: Diagnosis not present

## 2023-01-14 DIAGNOSIS — Z66 Do not resuscitate: Secondary | ICD-10-CM | POA: Diagnosis present

## 2023-01-14 DIAGNOSIS — K5731 Diverticulosis of large intestine without perforation or abscess with bleeding: Secondary | ICD-10-CM | POA: Diagnosis present

## 2023-01-14 DIAGNOSIS — Z743 Need for continuous supervision: Secondary | ICD-10-CM | POA: Diagnosis not present

## 2023-01-14 DIAGNOSIS — A419 Sepsis, unspecified organism: Secondary | ICD-10-CM | POA: Diagnosis not present

## 2023-01-14 DIAGNOSIS — G20A1 Parkinson's disease without dyskinesia, without mention of fluctuations: Secondary | ICD-10-CM | POA: Diagnosis present

## 2023-01-14 DIAGNOSIS — F329 Major depressive disorder, single episode, unspecified: Secondary | ICD-10-CM | POA: Diagnosis present

## 2023-01-14 DIAGNOSIS — F32A Depression, unspecified: Secondary | ICD-10-CM | POA: Diagnosis not present

## 2023-01-14 DIAGNOSIS — I723 Aneurysm of iliac artery: Secondary | ICD-10-CM | POA: Diagnosis present

## 2023-01-14 DIAGNOSIS — R4182 Altered mental status, unspecified: Secondary | ICD-10-CM | POA: Diagnosis not present

## 2023-01-14 DIAGNOSIS — J9601 Acute respiratory failure with hypoxia: Secondary | ICD-10-CM | POA: Diagnosis not present

## 2023-01-14 DIAGNOSIS — L89611 Pressure ulcer of right heel, stage 1: Secondary | ICD-10-CM | POA: Diagnosis present

## 2023-01-14 DIAGNOSIS — D62 Acute posthemorrhagic anemia: Secondary | ICD-10-CM | POA: Diagnosis present

## 2023-01-14 DIAGNOSIS — R58 Hemorrhage, not elsewhere classified: Secondary | ICD-10-CM | POA: Diagnosis not present

## 2023-01-14 DIAGNOSIS — Z7982 Long term (current) use of aspirin: Secondary | ICD-10-CM | POA: Diagnosis not present

## 2023-01-14 DIAGNOSIS — I6782 Cerebral ischemia: Secondary | ICD-10-CM | POA: Diagnosis not present

## 2023-01-14 DIAGNOSIS — K219 Gastro-esophageal reflux disease without esophagitis: Secondary | ICD-10-CM | POA: Diagnosis present

## 2023-01-14 DIAGNOSIS — I447 Left bundle-branch block, unspecified: Secondary | ICD-10-CM | POA: Diagnosis not present

## 2023-01-14 DIAGNOSIS — I499 Cardiac arrhythmia, unspecified: Secondary | ICD-10-CM | POA: Diagnosis not present

## 2023-01-14 DIAGNOSIS — F419 Anxiety disorder, unspecified: Secondary | ICD-10-CM | POA: Diagnosis present

## 2023-01-14 DIAGNOSIS — J189 Pneumonia, unspecified organism: Secondary | ICD-10-CM | POA: Diagnosis not present

## 2023-01-14 DIAGNOSIS — R29898 Other symptoms and signs involving the musculoskeletal system: Secondary | ICD-10-CM | POA: Diagnosis not present

## 2023-01-14 DIAGNOSIS — I482 Chronic atrial fibrillation, unspecified: Secondary | ICD-10-CM | POA: Diagnosis not present

## 2023-01-14 DIAGNOSIS — I609 Nontraumatic subarachnoid hemorrhage, unspecified: Secondary | ICD-10-CM | POA: Diagnosis not present

## 2023-01-14 DIAGNOSIS — I480 Paroxysmal atrial fibrillation: Secondary | ICD-10-CM | POA: Diagnosis present

## 2023-01-14 DIAGNOSIS — I6200 Nontraumatic subdural hemorrhage, unspecified: Secondary | ICD-10-CM | POA: Diagnosis not present

## 2023-01-14 DIAGNOSIS — K828 Other specified diseases of gallbladder: Secondary | ICD-10-CM | POA: Diagnosis not present

## 2023-01-14 DIAGNOSIS — N281 Cyst of kidney, acquired: Secondary | ICD-10-CM | POA: Diagnosis not present

## 2023-01-14 DIAGNOSIS — K5730 Diverticulosis of large intestine without perforation or abscess without bleeding: Secondary | ICD-10-CM | POA: Diagnosis not present

## 2023-01-14 DIAGNOSIS — R918 Other nonspecific abnormal finding of lung field: Secondary | ICD-10-CM | POA: Diagnosis not present

## 2023-01-14 DIAGNOSIS — Z79899 Other long term (current) drug therapy: Secondary | ICD-10-CM | POA: Diagnosis not present

## 2023-01-14 DIAGNOSIS — K625 Hemorrhage of anus and rectum: Secondary | ICD-10-CM | POA: Diagnosis not present

## 2023-01-14 DIAGNOSIS — I6201 Nontraumatic acute subdural hemorrhage: Secondary | ICD-10-CM | POA: Diagnosis present

## 2023-01-14 DIAGNOSIS — I700 Atherosclerosis of aorta: Secondary | ICD-10-CM | POA: Diagnosis present

## 2023-01-14 DIAGNOSIS — R935 Abnormal findings on diagnostic imaging of other abdominal regions, including retroperitoneum: Secondary | ICD-10-CM | POA: Diagnosis not present

## 2023-01-14 DIAGNOSIS — E039 Hypothyroidism, unspecified: Secondary | ICD-10-CM | POA: Diagnosis present

## 2023-01-14 DIAGNOSIS — R6520 Severe sepsis without septic shock: Secondary | ICD-10-CM | POA: Diagnosis not present

## 2023-01-15 DIAGNOSIS — K625 Hemorrhage of anus and rectum: Secondary | ICD-10-CM | POA: Diagnosis not present

## 2023-01-16 DIAGNOSIS — K625 Hemorrhage of anus and rectum: Secondary | ICD-10-CM | POA: Diagnosis not present

## 2023-01-22 DIAGNOSIS — R918 Other nonspecific abnormal finding of lung field: Secondary | ICD-10-CM | POA: Diagnosis not present

## 2023-01-22 DIAGNOSIS — I4891 Unspecified atrial fibrillation: Secondary | ICD-10-CM | POA: Diagnosis not present

## 2023-01-22 DIAGNOSIS — I959 Hypotension, unspecified: Secondary | ICD-10-CM | POA: Diagnosis not present

## 2023-01-22 DIAGNOSIS — Z8701 Personal history of pneumonia (recurrent): Secondary | ICD-10-CM | POA: Diagnosis not present

## 2023-01-22 DIAGNOSIS — Z79890 Hormone replacement therapy (postmenopausal): Secondary | ICD-10-CM | POA: Diagnosis not present

## 2023-01-22 DIAGNOSIS — R0689 Other abnormalities of breathing: Secondary | ICD-10-CM | POA: Diagnosis not present

## 2023-01-22 DIAGNOSIS — E7800 Pure hypercholesterolemia, unspecified: Secondary | ICD-10-CM | POA: Diagnosis not present

## 2023-01-22 DIAGNOSIS — L89152 Pressure ulcer of sacral region, stage 2: Secondary | ICD-10-CM | POA: Diagnosis not present

## 2023-01-22 DIAGNOSIS — Z7401 Bed confinement status: Secondary | ICD-10-CM | POA: Diagnosis not present

## 2023-01-22 DIAGNOSIS — R1310 Dysphagia, unspecified: Secondary | ICD-10-CM | POA: Diagnosis not present

## 2023-01-22 DIAGNOSIS — K625 Hemorrhage of anus and rectum: Secondary | ICD-10-CM | POA: Diagnosis not present

## 2023-01-22 DIAGNOSIS — J690 Pneumonitis due to inhalation of food and vomit: Secondary | ICD-10-CM | POA: Diagnosis not present

## 2023-01-22 DIAGNOSIS — J188 Other pneumonia, unspecified organism: Secondary | ICD-10-CM | POA: Diagnosis not present

## 2023-01-22 DIAGNOSIS — M1990 Unspecified osteoarthritis, unspecified site: Secondary | ICD-10-CM | POA: Diagnosis present

## 2023-01-22 DIAGNOSIS — Z515 Encounter for palliative care: Secondary | ICD-10-CM | POA: Diagnosis not present

## 2023-01-22 DIAGNOSIS — L89151 Pressure ulcer of sacral region, stage 1: Secondary | ICD-10-CM | POA: Diagnosis not present

## 2023-01-22 DIAGNOSIS — D509 Iron deficiency anemia, unspecified: Secondary | ICD-10-CM | POA: Diagnosis not present

## 2023-01-22 DIAGNOSIS — J189 Pneumonia, unspecified organism: Secondary | ICD-10-CM | POA: Diagnosis not present

## 2023-01-22 DIAGNOSIS — Z8546 Personal history of malignant neoplasm of prostate: Secondary | ICD-10-CM | POA: Diagnosis not present

## 2023-01-22 DIAGNOSIS — R531 Weakness: Secondary | ICD-10-CM | POA: Diagnosis not present

## 2023-01-22 DIAGNOSIS — E870 Hyperosmolality and hypernatremia: Secondary | ICD-10-CM | POA: Diagnosis not present

## 2023-01-22 DIAGNOSIS — G20A1 Parkinson's disease without dyskinesia, without mention of fluctuations: Secondary | ICD-10-CM | POA: Diagnosis present

## 2023-01-22 DIAGNOSIS — M810 Age-related osteoporosis without current pathological fracture: Secondary | ICD-10-CM | POA: Diagnosis not present

## 2023-01-22 DIAGNOSIS — R4182 Altered mental status, unspecified: Secondary | ICD-10-CM | POA: Diagnosis not present

## 2023-01-22 DIAGNOSIS — E878 Other disorders of electrolyte and fluid balance, not elsewhere classified: Secondary | ICD-10-CM | POA: Diagnosis not present

## 2023-01-22 DIAGNOSIS — H9193 Unspecified hearing loss, bilateral: Secondary | ICD-10-CM | POA: Diagnosis not present

## 2023-01-22 DIAGNOSIS — L89896 Pressure-induced deep tissue damage of other site: Secondary | ICD-10-CM | POA: Diagnosis not present

## 2023-01-22 DIAGNOSIS — R0602 Shortness of breath: Secondary | ICD-10-CM | POA: Diagnosis not present

## 2023-01-22 DIAGNOSIS — L89622 Pressure ulcer of left heel, stage 2: Secondary | ICD-10-CM | POA: Diagnosis not present

## 2023-01-22 DIAGNOSIS — E039 Hypothyroidism, unspecified: Secondary | ICD-10-CM | POA: Diagnosis not present

## 2023-01-22 DIAGNOSIS — Z7982 Long term (current) use of aspirin: Secondary | ICD-10-CM | POA: Diagnosis not present

## 2023-01-22 DIAGNOSIS — Z9181 History of falling: Secondary | ICD-10-CM | POA: Diagnosis not present

## 2023-01-22 DIAGNOSIS — R251 Tremor, unspecified: Secondary | ICD-10-CM | POA: Diagnosis not present

## 2023-01-22 DIAGNOSIS — I480 Paroxysmal atrial fibrillation: Secondary | ICD-10-CM | POA: Diagnosis not present

## 2023-01-22 DIAGNOSIS — A419 Sepsis, unspecified organism: Secondary | ICD-10-CM | POA: Diagnosis not present

## 2023-01-22 DIAGNOSIS — I447 Left bundle-branch block, unspecified: Secondary | ICD-10-CM | POA: Diagnosis not present

## 2023-01-22 DIAGNOSIS — R1312 Dysphagia, oropharyngeal phase: Secondary | ICD-10-CM | POA: Diagnosis not present

## 2023-01-22 DIAGNOSIS — R001 Bradycardia, unspecified: Secondary | ICD-10-CM | POA: Diagnosis not present

## 2023-01-22 DIAGNOSIS — R0902 Hypoxemia: Secondary | ICD-10-CM | POA: Diagnosis not present

## 2023-01-28 DIAGNOSIS — Z515 Encounter for palliative care: Secondary | ICD-10-CM | POA: Diagnosis not present

## 2023-01-28 DIAGNOSIS — G20B1 Parkinson's disease with dyskinesia, without mention of fluctuations: Secondary | ICD-10-CM | POA: Diagnosis not present

## 2023-02-02 DIAGNOSIS — Z993 Dependence on wheelchair: Secondary | ICD-10-CM | POA: Diagnosis not present

## 2023-02-02 DIAGNOSIS — M1990 Unspecified osteoarthritis, unspecified site: Secondary | ICD-10-CM | POA: Diagnosis not present

## 2023-02-02 DIAGNOSIS — Z792 Long term (current) use of antibiotics: Secondary | ICD-10-CM | POA: Diagnosis not present

## 2023-02-02 DIAGNOSIS — K625 Hemorrhage of anus and rectum: Secondary | ICD-10-CM | POA: Diagnosis not present

## 2023-02-02 DIAGNOSIS — I480 Paroxysmal atrial fibrillation: Secondary | ICD-10-CM | POA: Diagnosis not present

## 2023-02-02 DIAGNOSIS — H9193 Unspecified hearing loss, bilateral: Secondary | ICD-10-CM | POA: Diagnosis not present

## 2023-02-02 DIAGNOSIS — Z9181 History of falling: Secondary | ICD-10-CM | POA: Diagnosis not present

## 2023-02-02 DIAGNOSIS — G20A1 Parkinson's disease without dyskinesia, without mention of fluctuations: Secondary | ICD-10-CM | POA: Diagnosis not present

## 2023-02-02 DIAGNOSIS — L89151 Pressure ulcer of sacral region, stage 1: Secondary | ICD-10-CM | POA: Diagnosis not present

## 2023-02-02 DIAGNOSIS — R1310 Dysphagia, unspecified: Secondary | ICD-10-CM | POA: Diagnosis not present

## 2023-02-02 DIAGNOSIS — R7301 Impaired fasting glucose: Secondary | ICD-10-CM | POA: Diagnosis not present

## 2023-02-02 DIAGNOSIS — N401 Enlarged prostate with lower urinary tract symptoms: Secondary | ICD-10-CM | POA: Diagnosis not present

## 2023-02-02 DIAGNOSIS — E039 Hypothyroidism, unspecified: Secondary | ICD-10-CM | POA: Diagnosis not present

## 2023-02-02 DIAGNOSIS — M810 Age-related osteoporosis without current pathological fracture: Secondary | ICD-10-CM | POA: Diagnosis not present

## 2023-02-02 DIAGNOSIS — L89210 Pressure ulcer of right hip, unstageable: Secondary | ICD-10-CM | POA: Diagnosis not present

## 2023-02-02 DIAGNOSIS — A419 Sepsis, unspecified organism: Secondary | ICD-10-CM | POA: Diagnosis not present

## 2023-02-02 DIAGNOSIS — Z8546 Personal history of malignant neoplasm of prostate: Secondary | ICD-10-CM | POA: Diagnosis not present

## 2023-02-02 DIAGNOSIS — E7800 Pure hypercholesterolemia, unspecified: Secondary | ICD-10-CM | POA: Diagnosis not present

## 2023-02-02 DIAGNOSIS — J189 Pneumonia, unspecified organism: Secondary | ICD-10-CM | POA: Diagnosis not present

## 2023-02-02 DIAGNOSIS — N39498 Other specified urinary incontinence: Secondary | ICD-10-CM | POA: Diagnosis not present

## 2023-02-02 DIAGNOSIS — Z7982 Long term (current) use of aspirin: Secondary | ICD-10-CM | POA: Diagnosis not present

## 2023-02-02 DIAGNOSIS — K5900 Constipation, unspecified: Secondary | ICD-10-CM | POA: Diagnosis not present

## 2023-02-03 DIAGNOSIS — K625 Hemorrhage of anus and rectum: Secondary | ICD-10-CM | POA: Diagnosis not present

## 2023-02-03 DIAGNOSIS — G20A1 Parkinson's disease without dyskinesia, without mention of fluctuations: Secondary | ICD-10-CM | POA: Diagnosis not present

## 2023-02-03 DIAGNOSIS — L89210 Pressure ulcer of right hip, unstageable: Secondary | ICD-10-CM | POA: Diagnosis not present

## 2023-02-03 DIAGNOSIS — L89151 Pressure ulcer of sacral region, stage 1: Secondary | ICD-10-CM | POA: Diagnosis not present

## 2023-02-03 DIAGNOSIS — E7800 Pure hypercholesterolemia, unspecified: Secondary | ICD-10-CM | POA: Diagnosis not present

## 2023-02-03 DIAGNOSIS — I480 Paroxysmal atrial fibrillation: Secondary | ICD-10-CM | POA: Diagnosis not present

## 2023-02-05 ENCOUNTER — Other Ambulatory Visit: Payer: Self-pay

## 2023-02-10 DIAGNOSIS — J449 Chronic obstructive pulmonary disease, unspecified: Secondary | ICD-10-CM | POA: Diagnosis not present

## 2023-02-10 DIAGNOSIS — I480 Paroxysmal atrial fibrillation: Secondary | ICD-10-CM | POA: Diagnosis not present

## 2023-02-10 DIAGNOSIS — K922 Gastrointestinal hemorrhage, unspecified: Secondary | ICD-10-CM | POA: Diagnosis not present

## 2023-02-10 DIAGNOSIS — J189 Pneumonia, unspecified organism: Secondary | ICD-10-CM | POA: Diagnosis not present

## 2023-02-10 DIAGNOSIS — J9691 Respiratory failure, unspecified with hypoxia: Secondary | ICD-10-CM | POA: Diagnosis not present

## 2023-02-10 DIAGNOSIS — G20A1 Parkinson's disease without dyskinesia, without mention of fluctuations: Secondary | ICD-10-CM | POA: Diagnosis not present

## 2023-02-10 DIAGNOSIS — R4589 Other symptoms and signs involving emotional state: Secondary | ICD-10-CM | POA: Diagnosis not present

## 2023-02-10 DIAGNOSIS — C61 Malignant neoplasm of prostate: Secondary | ICD-10-CM | POA: Diagnosis not present

## 2023-02-10 DIAGNOSIS — E039 Hypothyroidism, unspecified: Secondary | ICD-10-CM | POA: Diagnosis not present

## 2023-02-10 DIAGNOSIS — F02A0 Dementia in other diseases classified elsewhere, mild, without behavioral disturbance, psychotic disturbance, mood disturbance, and anxiety: Secondary | ICD-10-CM | POA: Diagnosis not present

## 2023-02-13 DIAGNOSIS — E7800 Pure hypercholesterolemia, unspecified: Secondary | ICD-10-CM | POA: Diagnosis not present

## 2023-02-13 DIAGNOSIS — K625 Hemorrhage of anus and rectum: Secondary | ICD-10-CM | POA: Diagnosis not present

## 2023-02-13 DIAGNOSIS — L89151 Pressure ulcer of sacral region, stage 1: Secondary | ICD-10-CM | POA: Diagnosis not present

## 2023-02-13 DIAGNOSIS — I480 Paroxysmal atrial fibrillation: Secondary | ICD-10-CM | POA: Diagnosis not present

## 2023-02-13 DIAGNOSIS — G20A1 Parkinson's disease without dyskinesia, without mention of fluctuations: Secondary | ICD-10-CM | POA: Diagnosis not present

## 2023-02-13 DIAGNOSIS — L89210 Pressure ulcer of right hip, unstageable: Secondary | ICD-10-CM | POA: Diagnosis not present

## 2023-02-19 DIAGNOSIS — L89210 Pressure ulcer of right hip, unstageable: Secondary | ICD-10-CM | POA: Diagnosis not present

## 2023-02-19 DIAGNOSIS — G20A1 Parkinson's disease without dyskinesia, without mention of fluctuations: Secondary | ICD-10-CM | POA: Diagnosis not present

## 2023-02-19 DIAGNOSIS — E7800 Pure hypercholesterolemia, unspecified: Secondary | ICD-10-CM | POA: Diagnosis not present

## 2023-02-19 DIAGNOSIS — L89151 Pressure ulcer of sacral region, stage 1: Secondary | ICD-10-CM | POA: Diagnosis not present

## 2023-02-19 DIAGNOSIS — K625 Hemorrhage of anus and rectum: Secondary | ICD-10-CM | POA: Diagnosis not present

## 2023-02-19 DIAGNOSIS — I480 Paroxysmal atrial fibrillation: Secondary | ICD-10-CM | POA: Diagnosis not present

## 2023-02-22 DIAGNOSIS — I480 Paroxysmal atrial fibrillation: Secondary | ICD-10-CM | POA: Diagnosis not present

## 2023-02-22 DIAGNOSIS — L89210 Pressure ulcer of right hip, unstageable: Secondary | ICD-10-CM | POA: Diagnosis not present

## 2023-02-22 DIAGNOSIS — Z515 Encounter for palliative care: Secondary | ICD-10-CM | POA: Diagnosis not present

## 2023-02-22 DIAGNOSIS — R531 Weakness: Secondary | ICD-10-CM | POA: Diagnosis not present

## 2023-02-22 DIAGNOSIS — G20B1 Parkinson's disease with dyskinesia, without mention of fluctuations: Secondary | ICD-10-CM | POA: Diagnosis not present

## 2023-02-22 DIAGNOSIS — K625 Hemorrhage of anus and rectum: Secondary | ICD-10-CM | POA: Diagnosis not present

## 2023-02-22 DIAGNOSIS — Z7409 Other reduced mobility: Secondary | ICD-10-CM | POA: Diagnosis not present

## 2023-02-22 DIAGNOSIS — E7800 Pure hypercholesterolemia, unspecified: Secondary | ICD-10-CM | POA: Diagnosis not present

## 2023-02-22 DIAGNOSIS — G20A1 Parkinson's disease without dyskinesia, without mention of fluctuations: Secondary | ICD-10-CM | POA: Diagnosis not present

## 2023-02-22 DIAGNOSIS — L89151 Pressure ulcer of sacral region, stage 1: Secondary | ICD-10-CM | POA: Diagnosis not present

## 2023-03-02 DIAGNOSIS — L89210 Pressure ulcer of right hip, unstageable: Secondary | ICD-10-CM | POA: Diagnosis not present

## 2023-03-02 DIAGNOSIS — G20A1 Parkinson's disease without dyskinesia, without mention of fluctuations: Secondary | ICD-10-CM | POA: Diagnosis not present

## 2023-03-02 DIAGNOSIS — I480 Paroxysmal atrial fibrillation: Secondary | ICD-10-CM | POA: Diagnosis not present

## 2023-03-02 DIAGNOSIS — E7800 Pure hypercholesterolemia, unspecified: Secondary | ICD-10-CM | POA: Diagnosis not present

## 2023-03-02 DIAGNOSIS — L89151 Pressure ulcer of sacral region, stage 1: Secondary | ICD-10-CM | POA: Diagnosis not present

## 2023-03-02 DIAGNOSIS — K625 Hemorrhage of anus and rectum: Secondary | ICD-10-CM | POA: Diagnosis not present

## 2023-03-04 DIAGNOSIS — Z9181 History of falling: Secondary | ICD-10-CM | POA: Diagnosis not present

## 2023-03-04 DIAGNOSIS — N39498 Other specified urinary incontinence: Secondary | ICD-10-CM | POA: Diagnosis not present

## 2023-03-04 DIAGNOSIS — A419 Sepsis, unspecified organism: Secondary | ICD-10-CM | POA: Diagnosis not present

## 2023-03-04 DIAGNOSIS — Z7982 Long term (current) use of aspirin: Secondary | ICD-10-CM | POA: Diagnosis not present

## 2023-03-04 DIAGNOSIS — K625 Hemorrhage of anus and rectum: Secondary | ICD-10-CM | POA: Diagnosis not present

## 2023-03-04 DIAGNOSIS — M810 Age-related osteoporosis without current pathological fracture: Secondary | ICD-10-CM | POA: Diagnosis not present

## 2023-03-04 DIAGNOSIS — M1990 Unspecified osteoarthritis, unspecified site: Secondary | ICD-10-CM | POA: Diagnosis not present

## 2023-03-04 DIAGNOSIS — I480 Paroxysmal atrial fibrillation: Secondary | ICD-10-CM | POA: Diagnosis not present

## 2023-03-04 DIAGNOSIS — G20A1 Parkinson's disease without dyskinesia, without mention of fluctuations: Secondary | ICD-10-CM | POA: Diagnosis not present

## 2023-03-04 DIAGNOSIS — L89151 Pressure ulcer of sacral region, stage 1: Secondary | ICD-10-CM | POA: Diagnosis not present

## 2023-03-04 DIAGNOSIS — R7301 Impaired fasting glucose: Secondary | ICD-10-CM | POA: Diagnosis not present

## 2023-03-04 DIAGNOSIS — Z792 Long term (current) use of antibiotics: Secondary | ICD-10-CM | POA: Diagnosis not present

## 2023-03-04 DIAGNOSIS — E7800 Pure hypercholesterolemia, unspecified: Secondary | ICD-10-CM | POA: Diagnosis not present

## 2023-03-04 DIAGNOSIS — Z8546 Personal history of malignant neoplasm of prostate: Secondary | ICD-10-CM | POA: Diagnosis not present

## 2023-03-04 DIAGNOSIS — L89210 Pressure ulcer of right hip, unstageable: Secondary | ICD-10-CM | POA: Diagnosis not present

## 2023-03-04 DIAGNOSIS — N401 Enlarged prostate with lower urinary tract symptoms: Secondary | ICD-10-CM | POA: Diagnosis not present

## 2023-03-04 DIAGNOSIS — R1310 Dysphagia, unspecified: Secondary | ICD-10-CM | POA: Diagnosis not present

## 2023-03-04 DIAGNOSIS — E039 Hypothyroidism, unspecified: Secondary | ICD-10-CM | POA: Diagnosis not present

## 2023-03-04 DIAGNOSIS — J189 Pneumonia, unspecified organism: Secondary | ICD-10-CM | POA: Diagnosis not present

## 2023-03-04 DIAGNOSIS — H9193 Unspecified hearing loss, bilateral: Secondary | ICD-10-CM | POA: Diagnosis not present

## 2023-03-04 DIAGNOSIS — K5900 Constipation, unspecified: Secondary | ICD-10-CM | POA: Diagnosis not present

## 2023-03-04 DIAGNOSIS — Z993 Dependence on wheelchair: Secondary | ICD-10-CM | POA: Diagnosis not present

## 2023-03-08 DIAGNOSIS — K625 Hemorrhage of anus and rectum: Secondary | ICD-10-CM | POA: Diagnosis not present

## 2023-03-08 DIAGNOSIS — L89151 Pressure ulcer of sacral region, stage 1: Secondary | ICD-10-CM | POA: Diagnosis not present

## 2023-03-08 DIAGNOSIS — G20A1 Parkinson's disease without dyskinesia, without mention of fluctuations: Secondary | ICD-10-CM | POA: Diagnosis not present

## 2023-03-08 DIAGNOSIS — I480 Paroxysmal atrial fibrillation: Secondary | ICD-10-CM | POA: Diagnosis not present

## 2023-03-08 DIAGNOSIS — L89210 Pressure ulcer of right hip, unstageable: Secondary | ICD-10-CM | POA: Diagnosis not present

## 2023-03-08 DIAGNOSIS — E7800 Pure hypercholesterolemia, unspecified: Secondary | ICD-10-CM | POA: Diagnosis not present

## 2023-03-10 DIAGNOSIS — E039 Hypothyroidism, unspecified: Secondary | ICD-10-CM | POA: Diagnosis not present

## 2023-03-10 DIAGNOSIS — N401 Enlarged prostate with lower urinary tract symptoms: Secondary | ICD-10-CM | POA: Diagnosis not present

## 2023-03-10 DIAGNOSIS — M810 Age-related osteoporosis without current pathological fracture: Secondary | ICD-10-CM | POA: Diagnosis not present

## 2023-03-10 DIAGNOSIS — G20A1 Parkinson's disease without dyskinesia, without mention of fluctuations: Secondary | ICD-10-CM | POA: Diagnosis not present

## 2023-03-10 DIAGNOSIS — K625 Hemorrhage of anus and rectum: Secondary | ICD-10-CM | POA: Diagnosis not present

## 2023-03-10 DIAGNOSIS — J189 Pneumonia, unspecified organism: Secondary | ICD-10-CM | POA: Diagnosis not present

## 2023-03-10 DIAGNOSIS — E7800 Pure hypercholesterolemia, unspecified: Secondary | ICD-10-CM | POA: Diagnosis not present

## 2023-03-10 DIAGNOSIS — L89151 Pressure ulcer of sacral region, stage 1: Secondary | ICD-10-CM | POA: Diagnosis not present

## 2023-03-10 DIAGNOSIS — A419 Sepsis, unspecified organism: Secondary | ICD-10-CM | POA: Diagnosis not present

## 2023-03-10 DIAGNOSIS — N39498 Other specified urinary incontinence: Secondary | ICD-10-CM | POA: Diagnosis not present

## 2023-03-10 DIAGNOSIS — I480 Paroxysmal atrial fibrillation: Secondary | ICD-10-CM | POA: Diagnosis not present

## 2023-03-10 DIAGNOSIS — L89210 Pressure ulcer of right hip, unstageable: Secondary | ICD-10-CM | POA: Diagnosis not present

## 2023-03-12 DIAGNOSIS — K625 Hemorrhage of anus and rectum: Secondary | ICD-10-CM | POA: Diagnosis not present

## 2023-03-12 DIAGNOSIS — L89210 Pressure ulcer of right hip, unstageable: Secondary | ICD-10-CM | POA: Diagnosis not present

## 2023-03-12 DIAGNOSIS — I480 Paroxysmal atrial fibrillation: Secondary | ICD-10-CM | POA: Diagnosis not present

## 2023-03-12 DIAGNOSIS — G20A1 Parkinson's disease without dyskinesia, without mention of fluctuations: Secondary | ICD-10-CM | POA: Diagnosis not present

## 2023-03-12 DIAGNOSIS — E7800 Pure hypercholesterolemia, unspecified: Secondary | ICD-10-CM | POA: Diagnosis not present

## 2023-03-12 DIAGNOSIS — L89151 Pressure ulcer of sacral region, stage 1: Secondary | ICD-10-CM | POA: Diagnosis not present

## 2023-03-16 DIAGNOSIS — L89210 Pressure ulcer of right hip, unstageable: Secondary | ICD-10-CM | POA: Diagnosis not present

## 2023-03-16 DIAGNOSIS — E7800 Pure hypercholesterolemia, unspecified: Secondary | ICD-10-CM | POA: Diagnosis not present

## 2023-03-16 DIAGNOSIS — G20A1 Parkinson's disease without dyskinesia, without mention of fluctuations: Secondary | ICD-10-CM | POA: Diagnosis not present

## 2023-03-16 DIAGNOSIS — L89151 Pressure ulcer of sacral region, stage 1: Secondary | ICD-10-CM | POA: Diagnosis not present

## 2023-03-16 DIAGNOSIS — I480 Paroxysmal atrial fibrillation: Secondary | ICD-10-CM | POA: Diagnosis not present

## 2023-03-16 DIAGNOSIS — K625 Hemorrhage of anus and rectum: Secondary | ICD-10-CM | POA: Diagnosis not present

## 2023-03-19 DIAGNOSIS — L89151 Pressure ulcer of sacral region, stage 1: Secondary | ICD-10-CM | POA: Diagnosis not present

## 2023-03-19 DIAGNOSIS — I959 Hypotension, unspecified: Secondary | ICD-10-CM | POA: Diagnosis not present

## 2023-03-19 DIAGNOSIS — J42 Unspecified chronic bronchitis: Secondary | ICD-10-CM | POA: Diagnosis not present

## 2023-03-19 DIAGNOSIS — M898X4 Other specified disorders of bone, hand: Secondary | ICD-10-CM | POA: Diagnosis not present

## 2023-03-19 DIAGNOSIS — E7800 Pure hypercholesterolemia, unspecified: Secondary | ICD-10-CM | POA: Diagnosis not present

## 2023-03-19 DIAGNOSIS — G20A1 Parkinson's disease without dyskinesia, without mention of fluctuations: Secondary | ICD-10-CM | POA: Diagnosis not present

## 2023-03-19 DIAGNOSIS — I480 Paroxysmal atrial fibrillation: Secondary | ICD-10-CM | POA: Diagnosis not present

## 2023-03-19 DIAGNOSIS — Z23 Encounter for immunization: Secondary | ICD-10-CM | POA: Diagnosis not present

## 2023-03-19 DIAGNOSIS — Z043 Encounter for examination and observation following other accident: Secondary | ICD-10-CM | POA: Diagnosis not present

## 2023-03-19 DIAGNOSIS — G20C Parkinsonism, unspecified: Secondary | ICD-10-CM | POA: Diagnosis not present

## 2023-03-19 DIAGNOSIS — S52612A Displaced fracture of left ulna styloid process, initial encounter for closed fracture: Secondary | ICD-10-CM | POA: Diagnosis not present

## 2023-03-19 DIAGNOSIS — S61412A Laceration without foreign body of left hand, initial encounter: Secondary | ICD-10-CM | POA: Diagnosis not present

## 2023-03-19 DIAGNOSIS — R918 Other nonspecific abnormal finding of lung field: Secondary | ICD-10-CM | POA: Diagnosis not present

## 2023-03-19 DIAGNOSIS — S299XXA Unspecified injury of thorax, initial encounter: Secondary | ICD-10-CM | POA: Diagnosis not present

## 2023-03-19 DIAGNOSIS — W19XXXA Unspecified fall, initial encounter: Secondary | ICD-10-CM | POA: Diagnosis not present

## 2023-03-19 DIAGNOSIS — M79632 Pain in left forearm: Secondary | ICD-10-CM | POA: Diagnosis not present

## 2023-03-19 DIAGNOSIS — K625 Hemorrhage of anus and rectum: Secondary | ICD-10-CM | POA: Diagnosis not present

## 2023-03-19 DIAGNOSIS — L89210 Pressure ulcer of right hip, unstageable: Secondary | ICD-10-CM | POA: Diagnosis not present

## 2023-03-19 DIAGNOSIS — M1812 Unilateral primary osteoarthritis of first carpometacarpal joint, left hand: Secondary | ICD-10-CM | POA: Diagnosis not present

## 2023-03-19 DIAGNOSIS — Z96643 Presence of artificial hip joint, bilateral: Secondary | ICD-10-CM | POA: Diagnosis not present

## 2023-03-19 DIAGNOSIS — I700 Atherosclerosis of aorta: Secondary | ICD-10-CM | POA: Diagnosis not present

## 2023-03-19 DIAGNOSIS — M85822 Other specified disorders of bone density and structure, left upper arm: Secondary | ICD-10-CM | POA: Diagnosis not present

## 2023-03-20 DIAGNOSIS — R404 Transient alteration of awareness: Secondary | ICD-10-CM | POA: Diagnosis not present

## 2023-03-20 DIAGNOSIS — Z743 Need for continuous supervision: Secondary | ICD-10-CM | POA: Diagnosis not present

## 2023-03-22 DIAGNOSIS — L89151 Pressure ulcer of sacral region, stage 1: Secondary | ICD-10-CM | POA: Diagnosis not present

## 2023-03-22 DIAGNOSIS — E7800 Pure hypercholesterolemia, unspecified: Secondary | ICD-10-CM | POA: Diagnosis not present

## 2023-03-22 DIAGNOSIS — L89210 Pressure ulcer of right hip, unstageable: Secondary | ICD-10-CM | POA: Diagnosis not present

## 2023-03-22 DIAGNOSIS — I480 Paroxysmal atrial fibrillation: Secondary | ICD-10-CM | POA: Diagnosis not present

## 2023-03-22 DIAGNOSIS — G20A1 Parkinson's disease without dyskinesia, without mention of fluctuations: Secondary | ICD-10-CM | POA: Diagnosis not present

## 2023-03-22 DIAGNOSIS — K625 Hemorrhage of anus and rectum: Secondary | ICD-10-CM | POA: Diagnosis not present

## 2023-03-24 DIAGNOSIS — L89151 Pressure ulcer of sacral region, stage 1: Secondary | ICD-10-CM | POA: Diagnosis not present

## 2023-03-24 DIAGNOSIS — G20A1 Parkinson's disease without dyskinesia, without mention of fluctuations: Secondary | ICD-10-CM | POA: Diagnosis not present

## 2023-03-24 DIAGNOSIS — E7800 Pure hypercholesterolemia, unspecified: Secondary | ICD-10-CM | POA: Diagnosis not present

## 2023-03-24 DIAGNOSIS — K625 Hemorrhage of anus and rectum: Secondary | ICD-10-CM | POA: Diagnosis not present

## 2023-03-24 DIAGNOSIS — L89210 Pressure ulcer of right hip, unstageable: Secondary | ICD-10-CM | POA: Diagnosis not present

## 2023-03-24 DIAGNOSIS — I480 Paroxysmal atrial fibrillation: Secondary | ICD-10-CM | POA: Diagnosis not present

## 2023-03-26 DIAGNOSIS — G20A1 Parkinson's disease without dyskinesia, without mention of fluctuations: Secondary | ICD-10-CM | POA: Diagnosis not present

## 2023-03-26 DIAGNOSIS — I480 Paroxysmal atrial fibrillation: Secondary | ICD-10-CM | POA: Diagnosis not present

## 2023-03-26 DIAGNOSIS — L89151 Pressure ulcer of sacral region, stage 1: Secondary | ICD-10-CM | POA: Diagnosis not present

## 2023-03-26 DIAGNOSIS — K625 Hemorrhage of anus and rectum: Secondary | ICD-10-CM | POA: Diagnosis not present

## 2023-03-26 DIAGNOSIS — E7800 Pure hypercholesterolemia, unspecified: Secondary | ICD-10-CM | POA: Diagnosis not present

## 2023-03-26 DIAGNOSIS — L89210 Pressure ulcer of right hip, unstageable: Secondary | ICD-10-CM | POA: Diagnosis not present

## 2023-03-30 DIAGNOSIS — K625 Hemorrhage of anus and rectum: Secondary | ICD-10-CM | POA: Diagnosis not present

## 2023-03-30 DIAGNOSIS — E7800 Pure hypercholesterolemia, unspecified: Secondary | ICD-10-CM | POA: Diagnosis not present

## 2023-03-30 DIAGNOSIS — L89210 Pressure ulcer of right hip, unstageable: Secondary | ICD-10-CM | POA: Diagnosis not present

## 2023-03-30 DIAGNOSIS — I480 Paroxysmal atrial fibrillation: Secondary | ICD-10-CM | POA: Diagnosis not present

## 2023-03-30 DIAGNOSIS — L89151 Pressure ulcer of sacral region, stage 1: Secondary | ICD-10-CM | POA: Diagnosis not present

## 2023-03-30 DIAGNOSIS — G20A1 Parkinson's disease without dyskinesia, without mention of fluctuations: Secondary | ICD-10-CM | POA: Diagnosis not present

## 2023-04-01 DIAGNOSIS — L89220 Pressure ulcer of left hip, unstageable: Secondary | ICD-10-CM | POA: Diagnosis not present

## 2023-04-01 DIAGNOSIS — L89210 Pressure ulcer of right hip, unstageable: Secondary | ICD-10-CM | POA: Diagnosis not present

## 2023-04-01 DIAGNOSIS — K625 Hemorrhage of anus and rectum: Secondary | ICD-10-CM | POA: Diagnosis not present

## 2023-04-01 DIAGNOSIS — L89214 Pressure ulcer of right hip, stage 4: Secondary | ICD-10-CM | POA: Diagnosis not present

## 2023-04-01 DIAGNOSIS — R54 Age-related physical debility: Secondary | ICD-10-CM | POA: Diagnosis not present

## 2023-04-01 DIAGNOSIS — I4891 Unspecified atrial fibrillation: Secondary | ICD-10-CM | POA: Diagnosis not present

## 2023-04-01 DIAGNOSIS — E7800 Pure hypercholesterolemia, unspecified: Secondary | ICD-10-CM | POA: Diagnosis not present

## 2023-04-01 DIAGNOSIS — L89151 Pressure ulcer of sacral region, stage 1: Secondary | ICD-10-CM | POA: Diagnosis not present

## 2023-04-01 DIAGNOSIS — F0280 Dementia in other diseases classified elsewhere without behavioral disturbance: Secondary | ICD-10-CM | POA: Diagnosis not present

## 2023-04-01 DIAGNOSIS — G20A1 Parkinson's disease without dyskinesia, without mention of fluctuations: Secondary | ICD-10-CM | POA: Diagnosis not present

## 2023-04-01 DIAGNOSIS — I480 Paroxysmal atrial fibrillation: Secondary | ICD-10-CM | POA: Diagnosis not present

## 2023-04-03 DIAGNOSIS — Z792 Long term (current) use of antibiotics: Secondary | ICD-10-CM | POA: Diagnosis not present

## 2023-04-03 DIAGNOSIS — M1990 Unspecified osteoarthritis, unspecified site: Secondary | ICD-10-CM | POA: Diagnosis not present

## 2023-04-03 DIAGNOSIS — E7800 Pure hypercholesterolemia, unspecified: Secondary | ICD-10-CM | POA: Diagnosis not present

## 2023-04-03 DIAGNOSIS — J189 Pneumonia, unspecified organism: Secondary | ICD-10-CM | POA: Diagnosis not present

## 2023-04-03 DIAGNOSIS — N39498 Other specified urinary incontinence: Secondary | ICD-10-CM | POA: Diagnosis not present

## 2023-04-03 DIAGNOSIS — E039 Hypothyroidism, unspecified: Secondary | ICD-10-CM | POA: Diagnosis not present

## 2023-04-03 DIAGNOSIS — L89151 Pressure ulcer of sacral region, stage 1: Secondary | ICD-10-CM | POA: Diagnosis not present

## 2023-04-03 DIAGNOSIS — H9193 Unspecified hearing loss, bilateral: Secondary | ICD-10-CM | POA: Diagnosis not present

## 2023-04-03 DIAGNOSIS — Z8546 Personal history of malignant neoplasm of prostate: Secondary | ICD-10-CM | POA: Diagnosis not present

## 2023-04-03 DIAGNOSIS — M810 Age-related osteoporosis without current pathological fracture: Secondary | ICD-10-CM | POA: Diagnosis not present

## 2023-04-03 DIAGNOSIS — A419 Sepsis, unspecified organism: Secondary | ICD-10-CM | POA: Diagnosis not present

## 2023-04-03 DIAGNOSIS — K5900 Constipation, unspecified: Secondary | ICD-10-CM | POA: Diagnosis not present

## 2023-04-03 DIAGNOSIS — Z9181 History of falling: Secondary | ICD-10-CM | POA: Diagnosis not present

## 2023-04-03 DIAGNOSIS — I480 Paroxysmal atrial fibrillation: Secondary | ICD-10-CM | POA: Diagnosis not present

## 2023-04-03 DIAGNOSIS — R7301 Impaired fasting glucose: Secondary | ICD-10-CM | POA: Diagnosis not present

## 2023-04-03 DIAGNOSIS — R1310 Dysphagia, unspecified: Secondary | ICD-10-CM | POA: Diagnosis not present

## 2023-04-03 DIAGNOSIS — K625 Hemorrhage of anus and rectum: Secondary | ICD-10-CM | POA: Diagnosis not present

## 2023-04-03 DIAGNOSIS — L89210 Pressure ulcer of right hip, unstageable: Secondary | ICD-10-CM | POA: Diagnosis not present

## 2023-04-03 DIAGNOSIS — Z993 Dependence on wheelchair: Secondary | ICD-10-CM | POA: Diagnosis not present

## 2023-04-03 DIAGNOSIS — G20A1 Parkinson's disease without dyskinesia, without mention of fluctuations: Secondary | ICD-10-CM | POA: Diagnosis not present

## 2023-04-03 DIAGNOSIS — Z7982 Long term (current) use of aspirin: Secondary | ICD-10-CM | POA: Diagnosis not present

## 2023-04-03 DIAGNOSIS — N401 Enlarged prostate with lower urinary tract symptoms: Secondary | ICD-10-CM | POA: Diagnosis not present

## 2023-04-05 DIAGNOSIS — L89210 Pressure ulcer of right hip, unstageable: Secondary | ICD-10-CM | POA: Diagnosis not present

## 2023-04-05 DIAGNOSIS — G20A1 Parkinson's disease without dyskinesia, without mention of fluctuations: Secondary | ICD-10-CM | POA: Diagnosis not present

## 2023-04-05 DIAGNOSIS — E7800 Pure hypercholesterolemia, unspecified: Secondary | ICD-10-CM | POA: Diagnosis not present

## 2023-04-05 DIAGNOSIS — K625 Hemorrhage of anus and rectum: Secondary | ICD-10-CM | POA: Diagnosis not present

## 2023-04-05 DIAGNOSIS — L89151 Pressure ulcer of sacral region, stage 1: Secondary | ICD-10-CM | POA: Diagnosis not present

## 2023-04-05 DIAGNOSIS — I480 Paroxysmal atrial fibrillation: Secondary | ICD-10-CM | POA: Diagnosis not present

## 2023-04-09 DIAGNOSIS — L89210 Pressure ulcer of right hip, unstageable: Secondary | ICD-10-CM | POA: Diagnosis not present

## 2023-04-09 DIAGNOSIS — K625 Hemorrhage of anus and rectum: Secondary | ICD-10-CM | POA: Diagnosis not present

## 2023-04-09 DIAGNOSIS — E7800 Pure hypercholesterolemia, unspecified: Secondary | ICD-10-CM | POA: Diagnosis not present

## 2023-04-09 DIAGNOSIS — L89151 Pressure ulcer of sacral region, stage 1: Secondary | ICD-10-CM | POA: Diagnosis not present

## 2023-04-09 DIAGNOSIS — G20A1 Parkinson's disease without dyskinesia, without mention of fluctuations: Secondary | ICD-10-CM | POA: Diagnosis not present

## 2023-04-09 DIAGNOSIS — I480 Paroxysmal atrial fibrillation: Secondary | ICD-10-CM | POA: Diagnosis not present

## 2023-04-12 DIAGNOSIS — M1990 Unspecified osteoarthritis, unspecified site: Secondary | ICD-10-CM | POA: Diagnosis not present

## 2023-04-12 DIAGNOSIS — K625 Hemorrhage of anus and rectum: Secondary | ICD-10-CM | POA: Diagnosis not present

## 2023-04-12 DIAGNOSIS — I480 Paroxysmal atrial fibrillation: Secondary | ICD-10-CM | POA: Diagnosis not present

## 2023-04-12 DIAGNOSIS — L89151 Pressure ulcer of sacral region, stage 1: Secondary | ICD-10-CM | POA: Diagnosis not present

## 2023-04-12 DIAGNOSIS — M810 Age-related osteoporosis without current pathological fracture: Secondary | ICD-10-CM | POA: Diagnosis not present

## 2023-04-12 DIAGNOSIS — N401 Enlarged prostate with lower urinary tract symptoms: Secondary | ICD-10-CM | POA: Diagnosis not present

## 2023-04-12 DIAGNOSIS — N39498 Other specified urinary incontinence: Secondary | ICD-10-CM | POA: Diagnosis not present

## 2023-04-12 DIAGNOSIS — E039 Hypothyroidism, unspecified: Secondary | ICD-10-CM | POA: Diagnosis not present

## 2023-04-12 DIAGNOSIS — E7800 Pure hypercholesterolemia, unspecified: Secondary | ICD-10-CM | POA: Diagnosis not present

## 2023-04-12 DIAGNOSIS — G20A1 Parkinson's disease without dyskinesia, without mention of fluctuations: Secondary | ICD-10-CM | POA: Diagnosis not present

## 2023-04-12 DIAGNOSIS — L89210 Pressure ulcer of right hip, unstageable: Secondary | ICD-10-CM | POA: Diagnosis not present

## 2023-04-12 DIAGNOSIS — K5900 Constipation, unspecified: Secondary | ICD-10-CM | POA: Diagnosis not present

## 2023-04-13 ENCOUNTER — Telehealth: Payer: Self-pay | Admitting: Neurology

## 2023-04-13 NOTE — Telephone Encounter (Signed)
Called patients son Marquee and left a message for a call back.

## 2023-04-13 NOTE — Progress Notes (Deleted)
Assessment/Plan:   1.  Parkinsons Disease  -Take carbidopa/levodopa, 2 at 8am/noon/4pm  -Take carbidopa/levodopa 50/200 cr at bed  -Discussed follow-up with patient and family.  Until today, I had not seen the patient in 2 years.  -would like to see PT more than 1 time per week ([facility])  -daughter asks about inbrija but don't think that will be helpful right now  -daughter asks about Parkinsons Disease dyskinesia.  Not bothersome to patient.  Discussed nature and pathophysiollogy.  I would not change anything in that regard.    -daughter asks about more information and education.  Told her that support groups/caregiver support group would be beneficial.  She isn't interested.  Told her that it would be beneficial for her/him to make sure that he f/u more than q 2 years as well.  2.  Memory change, likely PDD  -Patient with 24 hour/day caregivers in the home.  -Patient's son is healthcare power of attorney   3.  Atrial fibrillation  -Patient is on amiodarone  -Patient not on anticoagulation.    -Patient did have GI bleed and aspirin was stopped.  Subjective:   Nathan Stanley was seen today for Parkinsons disease.  I saw the patient last in April, but had not seen him in about 2 years prior to that.  When we talked about hallucinations last visit, they stated that he rarely had them (had stated that he had them 1 time the prior week).  The week after the appointment, the daughter called me and stated that the patient was having hallucinations "all day."  We called them back and stated that he needed an appointment because of the fact that they stated hallucinations were much more, offered an appointment the next day and the daughter declined at and was frustrated that he needed to come in.  We subsequently talked to the caregiver, who stated that there was apparently a family issue going on and we were not getting the entire story.  Apparently, the caregiver stated that the daughter was  no longer to be involved with the patient's care and the son was the power of attorney (which we knew, but he is unable to come to appointments and he had told us it was okay for the daughter to come).  The caregiver stated that the daughter was not to make appointments and is no longer allowed in the patient's home.  We did tell them we could offer appointments and medication if the patient was having hallucinations, but we would need permission from the POA given the black box warning and they were to call us back, but we never heard back after that.  Current prescribed movement disorder medications: Carbidopa/levodopa 25/100, 1.5 tablets four times per day (8am/11:30/4:30/8:30 - takes this same as the CR) Carbidopa/levodopa 25/100 CR at bedtime    ALLERGIES:  No Known Allergies  CURRENT MEDICATIONS:  Outpatient Encounter Medications as of 04/15/2023  Medication Sig   amiodarone (PACERONE) 200 MG tablet Take 1 tablet by mouth daily.   aspirin EC 81 MG tablet Take 1 tablet (81 mg total) by mouth daily.   calcitonin, salmon, (MIACALCIN/FORTICAL) 200 UNIT/ACT nasal spray Place 1 spray into alternate nostrils daily. (Patient not taking: Reported on 10/06/2022)   calcium citrate (CALCITRATE - DOSED IN MG ELEMENTAL CALCIUM) 950 (200 Ca) MG tablet Take 200 mg of elemental calcium by mouth daily. (Patient not taking: Reported on 10/06/2022)   carbidopa-levodopa (SINEMET CR) 50-200 MG tablet Take 1 tablet by  mouth at bedtime.   carbidopa-levodopa (SINEMET IR) 25-100 MG tablet Take 2 tablets by mouth 3 (three) times daily. 8am/noon/4pm   Cholecalciferol (VITAMIN D-3) 25 MCG (1000 UT) CAPS Take 1 capsule by mouth daily. (Patient not taking: Reported on 10/06/2022)   diltiazem (DILACOR XR) 180 MG 24 hr capsule Take 180 mg by mouth daily.   escitalopram (LEXAPRO) 10 MG tablet Take 10 mg by mouth daily.   famotidine (PEPCID) 20 MG tablet One after supper   levothyroxine (SYNTHROID) 25 MCG tablet Take 25 mcg by  mouth daily.   LORazepam (ATIVAN) 0.5 MG tablet Take 0.5 mg by mouth as needed for anxiety. (Patient not taking: Reported on 10/06/2022)   pantoprazole (PROTONIX) 40 MG tablet Take 1 tablet by mouth daily 30 to 60 minutes before first meal of the day.   No facility-administered encounter medications on file as of 04/15/2023.    Objective:   PHYSICAL EXAMINATION:    VITALS:   There were no vitals filed for this visit.    GEN:  The patient appears stated age and is in NAD. HEENT:  Normocephalic, atraumatic.  The mucous membranes are moist. The superficial temporal arteries are without ropiness or tenderness. CV:  RRR Lungs:  CTAB Neck/HEME:  There are no carotid bruits bilaterally.  Neurological examination:  Orientation: The patient is alert and oriented to person, place and time today.   Cranial nerves: There is good facial symmetry with mild facial hypomimia. The speech is fluent and clear. Soft palate rises symmetrically and there is no tongue deviation. Hearing is decreased to conversational tone. Sensation: Sensation is intact to light touch throughout Motor: Strength is at least antigravity x4.  Movement examination: Tone: There is nl tone in the UE bilaterally Abnormal movements: there is mild leg dyskinesia.   Coordination:  There is no decremation with RAM's, but he has a disfigured hand on the right and cannot do RAMs on the right, although he attempts alternation of supination/pronation Gait and Station: The patient pushes off of the transport chair and requires assist out of chair.  He is given a walker and drags the L leg.    Total time spent on today's visit was *** minutes, including both face-to-face time and nonface-to-face time.  Time included that spent on review of records (prior notes available to me/labs/imaging if pertinent), discussing treatment and goals, answering patient's questions and coordinating care.  Cc:  Geoffry Paradise, MD

## 2023-04-13 NOTE — Telephone Encounter (Signed)
Nathan Stanley, see 4/24 phone call.  The son is the POA.  He doesn't want the daughter involved but told us previously okay if she comes to appts.  However, she called since last visit and gave [place] inaccurate information about the patient and the caregiver called back and stated that the daughter cannot be involved in care.  Please call the son and ask if he is coming tomorrow.  If not, how does he want [place] to handle the daughter because its very difficult to deal with in the office when we get conflicting information.  The caregiver the patient brings is helpful though.  We will need an answer today since the appt is tomorrow

## 2023-04-14 NOTE — Telephone Encounter (Signed)
Called patients son went to voicemail

## 2023-04-15 ENCOUNTER — Ambulatory Visit: Payer: Medicare Other | Admitting: Neurology

## 2023-04-15 DIAGNOSIS — R4182 Altered mental status, unspecified: Secondary | ICD-10-CM | POA: Diagnosis not present

## 2023-04-15 DIAGNOSIS — R918 Other nonspecific abnormal finding of lung field: Secondary | ICD-10-CM | POA: Diagnosis not present

## 2023-04-15 DIAGNOSIS — R0902 Hypoxemia: Secondary | ICD-10-CM | POA: Diagnosis not present

## 2023-04-15 DIAGNOSIS — R7881 Bacteremia: Secondary | ICD-10-CM | POA: Diagnosis not present

## 2023-04-15 DIAGNOSIS — L89150 Pressure ulcer of sacral region, unstageable: Secondary | ICD-10-CM | POA: Diagnosis present

## 2023-04-15 DIAGNOSIS — R1310 Dysphagia, unspecified: Secondary | ICD-10-CM | POA: Diagnosis present

## 2023-04-15 DIAGNOSIS — R0602 Shortness of breath: Secondary | ICD-10-CM | POA: Diagnosis not present

## 2023-04-15 DIAGNOSIS — R6520 Severe sepsis without septic shock: Secondary | ICD-10-CM | POA: Diagnosis present

## 2023-04-15 DIAGNOSIS — H9190 Unspecified hearing loss, unspecified ear: Secondary | ICD-10-CM | POA: Diagnosis present

## 2023-04-15 DIAGNOSIS — K625 Hemorrhage of anus and rectum: Secondary | ICD-10-CM | POA: Diagnosis not present

## 2023-04-15 DIAGNOSIS — I499 Cardiac arrhythmia, unspecified: Secondary | ICD-10-CM | POA: Diagnosis not present

## 2023-04-15 DIAGNOSIS — B9562 Methicillin resistant Staphylococcus aureus infection as the cause of diseases classified elsewhere: Secondary | ICD-10-CM | POA: Diagnosis not present

## 2023-04-15 DIAGNOSIS — E7800 Pure hypercholesterolemia, unspecified: Secondary | ICD-10-CM | POA: Diagnosis not present

## 2023-04-15 DIAGNOSIS — E872 Acidosis, unspecified: Secondary | ICD-10-CM | POA: Diagnosis present

## 2023-04-15 DIAGNOSIS — L89151 Pressure ulcer of sacral region, stage 1: Secondary | ICD-10-CM | POA: Diagnosis not present

## 2023-04-15 DIAGNOSIS — I447 Left bundle-branch block, unspecified: Secondary | ICD-10-CM | POA: Diagnosis not present

## 2023-04-15 DIAGNOSIS — A4102 Sepsis due to Methicillin resistant Staphylococcus aureus: Secondary | ICD-10-CM | POA: Diagnosis present

## 2023-04-15 DIAGNOSIS — L8946 Pressure-induced deep tissue damage of contiguous site of back, buttock and hip: Secondary | ICD-10-CM | POA: Diagnosis not present

## 2023-04-15 DIAGNOSIS — I480 Paroxysmal atrial fibrillation: Secondary | ICD-10-CM | POA: Diagnosis not present

## 2023-04-15 DIAGNOSIS — L89214 Pressure ulcer of right hip, stage 4: Secondary | ICD-10-CM | POA: Diagnosis not present

## 2023-04-15 DIAGNOSIS — R0689 Other abnormalities of breathing: Secondary | ICD-10-CM | POA: Diagnosis not present

## 2023-04-15 DIAGNOSIS — F419 Anxiety disorder, unspecified: Secondary | ICD-10-CM | POA: Diagnosis present

## 2023-04-15 DIAGNOSIS — J189 Pneumonia, unspecified organism: Secondary | ICD-10-CM | POA: Diagnosis present

## 2023-04-15 DIAGNOSIS — Z515 Encounter for palliative care: Secondary | ICD-10-CM | POA: Diagnosis not present

## 2023-04-15 DIAGNOSIS — A419 Sepsis, unspecified organism: Secondary | ICD-10-CM | POA: Diagnosis not present

## 2023-04-15 DIAGNOSIS — N179 Acute kidney failure, unspecified: Secondary | ICD-10-CM | POA: Diagnosis not present

## 2023-04-15 DIAGNOSIS — E039 Hypothyroidism, unspecified: Secondary | ICD-10-CM | POA: Diagnosis present

## 2023-04-15 DIAGNOSIS — J9601 Acute respiratory failure with hypoxia: Secondary | ICD-10-CM | POA: Diagnosis present

## 2023-04-15 DIAGNOSIS — R7989 Other specified abnormal findings of blood chemistry: Secondary | ICD-10-CM | POA: Diagnosis present

## 2023-04-15 DIAGNOSIS — G20A1 Parkinson's disease without dyskinesia, without mention of fluctuations: Secondary | ICD-10-CM | POA: Diagnosis not present

## 2023-04-15 DIAGNOSIS — F32A Depression, unspecified: Secondary | ICD-10-CM | POA: Diagnosis present

## 2023-04-15 DIAGNOSIS — G20B1 Parkinson's disease with dyskinesia, without mention of fluctuations: Secondary | ICD-10-CM | POA: Diagnosis not present

## 2023-04-15 DIAGNOSIS — Z66 Do not resuscitate: Secondary | ICD-10-CM | POA: Diagnosis not present

## 2023-04-15 DIAGNOSIS — Z7982 Long term (current) use of aspirin: Secondary | ICD-10-CM | POA: Diagnosis not present

## 2023-04-15 DIAGNOSIS — Z79899 Other long term (current) drug therapy: Secondary | ICD-10-CM | POA: Diagnosis not present

## 2023-04-15 DIAGNOSIS — L89224 Pressure ulcer of left hip, stage 4: Secondary | ICD-10-CM | POA: Diagnosis present

## 2023-04-15 DIAGNOSIS — L89210 Pressure ulcer of right hip, unstageable: Secondary | ICD-10-CM | POA: Diagnosis not present

## 2023-04-15 DIAGNOSIS — J168 Pneumonia due to other specified infectious organisms: Secondary | ICD-10-CM | POA: Diagnosis not present

## 2023-04-15 DIAGNOSIS — I517 Cardiomegaly: Secondary | ICD-10-CM | POA: Diagnosis not present

## 2023-04-15 DIAGNOSIS — I482 Chronic atrial fibrillation, unspecified: Secondary | ICD-10-CM | POA: Diagnosis present

## 2023-04-15 DIAGNOSIS — R404 Transient alteration of awareness: Secondary | ICD-10-CM | POA: Diagnosis not present

## 2023-04-15 NOTE — Telephone Encounter (Signed)
Pt arrived with daughter/caregiver today 20 min late for appt and was not able to be seen.  Daughter was very upset and stated that she did not wish to continue care here and walked out.  Unfortunately, this was the behavior we were trying to prevent when we called the son 4 times the last few days.  I don't want to punish the patient by d/c him (at daughters request) but we cannot get ahold of son (who is apparently POA but we have no papers, despite asking for them).  [place], please call the son back one last time.  If he doesn't answer, leave a VM and tell him that pt arrived late today and daughter was upset and not coming back.  She has been a bit disruptive in the past with conflicting information.  If they want to continue to come here, the patient and caregiver can come, but the daughter will need to stay home since she isn't POA.  If they don't return call, we will d/c at their request to go elsewhere.
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
## 2023-04-16 DIAGNOSIS — I480 Paroxysmal atrial fibrillation: Secondary | ICD-10-CM | POA: Diagnosis not present

## 2023-04-16 DIAGNOSIS — L89210 Pressure ulcer of right hip, unstageable: Secondary | ICD-10-CM | POA: Diagnosis not present

## 2023-04-16 DIAGNOSIS — K625 Hemorrhage of anus and rectum: Secondary | ICD-10-CM | POA: Diagnosis not present

## 2023-04-16 DIAGNOSIS — G20A1 Parkinson's disease without dyskinesia, without mention of fluctuations: Secondary | ICD-10-CM | POA: Diagnosis not present

## 2023-04-16 DIAGNOSIS — L89151 Pressure ulcer of sacral region, stage 1: Secondary | ICD-10-CM | POA: Diagnosis not present

## 2023-04-16 DIAGNOSIS — E7800 Pure hypercholesterolemia, unspecified: Secondary | ICD-10-CM | POA: Diagnosis not present

## 2023-04-16 NOTE — Telephone Encounter (Signed)
Spoke to patients son and he apologized for his sisters behavior and ensured she would never come up here again. He did let me now they love it here and do not want to leave. I told son that we need POA papers and that either he or the caretaker would need to bring the patient in for appointments or call the office. There will be no more communication with the sister due to her giving [place] conflicting information and being disruptive. He understood and was grateful his dad could stay with [place]

## 2023-04-16 NOTE — Telephone Encounter (Signed)
Lmom for the son to call us back to make father appt  to see Dr Tat. Per Dr Tat you can offer the 04-20-23 visit if still open or next appt as long as it is not to far out.  Will try son back later

## 2023-04-18 DIAGNOSIS — I517 Cardiomegaly: Secondary | ICD-10-CM | POA: Diagnosis not present

## 2023-04-18 DIAGNOSIS — E872 Acidosis, unspecified: Secondary | ICD-10-CM | POA: Diagnosis present

## 2023-04-18 DIAGNOSIS — A419 Sepsis, unspecified organism: Secondary | ICD-10-CM | POA: Diagnosis not present

## 2023-04-18 DIAGNOSIS — R1310 Dysphagia, unspecified: Secondary | ICD-10-CM | POA: Diagnosis present

## 2023-04-18 DIAGNOSIS — Z7982 Long term (current) use of aspirin: Secondary | ICD-10-CM | POA: Diagnosis not present

## 2023-04-18 DIAGNOSIS — H9190 Unspecified hearing loss, unspecified ear: Secondary | ICD-10-CM | POA: Diagnosis present

## 2023-04-18 DIAGNOSIS — A4102 Sepsis due to Methicillin resistant Staphylococcus aureus: Secondary | ICD-10-CM | POA: Diagnosis present

## 2023-04-18 DIAGNOSIS — Z515 Encounter for palliative care: Secondary | ICD-10-CM | POA: Diagnosis not present

## 2023-04-18 DIAGNOSIS — R6520 Severe sepsis without septic shock: Secondary | ICD-10-CM | POA: Diagnosis present

## 2023-04-18 DIAGNOSIS — R918 Other nonspecific abnormal finding of lung field: Secondary | ICD-10-CM | POA: Diagnosis not present

## 2023-04-18 DIAGNOSIS — R404 Transient alteration of awareness: Secondary | ICD-10-CM | POA: Diagnosis not present

## 2023-04-18 DIAGNOSIS — G20A1 Parkinson's disease without dyskinesia, without mention of fluctuations: Secondary | ICD-10-CM | POA: Diagnosis present

## 2023-04-18 DIAGNOSIS — R0689 Other abnormalities of breathing: Secondary | ICD-10-CM | POA: Diagnosis not present

## 2023-04-18 DIAGNOSIS — Z79899 Other long term (current) drug therapy: Secondary | ICD-10-CM | POA: Diagnosis not present

## 2023-04-18 DIAGNOSIS — R0902 Hypoxemia: Secondary | ICD-10-CM | POA: Diagnosis not present

## 2023-04-18 DIAGNOSIS — F419 Anxiety disorder, unspecified: Secondary | ICD-10-CM | POA: Diagnosis present

## 2023-04-18 DIAGNOSIS — B9562 Methicillin resistant Staphylococcus aureus infection as the cause of diseases classified elsewhere: Secondary | ICD-10-CM | POA: Diagnosis not present

## 2023-04-18 DIAGNOSIS — E039 Hypothyroidism, unspecified: Secondary | ICD-10-CM | POA: Diagnosis present

## 2023-04-18 DIAGNOSIS — J168 Pneumonia due to other specified infectious organisms: Secondary | ICD-10-CM | POA: Diagnosis not present

## 2023-04-18 DIAGNOSIS — J189 Pneumonia, unspecified organism: Secondary | ICD-10-CM | POA: Diagnosis present

## 2023-04-18 DIAGNOSIS — I482 Chronic atrial fibrillation, unspecified: Secondary | ICD-10-CM | POA: Diagnosis present

## 2023-04-18 DIAGNOSIS — L89150 Pressure ulcer of sacral region, unstageable: Secondary | ICD-10-CM | POA: Diagnosis present

## 2023-04-18 DIAGNOSIS — I499 Cardiac arrhythmia, unspecified: Secondary | ICD-10-CM | POA: Diagnosis not present

## 2023-04-18 DIAGNOSIS — F32A Depression, unspecified: Secondary | ICD-10-CM | POA: Diagnosis present

## 2023-04-18 DIAGNOSIS — L89224 Pressure ulcer of left hip, stage 4: Secondary | ICD-10-CM | POA: Diagnosis present

## 2023-04-18 DIAGNOSIS — R7989 Other specified abnormal findings of blood chemistry: Secondary | ICD-10-CM | POA: Diagnosis present

## 2023-04-18 DIAGNOSIS — N179 Acute kidney failure, unspecified: Secondary | ICD-10-CM | POA: Diagnosis present

## 2023-04-18 DIAGNOSIS — I447 Left bundle-branch block, unspecified: Secondary | ICD-10-CM | POA: Diagnosis not present

## 2023-04-18 DIAGNOSIS — R7881 Bacteremia: Secondary | ICD-10-CM | POA: Diagnosis not present

## 2023-04-18 DIAGNOSIS — R4182 Altered mental status, unspecified: Secondary | ICD-10-CM | POA: Diagnosis not present

## 2023-04-18 DIAGNOSIS — Z66 Do not resuscitate: Secondary | ICD-10-CM | POA: Diagnosis not present

## 2023-04-18 DIAGNOSIS — G20B1 Parkinson's disease with dyskinesia, without mention of fluctuations: Secondary | ICD-10-CM | POA: Diagnosis not present

## 2023-04-18 DIAGNOSIS — R0602 Shortness of breath: Secondary | ICD-10-CM | POA: Diagnosis not present

## 2023-04-18 DIAGNOSIS — J9601 Acute respiratory failure with hypoxia: Secondary | ICD-10-CM | POA: Diagnosis present

## 2023-04-19 ENCOUNTER — Telehealth: Payer: Self-pay | Admitting: Neurology

## 2023-04-19 DIAGNOSIS — R4182 Altered mental status, unspecified: Secondary | ICD-10-CM | POA: Diagnosis not present

## 2023-04-19 DIAGNOSIS — B9562 Methicillin resistant Staphylococcus aureus infection as the cause of diseases classified elsewhere: Secondary | ICD-10-CM | POA: Diagnosis not present

## 2023-04-19 DIAGNOSIS — L89224 Pressure ulcer of left hip, stage 4: Secondary | ICD-10-CM | POA: Diagnosis not present

## 2023-04-19 DIAGNOSIS — N179 Acute kidney failure, unspecified: Secondary | ICD-10-CM | POA: Diagnosis not present

## 2023-04-19 DIAGNOSIS — J189 Pneumonia, unspecified organism: Secondary | ICD-10-CM | POA: Diagnosis not present

## 2023-04-19 DIAGNOSIS — I517 Cardiomegaly: Secondary | ICD-10-CM | POA: Diagnosis not present

## 2023-04-19 DIAGNOSIS — R7881 Bacteremia: Secondary | ICD-10-CM | POA: Diagnosis not present

## 2023-04-19 NOTE — Telephone Encounter (Signed)
Spoke with patient son Jamall to offer appt this week for Tuesday to Thursday. Patient son states that patient is in the hospital and will call back once he knows more about his father condition.

## 2023-04-20 DIAGNOSIS — N179 Acute kidney failure, unspecified: Secondary | ICD-10-CM | POA: Diagnosis not present

## 2023-04-20 DIAGNOSIS — J9601 Acute respiratory failure with hypoxia: Secondary | ICD-10-CM | POA: Diagnosis not present

## 2023-04-20 DIAGNOSIS — R6520 Severe sepsis without septic shock: Secondary | ICD-10-CM | POA: Diagnosis not present

## 2023-04-20 DIAGNOSIS — A419 Sepsis, unspecified organism: Secondary | ICD-10-CM | POA: Diagnosis not present

## 2023-04-20 DIAGNOSIS — J189 Pneumonia, unspecified organism: Secondary | ICD-10-CM | POA: Diagnosis not present

## 2023-04-21 DIAGNOSIS — N179 Acute kidney failure, unspecified: Secondary | ICD-10-CM | POA: Diagnosis not present

## 2023-04-21 DIAGNOSIS — A419 Sepsis, unspecified organism: Secondary | ICD-10-CM | POA: Diagnosis not present

## 2023-04-21 DIAGNOSIS — J189 Pneumonia, unspecified organism: Secondary | ICD-10-CM | POA: Diagnosis not present

## 2023-04-21 DIAGNOSIS — J9601 Acute respiratory failure with hypoxia: Secondary | ICD-10-CM | POA: Diagnosis not present

## 2023-04-21 DIAGNOSIS — G20B1 Parkinson's disease with dyskinesia, without mention of fluctuations: Secondary | ICD-10-CM | POA: Diagnosis not present

## 2023-04-21 DIAGNOSIS — R6520 Severe sepsis without septic shock: Secondary | ICD-10-CM | POA: Diagnosis not present

## 2023-04-30 DEATH — deceased
